# Patient Record
Sex: Female | Born: 1960 | Race: White | Hispanic: No | Marital: Single | Smoking: Never smoker
Health system: Southern US, Community
[De-identification: ages and names within clinical notes are randomized; demographics above are authoritative.]

## PROBLEM LIST (undated history)

## (undated) DIAGNOSIS — M1990 Unspecified osteoarthritis, unspecified site: Secondary | ICD-10-CM

## (undated) DIAGNOSIS — F32A Depression, unspecified: Secondary | ICD-10-CM

## (undated) DIAGNOSIS — M109 Gout, unspecified: Secondary | ICD-10-CM

## (undated) DIAGNOSIS — E039 Hypothyroidism, unspecified: Secondary | ICD-10-CM

## (undated) DIAGNOSIS — I10 Essential (primary) hypertension: Secondary | ICD-10-CM

## (undated) DIAGNOSIS — F419 Anxiety disorder, unspecified: Secondary | ICD-10-CM

## (undated) DIAGNOSIS — G4730 Sleep apnea, unspecified: Secondary | ICD-10-CM

## (undated) DIAGNOSIS — E7800 Pure hypercholesterolemia, unspecified: Secondary | ICD-10-CM

## (undated) DIAGNOSIS — D649 Anemia, unspecified: Secondary | ICD-10-CM

## (undated) DIAGNOSIS — M19049 Primary osteoarthritis, unspecified hand: Secondary | ICD-10-CM

## (undated) HISTORY — DX: Anemia, unspecified: D64.9

## (undated) HISTORY — DX: Sleep apnea, unspecified: G47.30

## (undated) HISTORY — PX: ABDOMINAL HYSTERECTOMY: SHX81

## (undated) HISTORY — DX: Depression, unspecified: F32.A

## (undated) HISTORY — DX: Unspecified osteoarthritis, unspecified site: M19.90

---

## 2001-08-16 ENCOUNTER — Encounter: Payer: Self-pay | Admitting: Emergency Medicine

## 2001-08-16 ENCOUNTER — Emergency Department (HOSPITAL_COMMUNITY): Admission: EM | Admit: 2001-08-16 | Discharge: 2001-08-16 | Payer: Self-pay | Admitting: Emergency Medicine

## 2014-08-16 ENCOUNTER — Emergency Department (HOSPITAL_COMMUNITY): Payer: 59

## 2014-08-16 ENCOUNTER — Emergency Department (HOSPITAL_COMMUNITY)
Admission: EM | Admit: 2014-08-16 | Discharge: 2014-08-16 | Disposition: A | Discharge status: Home / Self Care | Payer: 59 | Attending: Emergency Medicine | Admitting: Emergency Medicine

## 2014-08-16 ENCOUNTER — Encounter (HOSPITAL_COMMUNITY): Payer: Self-pay | Admitting: Emergency Medicine

## 2014-08-16 DIAGNOSIS — Y998 Other external cause status: Secondary | ICD-10-CM | POA: Diagnosis not present

## 2014-08-16 DIAGNOSIS — Y9389 Activity, other specified: Secondary | ICD-10-CM | POA: Insufficient documentation

## 2014-08-16 DIAGNOSIS — W2209XA Striking against other stationary object, initial encounter: Secondary | ICD-10-CM | POA: Diagnosis not present

## 2014-08-16 DIAGNOSIS — E782 Mixed hyperlipidemia: Secondary | ICD-10-CM | POA: Insufficient documentation

## 2014-08-16 DIAGNOSIS — S0990XA Unspecified injury of head, initial encounter: Secondary | ICD-10-CM | POA: Diagnosis not present

## 2014-08-16 DIAGNOSIS — Z79899 Other long term (current) drug therapy: Secondary | ICD-10-CM | POA: Diagnosis not present

## 2014-08-16 DIAGNOSIS — E860 Dehydration: Secondary | ICD-10-CM

## 2014-08-16 DIAGNOSIS — I10 Essential (primary) hypertension: Secondary | ICD-10-CM | POA: Insufficient documentation

## 2014-08-16 DIAGNOSIS — R55 Syncope and collapse: Secondary | ICD-10-CM

## 2014-08-16 DIAGNOSIS — Y9289 Other specified places as the place of occurrence of the external cause: Secondary | ICD-10-CM | POA: Diagnosis not present

## 2014-08-16 DIAGNOSIS — R197 Diarrhea, unspecified: Secondary | ICD-10-CM | POA: Insufficient documentation

## 2014-08-16 DIAGNOSIS — S0121XA Laceration without foreign body of nose, initial encounter: Secondary | ICD-10-CM | POA: Diagnosis not present

## 2014-08-16 DIAGNOSIS — W19XXXA Unspecified fall, initial encounter: Secondary | ICD-10-CM

## 2014-08-16 HISTORY — DX: Pure hypercholesterolemia, unspecified: E78.00

## 2014-08-16 HISTORY — DX: Essential (primary) hypertension: I10

## 2014-08-16 LAB — CBC WITH DIFFERENTIAL/PLATELET
BASOS PCT: 0 % (ref 0–1)
Basophils Absolute: 0 10*3/uL (ref 0.0–0.1)
EOS ABS: 0 10*3/uL (ref 0.0–0.7)
Eosinophils Relative: 0 % (ref 0–5)
HCT: 40.9 % (ref 36.0–46.0)
HEMOGLOBIN: 13.5 g/dL (ref 12.0–15.0)
LYMPHS ABS: 1.4 10*3/uL (ref 0.7–4.0)
Lymphocytes Relative: 14 % (ref 12–46)
MCH: 28.8 pg (ref 26.0–34.0)
MCHC: 33 g/dL (ref 30.0–36.0)
MCV: 87.2 fL (ref 78.0–100.0)
MONO ABS: 0.6 10*3/uL (ref 0.1–1.0)
MONOS PCT: 6 % (ref 3–12)
NEUTROS PCT: 80 % — AB (ref 43–77)
Neutro Abs: 7.6 10*3/uL (ref 1.7–7.7)
Platelets: 203 10*3/uL (ref 150–400)
RBC: 4.69 MIL/uL (ref 3.87–5.11)
RDW: 15.5 % (ref 11.5–15.5)
WBC: 9.6 10*3/uL (ref 4.0–10.5)

## 2014-08-16 LAB — COMPREHENSIVE METABOLIC PANEL
ALBUMIN: 3.8 g/dL (ref 3.5–5.0)
ALK PHOS: 58 U/L (ref 38–126)
ALT: 38 U/L (ref 14–54)
AST: 60 U/L — AB (ref 15–41)
Anion gap: 9 (ref 5–15)
BILIRUBIN TOTAL: 0.7 mg/dL (ref 0.3–1.2)
BUN: 8 mg/dL (ref 6–20)
CHLORIDE: 106 mmol/L (ref 101–111)
CO2: 27 mmol/L (ref 22–32)
Calcium: 9.1 mg/dL (ref 8.9–10.3)
Creatinine, Ser: 1.02 mg/dL — ABNORMAL HIGH (ref 0.44–1.00)
GFR calc Af Amer: >60 mL/min (ref >60)
Glucose, Bld: 110 mg/dL — ABNORMAL HIGH (ref 65–99)
POTASSIUM: 3.4 mmol/L — AB (ref 3.5–5.1)
SODIUM: 142 mmol/L (ref 135–145)
Total Protein: 6.1 g/dL — ABNORMAL LOW (ref 6.5–8.1)

## 2014-08-16 LAB — TROPONIN I: Troponin I: <0.03 ng/mL (ref <0.031)

## 2014-08-16 MED ORDER — SODIUM CHLORIDE 0.9 % IV SOLN: Freq: Once | INTRAVENOUS | Status: DC

## 2014-08-16 MED ORDER — MORPHINE SULFATE 4 MG/ML IJ SOLN
4.0000 mg | Freq: Once | INTRAMUSCULAR | Status: AC
  Administered 2014-08-16: 4 mg via INTRAVENOUS
  Filled 2014-08-16: qty 1

## 2014-08-16 MED ORDER — SODIUM CHLORIDE 0.9 % IV BOLUS (SEPSIS)
1000.0000 mL | Freq: Once | INTRAVENOUS | Status: AC
  Administered 2014-08-16: 1000 mL via INTRAVENOUS

## 2014-08-16 MED ORDER — HYDROCODONE-ACETAMINOPHEN 5-325 MG PO TABS: 1.0000 | ORAL_TABLET | ORAL | Status: DC | PRN

## 2014-08-16 MED ORDER — MORPHINE SULFATE 4 MG/ML IJ SOLN
4.0000 mg | INTRAMUSCULAR | Status: DC | PRN
  Administered 2014-08-16: 4 mg via INTRAVENOUS
  Filled 2014-08-16: qty 1

## 2014-08-16 MED ORDER — LIDOCAINE HCL (PF) 1 % IJ SOLN
30.0000 mL | Freq: Once | INTRAMUSCULAR | Status: AC
  Administered 2014-08-16: 30 mL
  Filled 2014-08-16: qty 30

## 2014-08-16 MED ORDER — ONDANSETRON HCL 4 MG/2ML IJ SOLN
4.0000 mg | Freq: Once | INTRAMUSCULAR | Status: AC
  Administered 2014-08-16: 4 mg via INTRAVENOUS
  Filled 2014-08-16: qty 2

## 2014-08-16 MED ORDER — ONDANSETRON 4 MG PO TBDP: 4.0000 mg | ORAL_TABLET | Freq: Three times a day (TID) | ORAL | Status: DC | PRN

## 2014-08-16 NOTE — ED Notes (Signed)
Received pt from home via ems with c/o been taking fenteramine for several weeks and loss 45 lbs. Pt has history of HTN but while taking this medication she began getting dizzy and was told by her doctor to stop taking her BP meds. Pt stopped taking her BP meds x 1 week. Pt has been experiencing diarrhea, abd cramping, dizziness, N/V. Today after getting up and going to the bathroom for a second time with diarrhea pt had a syncopal episode. Pt landed face down causing lac to her nose. Pt EKG for EMS showed T wave inversion.

## 2014-08-16 NOTE — ED Provider Notes (Signed)
CSN: [id number]     Arrival date & time 08/16/14  0706 History   First MD Initiated Contact with Patient 08/16/14 0719     Chief Complaint  Patient presents with  . Loss of Consciousness  . Emesis  . Head Injury      HPI  Patient presents for evaluation after a syncopal episode. Was getting up to the restroom for the second time this morning. Felt dizzy and lightheaded. Has felt this way intermittently for the last several days. Was on the toilet. Felt hot and flushed. She felt like she was going to pass out. She stood and tried to walk back to her bedroom. She fell in the hallway. Struck her face against a stationary box. Has an extensive nasal laceration. Husband heard the sound of her hitting the floor. He was immediately at her side. He states that it took her a few seconds to "realize what happened". However no frank ongoing loss of consciousness.  She has been on a diet for 12 weeks and on phentermine for 2 courses in the last 12 weeks. When on a diet of small circular protein and sponsoring and vegetables twice a day. The blood leg in the evening, magnesium oxide at night along with a "Colon Flushing". She states she has loose stools and abdominal cramping each morning. Has lost total 45 pounds.  She states that she had previous ability on antihypertensives, amlodipine. Her physician told her to check her blood pressures and that if they became lower she may not need it anymore with consideration for weight loss. Started feeling dizzy and lightheaded week ago. She stopped her amlodipine. However, she has not been checking blood pressures. Does not what her blood pressure has been running.  Past Medical History  Diagnosis Date  . Hypertension   . High cholesterol    Past Surgical History  Procedure Laterality Date  . Abdominal hysterectomy     No family history on file. History  Substance Use Topics  . Smoking status: Never Smoker   . Smokeless tobacco: Not on file  . Alcohol  Use: Yes   OB History    No data available     Review of Systems  Constitutional: Negative for fever, chills, diaphoresis, appetite change and fatigue.  HENT: Negative for mouth sores, sore throat and trouble swallowing.        Nasal laceration  Eyes: Negative for visual disturbance.  Respiratory: Negative for cough, chest tightness, shortness of breath and wheezing.   Cardiovascular: Negative for chest pain.  Gastrointestinal: Positive for nausea and diarrhea. Negative for vomiting, abdominal pain and abdominal distention.  Endocrine: Negative for polydipsia, polyphagia and polyuria.  Genitourinary: Negative for dysuria, frequency and hematuria.  Musculoskeletal: Negative for gait problem.  Skin: Positive for wound. Negative for color change, pallor and rash.  Neurological: Positive for dizziness, syncope and weakness. Negative for light-headedness and headaches.  Hematological: Does not bruise/bleed easily.  Psychiatric/Behavioral: Negative for behavioral problems and confusion.      Allergies  Review of patient's allergies indicates no known allergies.  Home Medications   Prior to Admission medications   Medication Sig Start Date End Date Taking? Authorizing Provider  atorvastatin (LIPITOR) 20 MG tablet Take 20 mg by mouth daily.   Yes Historical Provider, MD  escitalopram (LEXAPRO) 10 MG tablet Take 10 mg by mouth daily.   Yes Historical Provider, MD  magnesium oxide (MAG-OX) 400 MG tablet Take 400 mg by mouth daily.   Yes Historical Provider, MD  phentermine 37.5 MG capsule Take 37.5 mg by mouth every morning.   Yes Historical Provider, MD  HYDROcodone-acetaminophen (NORCO/VICODIN) 5-325 MG per tablet Take 1 tablet by mouth every 4 (four) hours as needed. 08/16/14   Tanna Furry, MD  ondansetron (ZOFRAN ODT) 4 MG disintegrating tablet Take 1 tablet (4 mg total) by mouth every 8 (eight) hours as needed for nausea. 08/16/14   Tanna Furry, MD   BP 121/89 mmHg  Pulse 94   Temp(Src) 97.5 F (36.4 C) (Oral)  Resp 22  Ht 5\' 3"  (1.6 m)  Wt 160 lb (72.576 kg)  BMI 28.35 kg/m2  SpO2 99% Physical Exam  Constitutional: She is oriented to person, place, and time. She appears well-developed and well-nourished. No distress. Cervical collar in place.  HENT:  Head: Normocephalic.  Nose:    No facial deformity. Nasal bones evident in the base of this nasal laceration. No proptosis or enophthalmos. Normal ocular movements. Normal V1 to V3 sensation bilaterally. No malocclusion or dental trauma.  Eyes: Conjunctivae are normal. Pupils are equal, round, and reactive to light. No scleral icterus.  Neck: Normal range of motion. Neck supple. No thyromegaly present.  No midline cervical spine tenderness.  Cardiovascular: Normal rate and regular rhythm.  Exam reveals no gallop and no friction rub.   No murmur heard. Regular rate and rhythm. Sinus rhythm on the monitor.  Pulmonary/Chest: Effort normal and breath sounds normal. No respiratory distress. She has no wheezes. She has no rales.  Abdominal: Soft. Bowel sounds are normal. She exhibits no distension. There is no tenderness. There is no rebound.  Musculoskeletal: Normal range of motion.  Neurological: She is alert and oriented to person, place, and time.  Normal neurological examination.  Skin: Skin is warm and dry. No rash noted.  Psychiatric: She has a normal mood and affect. Her behavior is normal.    ED Course  Procedures (including critical care time) Labs Review Labs Reviewed  CBC WITH DIFFERENTIAL/PLATELET - Abnormal; Notable for the following:    Neutrophils Relative % 80 (*)    All other components within normal limits  COMPREHENSIVE METABOLIC PANEL - Abnormal; Notable for the following:    Potassium 3.4 (*)    Glucose, Bld 110 (*)    Creatinine, Ser 1.02 (*)    Total Protein 6.1 (*)    AST 60 (*)    All other components within normal limits  TROPONIN I    Imaging Review Ct Head Wo  Contrast  08/16/2014   CLINICAL DATA:  Syncope.  Fall and landed on face  EXAM: CT HEAD WITHOUT CONTRAST  CT MAXILLOFACIAL WITHOUT CONTRAST  TECHNIQUE: Multidetector CT imaging of the head and maxillofacial structures were performed using the standard protocol without intravenous contrast. Multiplanar CT image reconstructions of the maxillofacial structures were also generated.  COMPARISON:  None.  FINDINGS: CT HEAD FINDINGS  There is prominence of the sulci. The ventricular volumes are normal. The cerebral and cerebellar hemispheres are normal. No evidence for acute intracranial hemorrhage, cortical infarct or mass. No abnormal extra-axial fluid collections. The paranasal sinuses are clear. The mastoid air cells are clear the calvarium is intact.  CT MAXILLOFACIAL FINDINGS  The paranasal sinuses are clear. The mandible appears intact. No F no evidence for nasal bone fracture. The nasal septum is midline. The orbits are intact. No blow-out fracture.  IMPRESSION: 1. No evidence for acute intracranial abnormality. 2. No facial bone injury identified.   Electronically Signed   By: Queen Slough.D.  On: 08/16/2014 08:53   Ct Cervical Spine Wo Contrast  08/16/2014   CLINICAL DATA:  54 year old female status post syncope and fall  EXAM: CT CERVICAL SPINE WITHOUT CONTRAST  TECHNIQUE: Multidetector CT imaging of the cervical spine was performed without intravenous contrast. Multiplanar CT image reconstructions were also generated.  COMPARISON:  Concurrently obtained radiographs of the head and face  FINDINGS: No acute fracture, malalignment or prevertebral soft tissue swelling. Left C2-C3 facet arthropathy. Mild facet arthropathy also present on the left at T1-T2. Mild cervical spondylosis with posterior disc osteophyte complexes at C5-C6 and C6-C7. Unremarkable CT appearance of the thyroid gland. No acute soft tissue abnormality. The lung apices are unremarkable.  IMPRESSION: 1. No acute fracture or malalignment.  2. Mild multilevel cervical spondylosis and left-sided facet arthropathy as above.   Electronically Signed   By: Jacqulynn Cadet M.D.   On: 08/16/2014 08:56   Ct Maxillofacial Wo Cm  08/16/2014   CLINICAL DATA:  Syncope.  Fall and landed on face  EXAM: CT HEAD WITHOUT CONTRAST  CT MAXILLOFACIAL WITHOUT CONTRAST  TECHNIQUE: Multidetector CT imaging of the head and maxillofacial structures were performed using the standard protocol without intravenous contrast. Multiplanar CT image reconstructions of the maxillofacial structures were also generated.  COMPARISON:  None.  FINDINGS: CT HEAD FINDINGS  There is prominence of the sulci. The ventricular volumes are normal. The cerebral and cerebellar hemispheres are normal. No evidence for acute intracranial hemorrhage, cortical infarct or mass. No abnormal extra-axial fluid collections. The paranasal sinuses are clear. The mastoid air cells are clear the calvarium is intact.  CT MAXILLOFACIAL FINDINGS  The paranasal sinuses are clear. The mandible appears intact. No F no evidence for nasal bone fracture. The nasal septum is midline. The orbits are intact. No blow-out fracture.  IMPRESSION: 1. No evidence for acute intracranial abnormality. 2. No facial bone injury identified.   Electronically Signed   By: Kerby Moors M.D.   On: 08/16/2014 08:53     EKG Interpretation   Date/Time:  Sunday August 16 2014 07:12:30 EDT Ventricular Rate:  57 PR Interval:  157 QRS Duration: 89 QT Interval:  505 QTC Calculation: 492 R Axis:   36 Text Interpretation:  Sinus rhythm Baseline wander in lead(s) V2 T  inversion V1-V3 Confirmed by Jeneen Rinks  MD, [place] ([id number]) on 08/16/2014 8:07:31  AM      MDM   Final diagnoses:  Fall  Nasal laceration, initial encounter   Her syncope is likely multifactorial. With the diarrhea daily over expect some degree of volume depletion. With the symptoms coming on with diarrhea and on the toilet this is likely micturition/vagal syncope.  Plan is hydration. Metabolic evaluation. Imaging. Will need nasal laceration repair.   LACERATION REPAIR Performed by: Lolita Patella Authorized by: Lolita Patella Consent: Verbal consent obtained. Risks and benefits: risks, benefits and alternatives were discussed Consent given by: patient Patient identity confirmed: provided demographic data Prepped and Draped in normal sterile fashion Wound explored  Laceration Location: nose  Laceration Length: 4cm  No Foreign Bodies seen or palpated  Anesthesia: local infiltration  Local anesthetic: lidocaine 1% c epinephrine  Anesthetic total: 4 ml  Irrigation method: syringe Amount of cleaning: standard  Skin closure: 5-0 Prolene  Number of sutures: 10  Technique: simple  Patient tolerance: Patient tolerated the procedure well with no immediate complications.  11:40:  After fluids patient feeling better. Ambulatory into the restroom a few times. X-ray showed no fractures or acute findings. Laceration repaired.  After recheck nasal tip and cartilage appear pink with excellent refill. I discussed gas the case with Dr. Buelah Manis of oral surgery. Ranges made for follow-up tomorrow as needed, or in 5-7 days for suture removal.     Tanna Furry, MD 08/20/14 [phone]

## 2014-08-16 NOTE — Discharge Instructions (Signed)
Call Dr. Buelah Manis for an outpatient appointment.  If your nose still appears pink and normal, call tomorrow to make an appointment on Friday or next Monday for suture removal.  If tomorrow, your nose appears white, pale, or dusky blue call Dr. Buelah Manis for an appointment tomorrow.  Do not use your supplements or bowel cleanse tonight.  Rest and stay hydrated.

## 2014-08-16 NOTE — ED Notes (Signed)
Family at bedside. 

## 2014-08-16 NOTE — ED Notes (Signed)
Suture cart at bedside 

## 2014-08-16 NOTE — ED Notes (Signed)
Patient transported to CT 

## 2015-08-29 IMAGING — CT CT MAXILLOFACIAL W/O CM
3 series · 16 of 47 positions shown, 19 images · non-contrast
Comparison: None.

CLINICAL DATA: Syncope.  Fall and landed on face

EXAM:
CT HEAD WITHOUT CONTRAST
CT MAXILLOFACIAL WITHOUT CONTRAST
TECHNIQUE: Multidetector CT imaging of the head and maxillofacial structures
were performed using the standard protocol without intravenous
contrast. Multiplanar CT image reconstructions of the maxillofacial
structures were also generated.

[Series 2: facial/ orbits 2.0 h30s · axial · 0.32mm/px · z∈[+193,+331]mm · 10 of 81 slices shown, 13 images]
[im 6/81  brain]
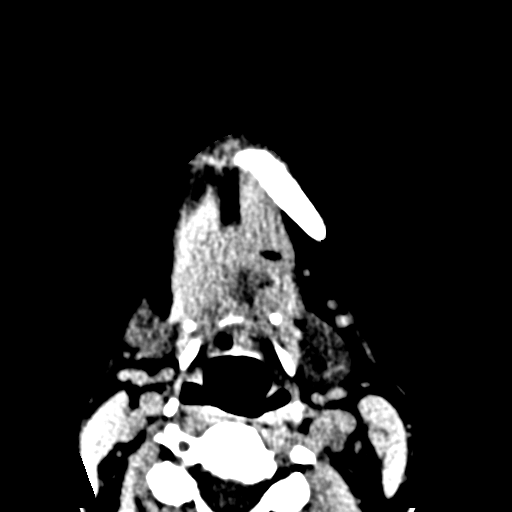
[im 6/81  bone]
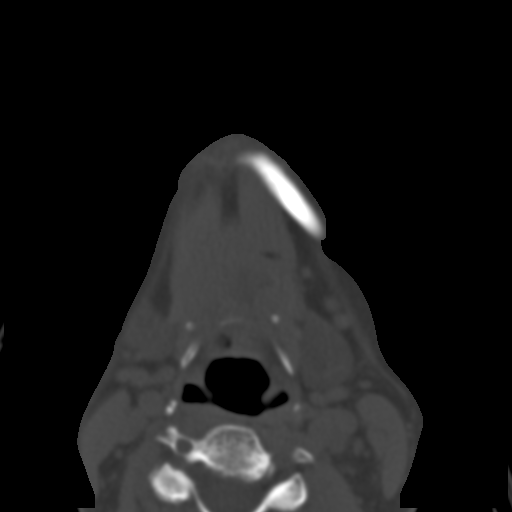
[im 14/81  bone]
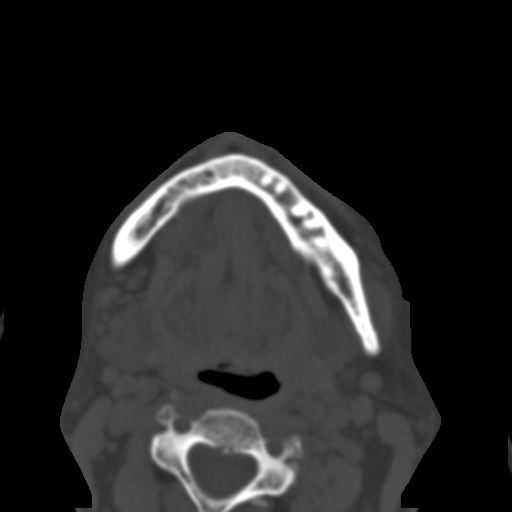
[im 23/81  bone]
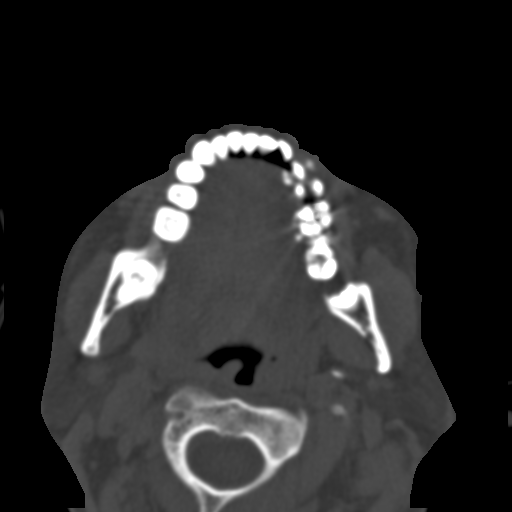
[im 28/81  bone]
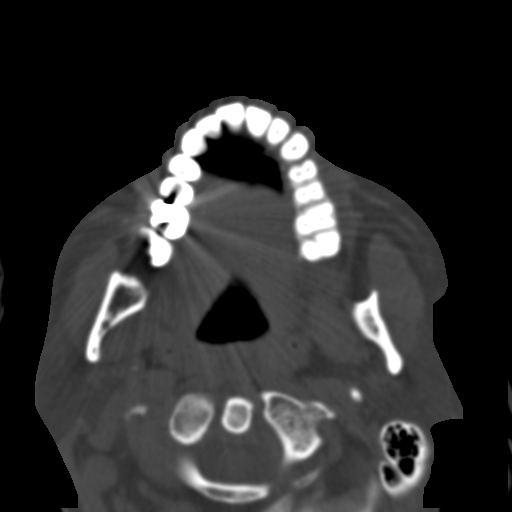
[im 36/81  brain]
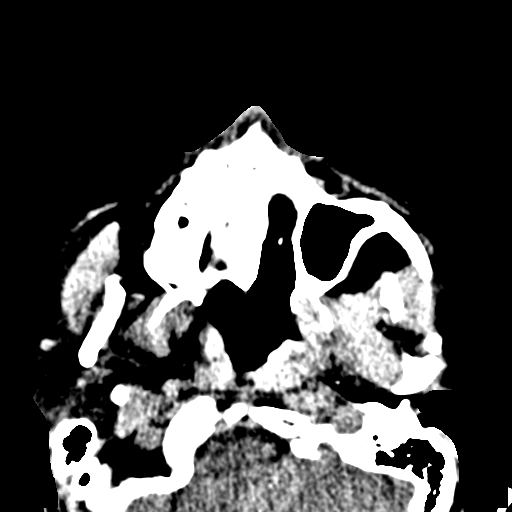
[im 36/81  bone]
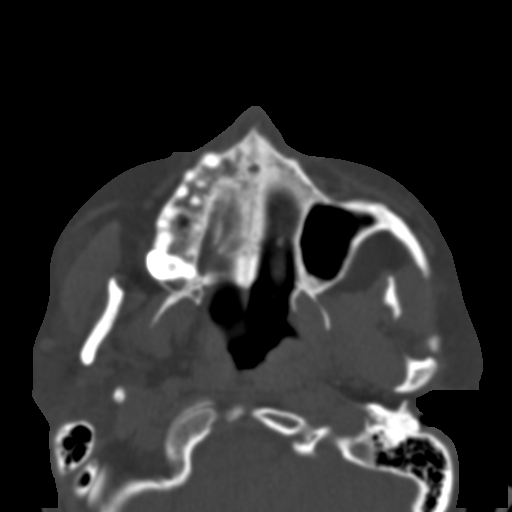
[im 45/81  bone]
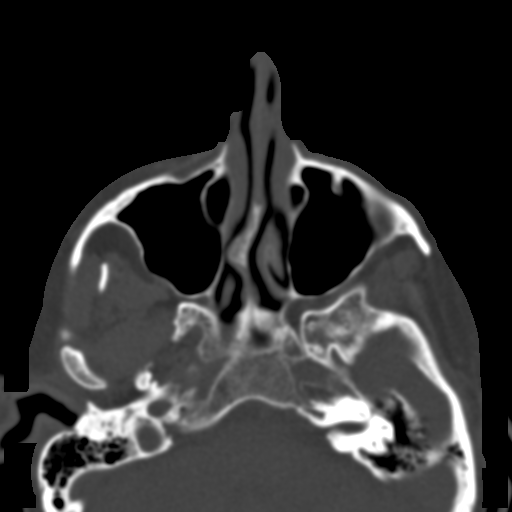
[im 53/81  bone]
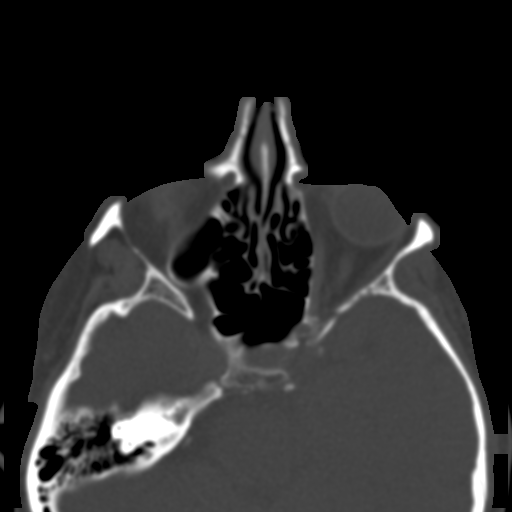
[im 61/81  bone]
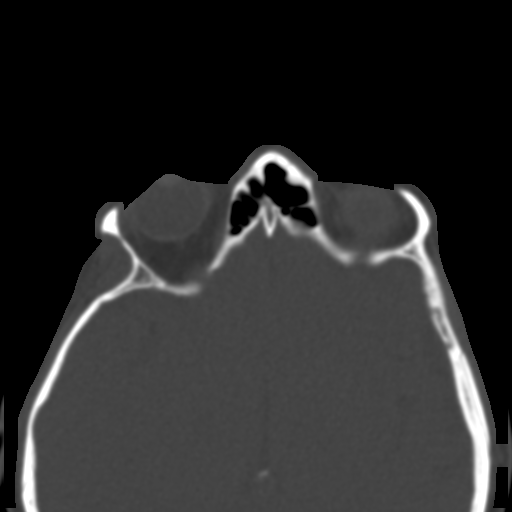
[im 67/81  brain]
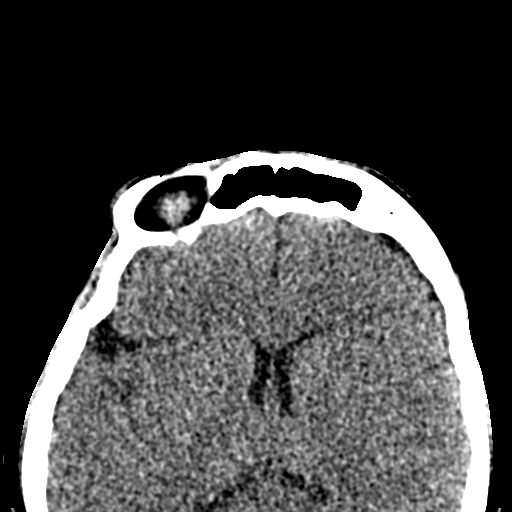
[im 67/81  bone]
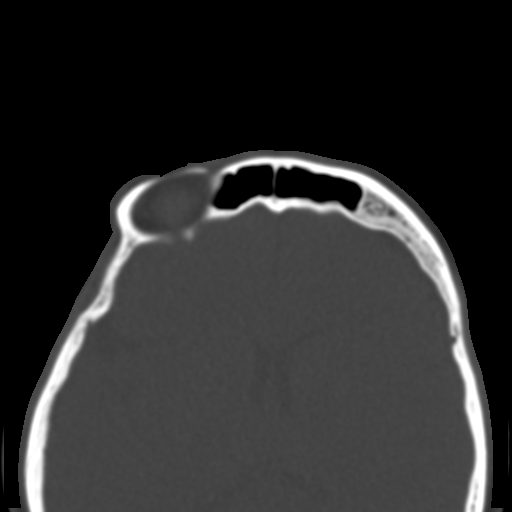
[im 75/81  bone]
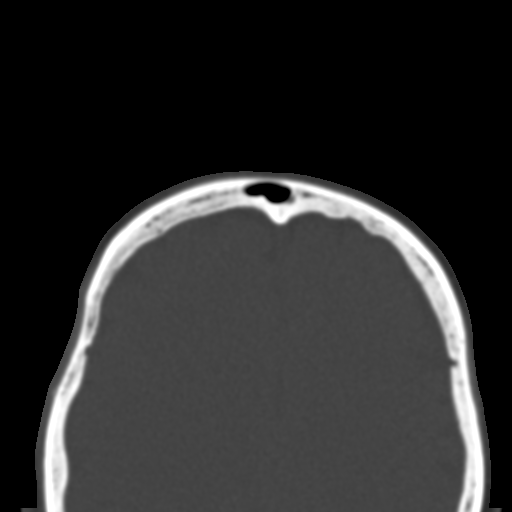

[Series 6: coronal soft tissue · coronal · 0.36mm/px · 3 of 74 slices shown]
[im 25/74  bone]
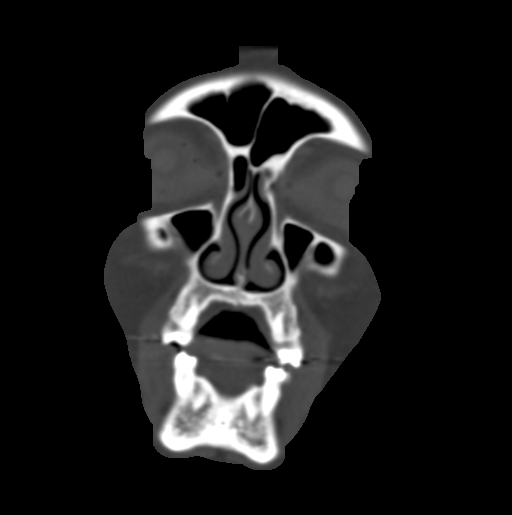
[im 33/74  bone]
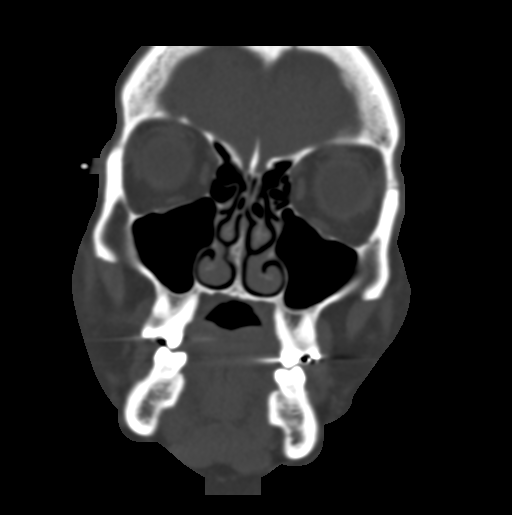
[im 41/74  bone]
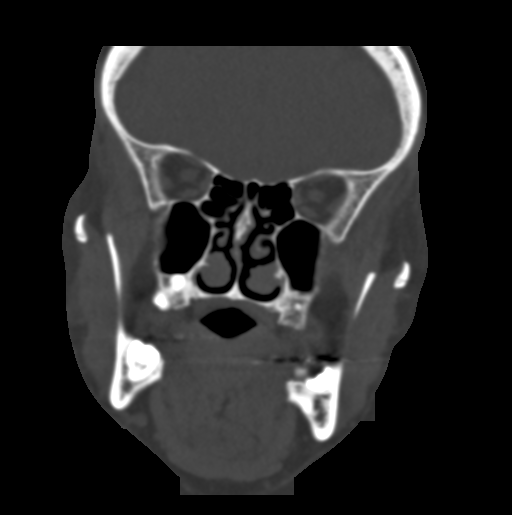

[Series 7: sagittal soft tissue · sagittal · 0.35mm/px · 3 of 76 slices shown]
[im 26/76  bone]
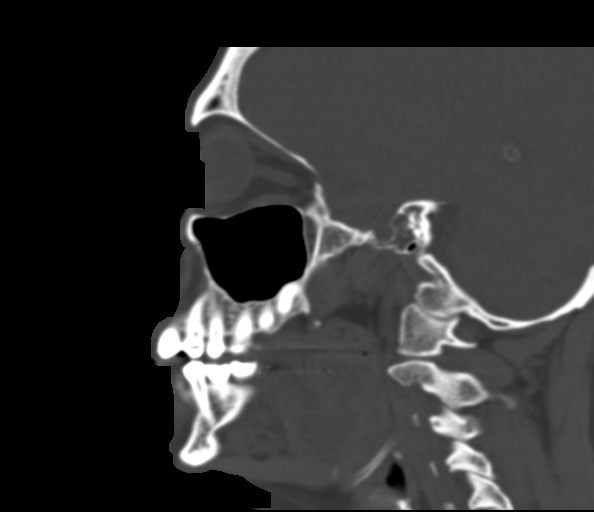
[im 38/76  bone]
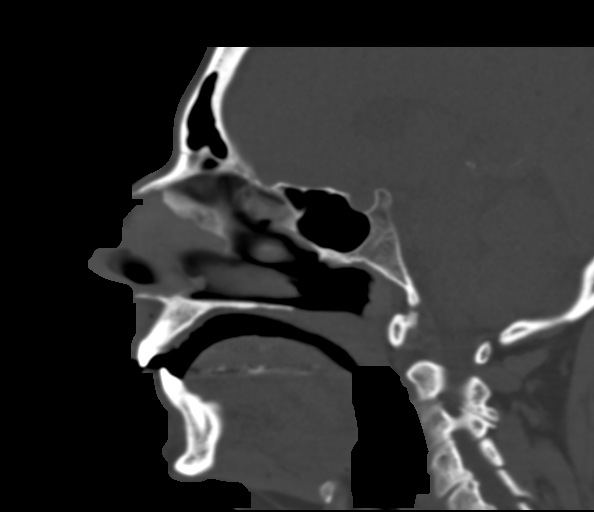
[im 51/76  bone]
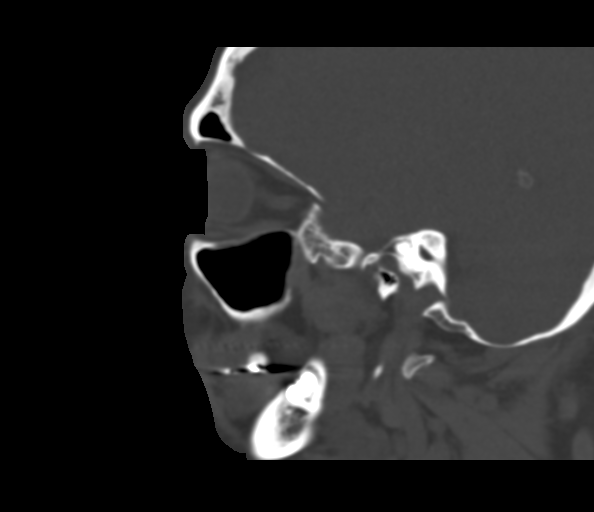

[16 of 47 positions shown; findings below may reference images not displayed]

FINDINGS: CT HEAD FINDINGS

There is prominence of the sulci. The ventricular volumes are
normal. The cerebral and cerebellar hemispheres are normal. No
evidence for acute intracranial hemorrhage, cortical infarct or
mass. No abnormal extra-axial fluid collections. The paranasal
sinuses are clear. The mastoid air cells are clear the calvarium is
intact.

CT MAXILLOFACIAL FINDINGS

The paranasal sinuses are clear. The mandible appears intact. No F
no evidence for nasal bone fracture. The nasal septum is midline.
The orbits are intact. No blow-out fracture.
IMPRESSION: 1. No evidence for acute intracranial abnormality.
2. No facial bone injury identified.

## 2015-08-29 IMAGING — CT CT CERVICAL SPINE W/O CM
3 of 4 series · 13 of 33 positions shown, 16 images · non-contrast
Comparison: Concurrently obtained radiographs of the head and face

CLINICAL DATA: 54-year-old female status post syncope and fall

EXAM:
CT CERVICAL SPINE WITHOUT CONTRAST
TECHNIQUE: Multidetector CT imaging of the cervical spine was performed without
intravenous contrast. Multiplanar CT image reconstructions were also
generated.

[Series 3: c_spine 2.0 i40s 3 · axial · 0.27mm/px · z∈[+147,+249]mm · 5 of 77 slices shown, 7 images]
[im 13/77  soft-tissue]
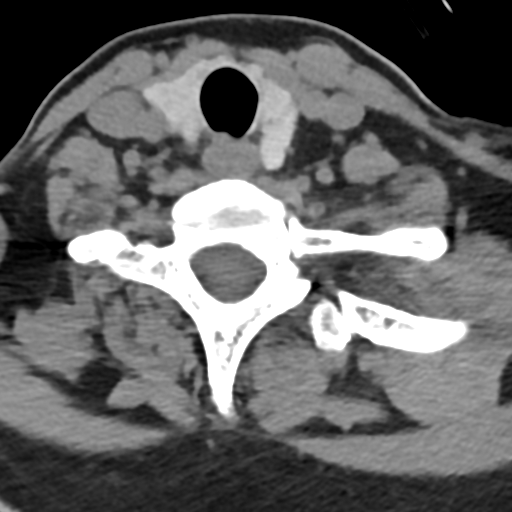
[im 13/77  bone]
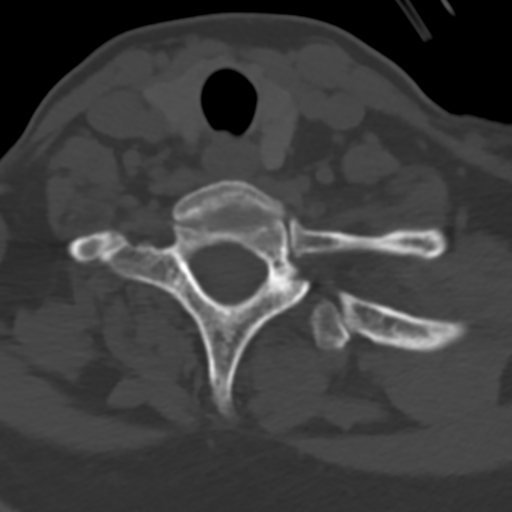
[im 26/77  bone]
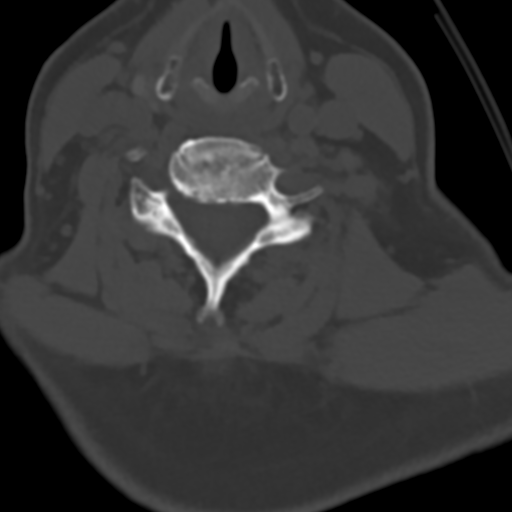
[im 39/77  bone]
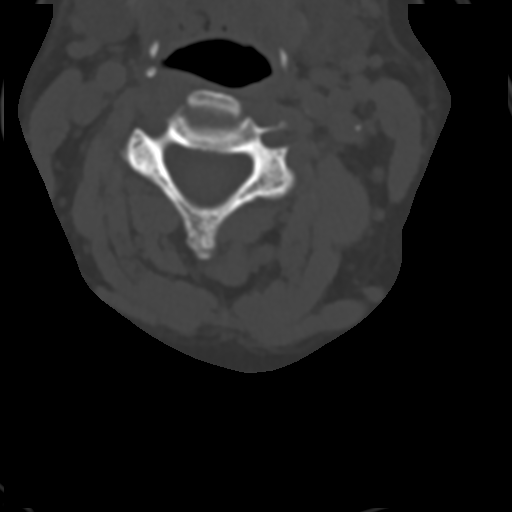
[im 51/77  bone]
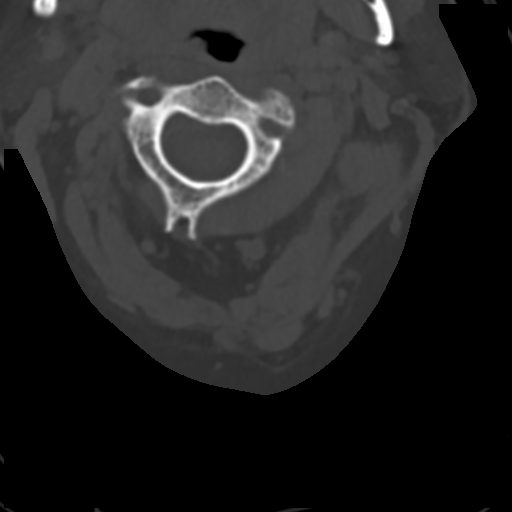
[im 64/77  soft-tissue]
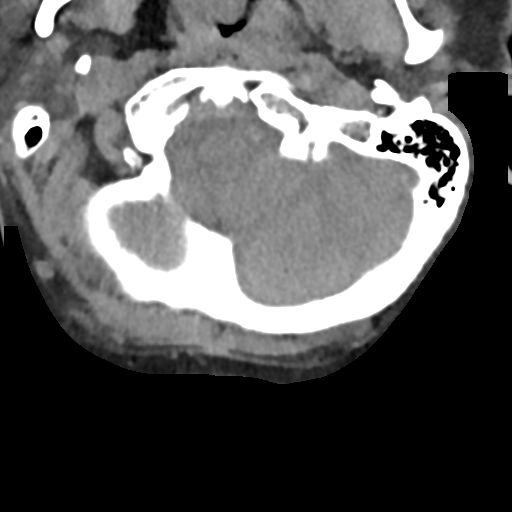
[im 64/77  bone]
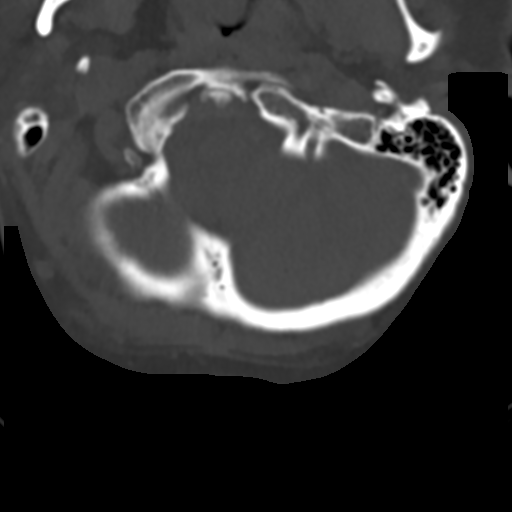

[Series 5: coronals · coronal · 0.28mm/px · 3 of 37 slices shown]
[im 8/37  bone]
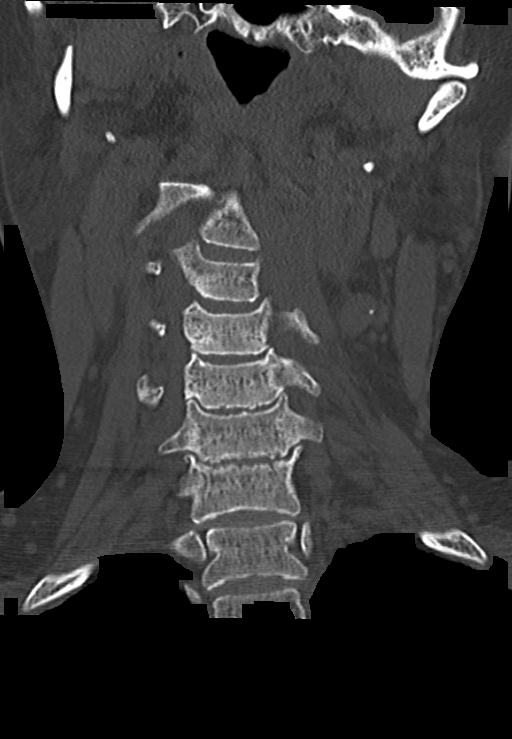
[im 15/37  bone]
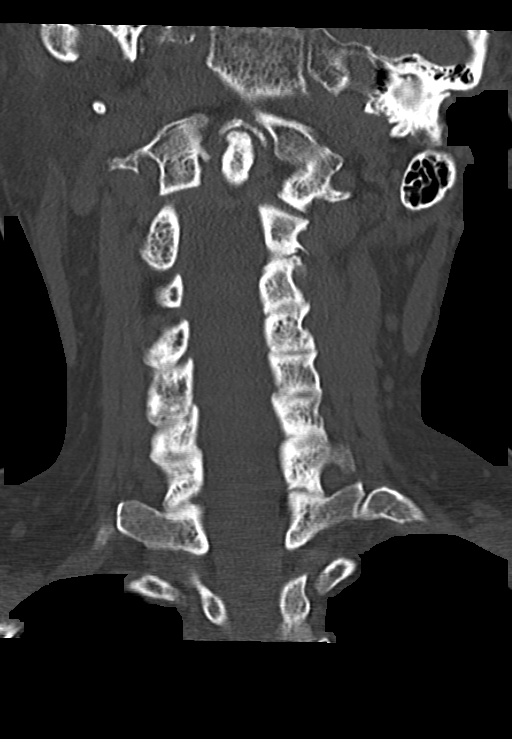
[im 22/37  bone]
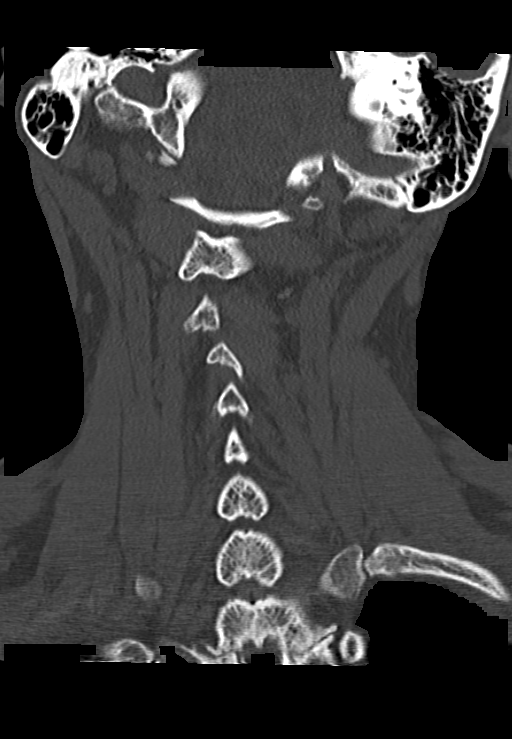

[Series 6: sagittals · sagittal · 0.30mm/px · 5 of 44 slices shown, 6 images]
[im 15/44  bone]
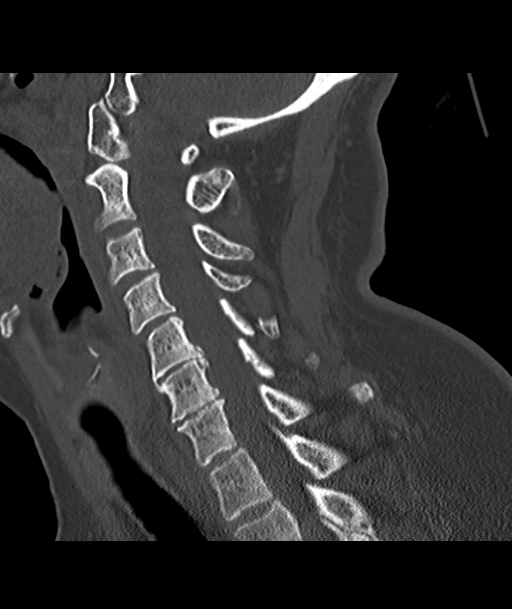
[im 18/44  bone]
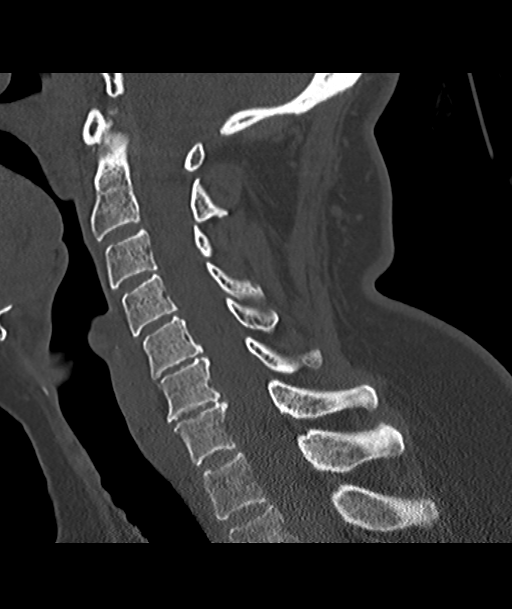
[im 22/44  soft-tissue]
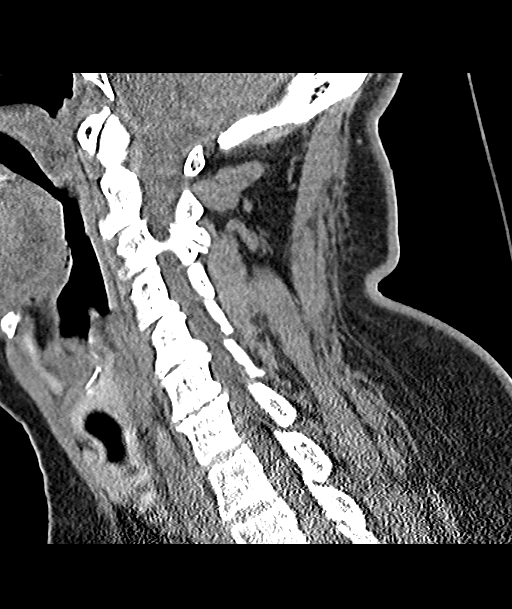
[im 22/44  bone]
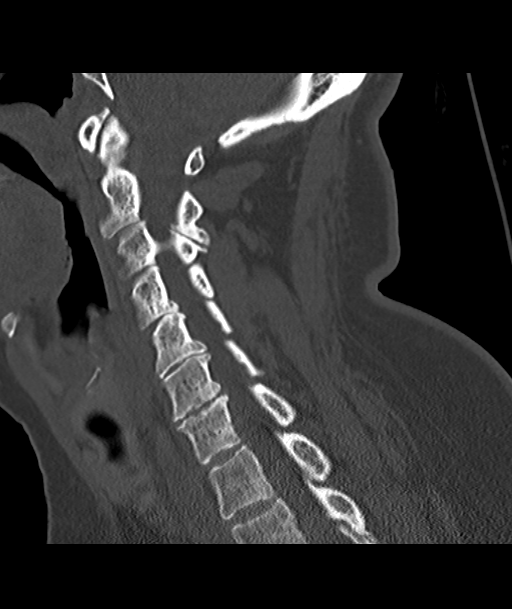
[im 26/44  bone]
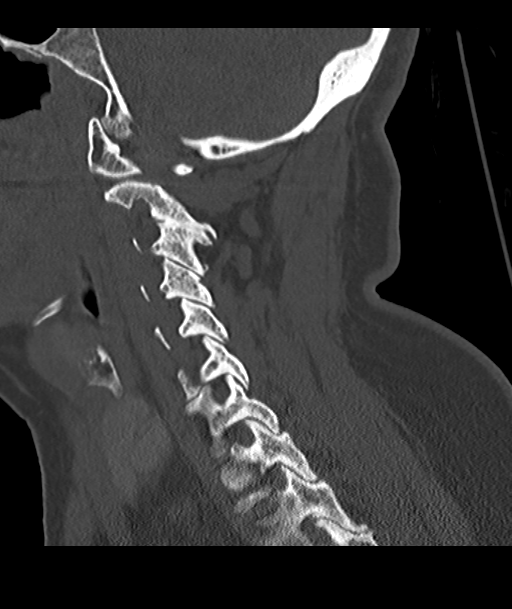
[im 29/44  bone]
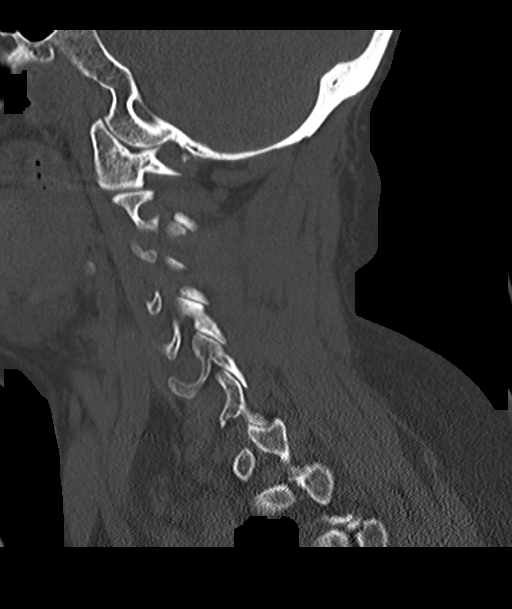

[13 of 33 positions shown; findings below may reference images not displayed]

FINDINGS: No acute fracture, malalignment or prevertebral soft tissue
swelling. Left C2-C3 facet arthropathy. Mild facet arthropathy also
present on the left at T1-T2. Mild cervical spondylosis with
posterior disc osteophyte complexes at C5-C6 and C6-C7. Unremarkable
CT appearance of the thyroid gland. No acute soft tissue
abnormality. The lung apices are unremarkable.
IMPRESSION: 1. No acute fracture or malalignment.
2. Mild multilevel cervical spondylosis and left-sided facet
arthropathy as above.

## 2017-01-30 HISTORY — PX: OTHER SURGICAL HISTORY: SHX169

## 2017-08-17 DIAGNOSIS — G5602 Carpal tunnel syndrome, left upper limb: Secondary | ICD-10-CM | POA: Insufficient documentation

## 2018-04-10 DIAGNOSIS — E785 Hyperlipidemia, unspecified: Secondary | ICD-10-CM | POA: Insufficient documentation

## 2018-04-10 DIAGNOSIS — F411 Generalized anxiety disorder: Secondary | ICD-10-CM | POA: Insufficient documentation

## 2018-09-25 DIAGNOSIS — M1812 Unilateral primary osteoarthritis of first carpometacarpal joint, left hand: Secondary | ICD-10-CM | POA: Insufficient documentation

## 2019-01-02 DIAGNOSIS — Z1321 Encounter for screening for nutritional disorder: Secondary | ICD-10-CM | POA: Insufficient documentation

## 2019-01-02 DIAGNOSIS — E538 Deficiency of other specified B group vitamins: Secondary | ICD-10-CM | POA: Insufficient documentation

## 2019-01-02 DIAGNOSIS — R7309 Other abnormal glucose: Secondary | ICD-10-CM | POA: Insufficient documentation

## 2020-02-05 ENCOUNTER — Other Ambulatory Visit: Payer: Self-pay

## 2020-02-05 ENCOUNTER — Ambulatory Visit (INDEPENDENT_AMBULATORY_CARE_PROVIDER_SITE_OTHER): Payer: 59 | Admitting: Family Medicine

## 2020-02-05 ENCOUNTER — Encounter: Payer: Self-pay | Admitting: Family Medicine

## 2020-02-05 VITALS — BP 162/82 | HR 72 | Ht 63.0 in | Wt 233.0 lb

## 2020-02-05 DIAGNOSIS — M79642 Pain in left hand: Secondary | ICD-10-CM

## 2020-02-05 DIAGNOSIS — F419 Anxiety disorder, unspecified: Secondary | ICD-10-CM

## 2020-02-05 DIAGNOSIS — E039 Hypothyroidism, unspecified: Secondary | ICD-10-CM

## 2020-02-05 DIAGNOSIS — I159 Secondary hypertension, unspecified: Secondary | ICD-10-CM

## 2020-02-05 DIAGNOSIS — E539 Vitamin B deficiency, unspecified: Secondary | ICD-10-CM

## 2020-02-05 DIAGNOSIS — F32A Depression, unspecified: Secondary | ICD-10-CM

## 2020-02-05 DIAGNOSIS — M109 Gout, unspecified: Secondary | ICD-10-CM

## 2020-02-05 DIAGNOSIS — Z808 Family history of malignant neoplasm of other organs or systems: Secondary | ICD-10-CM

## 2020-02-05 HISTORY — DX: Secondary hypertension, unspecified: I15.9

## 2020-02-05 MED ORDER — VENLAFAXINE HCL ER 150 MG PO CP24: 150.0000 mg | ORAL_CAPSULE | Freq: Every day | ORAL | 1 refills | Status: DC

## 2020-02-05 MED ORDER — CYANOCOBALAMIN 1000 MCG/ML IJ SOLN: 1000.0000 ug | Freq: Once | INTRAMUSCULAR | 0 refills | Status: AC

## 2020-02-05 NOTE — Progress Notes (Addendum)
New Patient Office Visit  Subjective:  Patient ID: Danielle Jones, female    DOB: August 06, 1960  Age: 60 y.o. MRN: [id number]  CC:  Chief Complaint  Patient presents with  . Establish Care    HPI Danielle Jones presents for chronic medical  Problems: HTN -not checking  on a regular basis-takesOlmesartan/Amlodipine/HCTZ-no recent blood work  Hypothyroid-taking levothyroxine-at current dose 79mg-no recent level  Gout-recent diagnosis-no recent flare-started taking allopurinol 1 weeks ago  Vit B deficiency-pt has taken 2 injections since diagnosis in December  Anxiety-long term use of Effexor ER 785m-no recent dosage change  Hand pain-gabapentin 3001mID, meloxicam 67m13mD-followed by ortho   Mammogram -scheduled for 02/06/20, no bone density in the past Past Medical History:  Diagnosis Date  . Depression   . High cholesterol   . Hypertension     Past Surgical History:  Procedure Laterality Date  . ABDOMINAL HYSTERECTOMY    . Carpal tunnel  2019  . thumb release  2019   Disabled child-acholic , [id number] family, [id number] -not in home, 15yo-grandson-legally adopted by family-stayed with family since 8yo,47yo grandchilden-11, 12yo49yo-grand-daughters-liveh other grandmother). pts mother in [id number] with husband-second husband-disability-stents placed in heart, pt with hand operation-ortho-sewer Family History  Problem Relation Age of Onset  . Arthritis Mother   . Alzheimer's disease Mother   . Kidney failure Mother   . Congenital heart disease Father   . Melanoma Father     Social History   Socioeconomic History  . Marital status: Single    Spouse name: Not on file  . Number of children: Not on file  . Years of education: Not on file  . Highest education level: Not on file  Occupational History  . Not on file  Tobacco Use  . Smoking status: Never Smoker  . Smokeless tobacco: Never Used  Substance and  Sexual Activity  . Alcohol use: Yes  . Drug use: No  . Sexual activity: Not on file  Other Topics Concern  . Not on file  Social History Narrative  . Not on file   Social Determinants of Health   Financial Resource Strain: Not on file  Food Insecurity: Not on file  Transportation Needs: Not on file  Physical Activity: Not on file  Stress: Not on file  Social Connections: Not on file  Intimate Partner Violence: Not on file    ROS Review of Systems  Constitutional: Negative.   HENT: Negative.        No glasses  Eyes: Negative.   Respiratory: Negative.   Cardiovascular: Negative.   Gastrointestinal: Negative.   Endocrine:       Levothyroxine  Genitourinary:       Hysterectomy-total-  Musculoskeletal:       Left hand/wrist-2019 repair/arthritis-unable to have FROM Pt right handed. Arthritis, early stages of carpal tunnel  Skin:       Right shoulder -lesion removal-benign  Allergic/Immunologic: Negative.   Neurological: Negative.   Hematological: Negative.   Psychiatric/Behavioral:       Depression and anxiety    Objective:   Today's Vitals: BP (!) 162/82   Pulse 72   Ht _0  (1.6 m)   Wt 233 lb (105.7 kg)   SpO2 98%   BMI 41.27 kg/m   Physical Exam Constitutional:      Appearance: Normal appearance.  HENT:     Head: Normocephalic and atraumatic.  Cardiovascular:     Rate and Rhythm: Normal  rate and regular rhythm.     Pulses: Normal pulses.     Heart sounds: Normal heart sounds.  Pulmonary:     Effort: Pulmonary effort is normal.     Breath sounds: Normal breath sounds.  Musculoskeletal:     Cervical back: Normal range of motion and neck supple.  Neurological:     Mental Status: She is alert and oriented to person, place, and time.  Psychiatric:        Mood and Affect: Mood normal.        Behavior: Behavior normal.     Assessment & Plan:  1. Gout, unspecified cause, unspecified chronicity, unspecified site Pt recently started on allopurinol  130m 1 month ago-mother with severe gout.  - Uric Acid - CBC with Differential  2. Secondary hypertension Pt has not been taking blood pressure readings at home-pt has been taking medications daily but has been under a large amount of stress - CMP14+EGFR  3. Vitamin B deficiency Pt has been given 2 -IM injections of B12 and is due for her third injection-she understands she needs to pick this up at the pharmacy and return. She would need 1 additional weekly injection then monthly injections or transfer to oral B12  4. Hypothyroidism, unspecified type Pt has been taking Levothyroid  for one year-she has not had her thyroid levels checked recently - TSH  5. Anxiety Pt has taken Lexapro in the past-pt states she has been on Effexor 739mfor at least one year. Pt has not had counseling as part of treatment-multiple stressors-child, grandchild, husband, loss of work due to injury-carpal tunnel. Little outside interest-stays at home. Little exercise or contact with others. GAD-18-increase dose to 15081mounseling referral - Ambulatory referral to Psychology  6. Depression, unspecified depression type PHQ9-20-concern for severe depression with multiple stressors. Increase Effexor to 150m49mounseling recommended. Contract for safety  7. Family history of malignant melanoma Father with melanoma-pt had a lesion removed-benign -no recent follow up - Ambulatory referral to Dermatology  8. Hand pain, left Carpal tunnel surgery was not successful-pt has less pain since she no longer works. Pt takes gabapentin BID and mobic BID-understands risk-CBC -bleed potential.   Outpatient Encounter Medications as of 02/05/2020  Medication Sig  . allopurinol (ZYLOPRIM) 100 MG tablet Take by mouth.  . atMarland Kitchenrvastatin (LIPITOR) 20 MG tablet Take 20 mg by mouth daily.  . ergocalciferol (VITAMIN D2) 1.25 MG (50000 UT) capsule TAKE 1 CAPSULE BY MOUTH 1 TIME A WEEK FOR 12 DOSES  . escitalopram (LEXAPRO) 10 MG tablet  Take 10 mg by mouth daily.  . gaMarland Kitchenapentin (NEURONTIN) 300 MG capsule TAKE 1 CAPSULE(300 MG) BY MOUTH TWICE DAILY AS NEEDED  . HYDROcodone-acetaminophen (NORCO/VICODIN) 5-325 MG per tablet Take 1 tablet by mouth every 4 (four) hours as needed.  . leMarland Kitchenothyroxine (SYNTHROID) 50 MCG tablet TAKE 1 TABLET(50 MCG) BY MOUTH DAILY  . meloxicam (MOBIC) 15 MG tablet TAKE 1/2 TABLET(7.5 MG) BY MOUTH TWICE DAILY AS NEEDED FOR PAIN  . venlafaxine XR (EFFEXOR-XR) 75 MG 24 hr capsule TAKE 1 CAPSULE(75 MG) BY MOUTH DAILY  . Olmesartan-amLODIPine-HCTZ 40-5-25 MG TABS Take 1 tablet by mouth daily.  . [DISCONTINUED] magnesium oxide (MAG-OX) 400 MG tablet Take 400 mg by mouth daily.  . [DISCONTINUED] ondansetron (ZOFRAN ODT) 4 MG disintegrating tablet Take 1 tablet (4 mg total) by mouth every 8 (eight) hours as needed for nausea.  . [DISCONTINUED] phentermine 37.5 MG capsule Take 37.5 mg by mouth every morning.   No facility-administered encounter  medications on file as of 02/05/2020.   75 minutes spent with patient-history, medication review , physical exam, discussion about severe depression and anxiety, concern for FH melanoma and need for derm full body survey, need for B12 injections and oral replacement, encouragement for exercise to improvemood Follow-up: keep mammogram appt Derm referral -+FH melanoma , counseling referral-concern for severe depression and anxiety, keep mammogram appointment tomorrow. F/u in 6 weeks-increase in effexor, recheck blood pressure 02/06/19-uric acid 7.0-goal 6.0-increase allopurinol to 276m Danielle Jones LHannah Beat MD

## 2020-02-05 NOTE — Patient Instructions (Addendum)
Melanoma Melanoma is a form of skin cancer that begins in the skin cells that produce pigment (melanocytes). Melanoma usually starts as a mole or growth (lesion) on the skin and can spread to other areas of the body. If found early and treated, many cases of melanoma are curable. What are the causes? The exact cause of this condition is unknown. What increases the risk? The following factors may make you more likely to develop this condition:  Spending a lot of time in the sun, under a sunlamp, or in a tanning booth.  Having sunburn that blisters. The more blistering sunburns you have had, the higher your risk for melanoma becomes.  Spending time in parts of the world where sunlight is intense.  Living in a hot, sunny climate.  Having fair skin that does not tan easily.  Having had melanoma before.  Having a family history of melanoma.  Having many skin moles or unusual moles (dysplastic nevi).  Having a weakened immune system. What are the signs or symptoms? Symptoms of this condition include:  ABCDE changes in a mole. ABCDE stands for: ? Asymmetry. This means the mole has an irregular shape. It is not round or oval. ? Border. This means the mole has an irregular or bumpy border. ? Color. This means the mole has multiple colors in it, including brown, black, blue, red, or tan. ? Diameter. This means the mole is more than 0.2 in (6 mm) across. ? Evolving. This refers to any unusual changes or symptoms in the mole, such as pain, itching, stinging, sensitivity, or bleeding.  A new mole or skin lesion. Symptoms of melanoma that may have spread to other areas of the body include:  Swollen lymph nodes.  Shortness of breath.  Bone pain.  Headache.  Seizures.  Visual problems. How is this diagnosed? This condition is diagnosed by examining a tissue sample from the mole (biopsy). The biopsy will reveal whether melanoma has spread to deeper layers of the skin. You may also  have other tests, including:  Blood tests.  Chest X-rays.  Sentinel node biopsy.  A CT scan.  A bone scan. How is this treated? This condition is treated with surgery to remove the cancer as well as some of the tissue around where the cancer was found. Your lymph nodes may also be removed during the surgery. If melanoma has spread to other areas, such as the lymph nodes, liver, lungs, bone, or brain, you will need additional treatment, which may include:  Chemotherapy. This treatment uses medicine to kill cancer cells.  Radiation therapy. This treatment uses high-energy rays to kill cancer cells.  Targeted therapy. This treatment uses medicines that target specific genetic mutations linked to melanoma.  Biological therapy (immunotherapy). This treatment acts on the immune system to help kill cancer cells. A combination of surgery and other treatments may be recommended. Melanoma can come back after treatment (recur). Follow these instructions at home: Medicines  Take over-the-counter and prescription medicines only as told by your health care provider.  Talk with your health care provider before taking vitamins, supplements, and over-the-counter medicines. Some of these can interfere with chemotherapy. Eating and drinking  Drink enough fluid to keep your urine pale yellow.  Talk to a dietitian about what you should eat and drink during cancer treatment.  If you have side effects that affect eating, it may help to: ? Eat smaller meals and snacks often. ? Drink high-nutrition and high-calorie shakes or supplements. ? Eat bland and  soft foods that are easy to eat. ? Do not eat foods that are hot, spicy, or difficult to swallow. Lifestyle   Stay out of the sun, especially during peak mid-afternoon hours. Take the following precautions every day, even on cloudy days and in the winter, and even if you will be outdoors for only a short period of time. ? Wear long sleeves, a hat,  and sunglasses that block UV light when possible. ? Regularly apply a sunscreen with an SPF of 30 or higher. ? Consider cosmetics that contain an SPF. ? Limit sun exposure in the middle part of the day.  Get regular exercise, such as walking, gentle yoga, or tai chi.  Do not use any products that contain nicotine or tobacco, such as cigarettes and e-cigarettes. If you need help quitting, ask your health care provider.  Consider joining a support group. Ask your health care provider to recommend support groups online or in your community. General instructions  Learn as much as you can about your condition.  If you lose your hair, wear a wig, hat, or scarf to cover your head.  Meet with a hair and skin care specialist for makeup and skin care tips.  Work with your health care provider to manage side effects of treatment.  Keep all follow-up visits as told by your health care provider. This is important. You will need to have regular visits during and after treatment as a part of your care. Where to find more information  [place]: https://www.cancer.gov  American Cancer Society: http://www.cancer.org Contact a health care provider if:  You notice any new growths or changes in your skin.  You have had melanoma removed and you notice a new growth in the same location.  You have any new pain or new symptoms. Get help right away if:  You have a seizure.  You have chest pain.  You have trouble breathing.  You have a fever.  You have bleeding that does not stop. Summary  Melanoma is a form of skin cancer that begins in the skin cells that produce pigment (melanocytes). Melanoma usually starts as a mole or growth (lesion) on the skin and can spread to other areas of the body.  This condition is diagnosed by examining a tissue sample from a mole (biopsy).  If found early and treated, many cases of melanoma are curable.  A combination of surgery and other  treatments may be recommended.  Keep all follow-up visits as told by your health care provider. This is important. You will need to have regular visits during and after treatment as a part of your care. This information is not intended to replace advice given to you by your health care provider. Make sure you discuss any questions you have with your health care provider. Document Revised: 01/29/2017 Document Reviewed: 01/29/2017 Elsevier Patient Education  2020 [employer]. Depression Screening Depression screening is a tool that your health care provider can use to learn if you have symptoms of depression. Depression is a common condition with many symptoms that are also often found in other conditions. Depression is treatable, but it must first be diagnosed. You may not know that certain feelings, thoughts, and behaviors that you are having can be symptoms of depression. Taking a depression screening test can help you and your health care provider decide if you need more assessment, or if you should be referred to a mental health care provider. What are the screening tests?  You may have  a physical exam to see if another condition is affecting your mental health. You may have a blood or urine sample taken during the physical exam.  You may be interviewed using a screening tool that was developed from research, such as one of these: ? Patient Health Questionnaire (PHQ). This is a set of either 2 or 9 questions. A health care provider who has been trained to score this screening test uses a guide to assess if your symptoms suggest that you may have depression. ? Hamilton Depression Rating Scale (HAM-D). This is a set of either 17 or 24 questions. You may be asked to take it again during or after your treatment, to see if your depression has gotten better. ? Beck Depression Inventory (BDI). This is a set of 21 multiple choice questions. Your health care provider scores your answers to assess:  Your  level of depression, ranging from mild to severe.  Your response to treatment.  Your health care provider may talk with you about your daily activities, such as eating, sleeping, work, and recreation, and ask if you have had any changes in activity.  Your health care provider may ask you to see a mental health specialist, such as a psychiatrist or psychologist, for more evaluation. Who should be screened for depression?   All adults, including adults with a family history of a mental health disorder.  Adolescents who are 55-92 years old.  People who are recovering from a myocardial infarction (MI).  Pregnant women, or women who have given birth.  People who have a long-term (chronic) illness.  Anyone who has been diagnosed with another type of a mental health disorder.  Anyone who has symptoms that could show depression. What do my results mean? Your health care provider will review the results of your depression screening, physical exam, and lab tests. Positive screens suggest that you may have depression. Screening is the first step in getting the care that you may need. It is up to you to get your screening results. Ask your health care provider, or the department that is doing your screening tests, when your results will be ready. Talk with your health care provider about your results and diagnosis. A diagnosis of depression is made using the Diagnostic and Statistical Manual of Mental Disorders (DSM-V). This is a book that lists the number and type of symptoms that must be present for a health care provider to give a specific diagnosis.  Your health care provider may work with you to treat your symptoms of depression, or your health care provider may help you find a mental health provider who can assess, diagnose, and treat your depression. Get help right away if:  You have thoughts about hurting yourself or others. If you ever feel like you may hurt yourself or others, or have  thoughts about taking your own life, get help right away. You can go to your nearest emergency department or call:  Your local emergency services (911 in the [place]  A suicide crisis helpline, such as the [facility] at (867)144-1585. This is open 24 hours a day. Summary  Depression screening is the first step in getting the help that you may need.  If your screening test shows symptoms of depression (is positive), your health care provider may ask you to see a mental health provider.  Anyone who is age 7 or older should be screened for depression. This information is not intended to replace advice given to you by your  health care provider. Make sure you discuss any questions you have with your health care provider. Document Revised: 12/29/2016 Document Reviewed: 06/02/2016 Elsevier Patient Education  [place], Adult After being diagnosed with an anxiety disorder, you may be relieved to know why you have felt or behaved a certain way. You may also feel overwhelmed about the treatment ahead and what it will mean for your life. With care and support, you can manage this condition and recover from it. How to manage lifestyle changes Managing stress and anxiety  Stress is your body's reaction to life changes and events, both good and bad. Most stress will last just a few hours, but stress can be ongoing and can lead to more than just stress. Although stress can play a major role in anxiety, it is not the same as anxiety. Stress is usually caused by something external, such as a deadline, test, or competition. Stress normally passes after the triggering event has ended.  Anxiety is caused by something internal, such as imagining a terrible outcome or worrying that something will go wrong that will devastate you. Anxiety often does not go away even after the triggering event is over, and it can become long-term (chronic) worry. It is important to  understand the differences between stress and anxiety and to manage your stress effectively so that it does not lead to an anxious response. Talk with your health care provider or a counselor to learn more about reducing anxiety and stress. He or she may suggest tension reduction techniques, such as:  Music therapy. This can include creating or listening to music that you enjoy and that inspires you.  Mindfulness-based meditation. This involves being aware of your normal breaths while not trying to control your breathing. It can be done while sitting or walking.  Centering prayer. This involves focusing on a word, phrase, or sacred image that means something to you and brings you peace.  Deep breathing. To do this, expand your stomach and inhale slowly through your nose. Hold your breath for 3-5 seconds. Then exhale slowly, letting your stomach muscles relax.  Self-talk. This involves identifying thought patterns that lead to anxiety reactions and changing those patterns.  Muscle relaxation. This involves tensing muscles and then relaxing them. Choose a tension reduction technique that suits your lifestyle and personality. These techniques take time and practice. Set aside 5-15 minutes a day to do them. Therapists can offer counseling and training in these techniques. The training to help with anxiety may be covered by some insurance plans. Other things you can do to manage stress and anxiety include:  Keeping a stress/anxiety diary. This can help you learn what triggers your reaction and then learn ways to manage your response.  Thinking about how you react to certain situations. You may not be able to control everything, but you can control your response.  Making time for activities that help you relax and not feeling guilty about spending your time in this way.  Visual imagery and yoga can help you stay calm and relax.  Medicines Medicines can help ease symptoms. Medicines for anxiety  include:  Anti-anxiety drugs.  Antidepressants. Medicines are often used as a primary treatment for anxiety disorder. Medicines will be prescribed by a health care provider. When used together, medicines, psychotherapy, and tension reduction techniques may be the most effective treatment. Relationships Relationships can play a big part in helping you recover. Try to spend more time connecting with trusted friends and family members.  Consider going to couples counseling, taking family education classes, or going to family therapy. Therapy can help you and others better understand your condition. How to recognize changes in your anxiety Everyone responds differently to treatment for anxiety. Recovery from anxiety happens when symptoms decrease and stop interfering with your daily activities at home or work. This may mean that you will start to:  Have better concentration and focus. Worry will interfere less in your daily thinking.  Sleep better.  Be less irritable.  Have more energy.  Have improved memory. It is important to recognize when your condition is getting worse. Contact your health care provider if your symptoms interfere with home or work and you feel like your condition is not improving. Follow these instructions at home: Activity  Exercise. Most adults should do the following: ? Exercise for at least 150 minutes each week. The exercise should increase your heart rate and make you sweat (moderate-intensity exercise). ? Strengthening exercises at least twice a week.  Get the right amount and quality of sleep. Most adults need 7-9 hours of sleep each night. Lifestyle   Eat a healthy diet that includes plenty of vegetables, fruits, whole grains, low-fat dairy products, and lean protein. Do not eat a lot of foods that are high in solid fats, added sugars, or salt.  Make choices that simplify your life.  Do not use any products that contain nicotine or tobacco, such as  cigarettes, e-cigarettes, and chewing tobacco. If you need help quitting, ask your health care provider.  Avoid caffeine, alcohol, and certain over-the-counter cold medicines. These may make you feel worse. Ask your pharmacist which medicines to avoid. General instructions  Take over-the-counter and prescription medicines only as told by your health care provider.  Keep all follow-up visits as told by your health care provider. This is important. Where to find support You can get help and support from these sources:  Self-help groups.  Online and [employer].  A trusted spiritual leader.  Couples counseling.  Family education classes.  Family therapy. Where to find more information You may find that joining a support group helps you deal with your anxiety. The following sources can help you locate counselors or support groups near you:  Mental Health America: www.mentalhealthamerica.net  Anxiety and Depression Association of [place] (ADAA): ProgramCam.de  [employer] on Mental Illness (NAMI): www.nami.org Contact a health care provider if you:  Have a hard time staying focused or finishing daily tasks.  Spend many hours a day feeling worried about everyday life.  Become exhausted by worry.  Start to have headaches, feel tense, or have nausea.  Urinate more than normal.  Have diarrhea. Get help right away if you have:  A racing heart and shortness of breath.  Thoughts of hurting yourself or others. If you ever feel like you may hurt yourself or others, or have thoughts about taking your own life, get help right away. You can go to your nearest emergency department or call:  Your local emergency services (911 in the [place]  A suicide crisis helpline, such as the National Suicide Prevention Lifeline at 580 467 8843. This is open 24 hours a day. Summary  Taking steps to learn and use tension reduction techniques can help calm you and help prevent  triggering an anxiety reaction.  When used together, medicines, psychotherapy, and tension reduction techniques may be the most effective treatment.  Family, friends, and partners can play a big part in helping you recover from an anxiety disorder. This  information is not intended to replace advice given to you by your health care provider. Make sure you discuss any questions you have with your health care provider. Document Revised: 06/18/2018 Document Reviewed: 06/18/2018 Elsevier Patient Education  [place].  Effexor increase dose-75mg -increase dose to 150mg   Continue taking Vit B-go to pharmacy for prescription and bring back for injection  Blood work today-will call on results  Check blood pressure at home

## 2020-02-06 ENCOUNTER — Ambulatory Visit (INDEPENDENT_AMBULATORY_CARE_PROVIDER_SITE_OTHER): Payer: 59

## 2020-02-06 DIAGNOSIS — E539 Vitamin B deficiency, unspecified: Secondary | ICD-10-CM | POA: Diagnosis not present

## 2020-02-06 LAB — CBC WITH DIFFERENTIAL/PLATELET
Basophils Absolute: 0.1 10*3/uL (ref 0.0–0.2)
Basos: 2 %
EOS (ABSOLUTE): 0.3 10*3/uL (ref 0.0–0.4)
Eos: 4 %
Hematocrit: 37.9 % (ref 34.0–46.6)
Hemoglobin: 12.4 g/dL (ref 11.1–15.9)
Immature Grans (Abs): 0 10*3/uL (ref 0.0–0.1)
Immature Granulocytes: 0 %
Lymphocytes Absolute: 2.2 10*3/uL (ref 0.7–3.1)
Lymphs: 33 %
MCH: 29.1 pg (ref 26.6–33.0)
MCHC: 32.7 g/dL (ref 31.5–35.7)
MCV: 89 fL (ref 79–97)
Monocytes Absolute: 0.7 10*3/uL (ref 0.1–0.9)
Monocytes: 10 %
Neutrophils Absolute: 3.5 10*3/uL (ref 1.4–7.0)
Neutrophils: 51 %
Platelets: 298 10*3/uL (ref 150–450)
RBC: 4.26 x10E6/uL (ref 3.77–5.28)
RDW: 13.1 % (ref 11.7–15.4)
WBC: 6.7 10*3/uL (ref 3.4–10.8)

## 2020-02-06 LAB — CMP14+EGFR
ALT: 19 IU/L (ref 0–32)
AST: 16 IU/L (ref 0–40)
Albumin/Globulin Ratio: 2 (ref 1.2–2.2)
Albumin: 4.8 g/dL (ref 3.8–4.9)
Alkaline Phosphatase: 108 IU/L (ref 44–121)
BUN/Creatinine Ratio: 18 (ref 12–28)
BUN: 19 mg/dL (ref 8–27)
Bilirubin Total: 0.4 mg/dL (ref 0.0–1.2)
CO2: 27 mmol/L (ref 20–29)
Calcium: 9.5 mg/dL (ref 8.7–10.3)
Chloride: 100 mmol/L (ref 96–106)
Creatinine, Ser: 1.03 mg/dL — ABNORMAL HIGH (ref 0.57–1.00)
GFR calc Af Amer: 68 mL/min/{1.73_m2} (ref >59)
GFR calc non Af Amer: 59 mL/min/{1.73_m2} — ABNORMAL LOW (ref >59)
Globulin, Total: 2.4 g/dL (ref 1.5–4.5)
Glucose: 96 mg/dL (ref 65–99)
Potassium: 4.1 mmol/L (ref 3.5–5.2)
Sodium: 141 mmol/L (ref 134–144)
Total Protein: 7.2 g/dL (ref 6.0–8.5)

## 2020-02-06 LAB — TSH: TSH: 1.27 u[IU]/mL (ref 0.450–4.500)

## 2020-02-06 LAB — URIC ACID: Uric Acid: 7 mg/dL (ref 3.0–7.2)

## 2020-02-06 MED ORDER — CYANOCOBALAMIN 1000 MCG/ML IJ SOLN
1000.0000 ug | Freq: Once | INTRAMUSCULAR | Status: AC
  Administered 2020-02-06: 1000 ug via INTRAMUSCULAR

## 2020-02-06 MED ORDER — ALLOPURINOL 100 MG PO TABS: ORAL_TABLET | ORAL | 0 refills | Status: DC

## 2020-02-06 NOTE — Addendum Note (Signed)
Addended by: Maryruth Hancock on: 02/06/2020 11:06 AM   Modules accepted: Orders

## 2020-02-06 NOTE — Progress Notes (Signed)
   B12 injection given per order, patient tolerated well.   Erie Noe, LPN 48:27 AM

## 2020-02-08 ENCOUNTER — Other Ambulatory Visit: Payer: Self-pay | Admitting: Family Medicine

## 2020-02-08 MED ORDER — CYANOCOBALAMIN 1000 MCG/ML IJ SOLN: 1000.0000 ug | Freq: Once | INTRAMUSCULAR | 0 refills | Status: AC

## 2020-02-12 ENCOUNTER — Telehealth: Payer: Self-pay

## 2020-02-12 MED ORDER — CYANOCOBALAMIN 1000 MCG/ML IJ SOLN: 1000.0000 ug | Freq: Once | INTRAMUSCULAR | 3 refills | Status: AC

## 2020-02-12 NOTE — Telephone Encounter (Signed)
Patient is requesting a refill on her B12 shot so she can schedule her next shot.

## 2020-02-12 NOTE — Addendum Note (Signed)
Addended by: Maryruth Hancock on: 02/12/2020 03:22 PM   Modules accepted: Orders

## 2020-03-12 ENCOUNTER — Other Ambulatory Visit: Payer: Self-pay | Admitting: Family Medicine

## 2020-03-15 ENCOUNTER — Other Ambulatory Visit: Payer: Self-pay | Admitting: Family Medicine

## 2020-03-17 ENCOUNTER — Other Ambulatory Visit: Payer: Self-pay

## 2020-03-17 NOTE — Telephone Encounter (Signed)
Pt requesting refill of medication. Pt has only seen Dr. Holly Bodily. Called pt, Pt states she would like Shannon. Pt scheduled appointment for in the morning so she can see Larene Beach and also receive B12 shot. She will bring vial.    Royce Macadamia, [place] 03/17/20 1:59 PM

## 2020-03-18 ENCOUNTER — Ambulatory Visit (INDEPENDENT_AMBULATORY_CARE_PROVIDER_SITE_OTHER): Payer: 59 | Admitting: Nurse Practitioner

## 2020-03-18 ENCOUNTER — Other Ambulatory Visit: Payer: Self-pay

## 2020-03-18 ENCOUNTER — Encounter: Payer: Self-pay | Admitting: Nurse Practitioner

## 2020-03-18 ENCOUNTER — Other Ambulatory Visit: Payer: Self-pay | Admitting: Nurse Practitioner

## 2020-03-18 VITALS — BP 138/68 | HR 78 | Temp 97.6°F | Ht 63.0 in | Wt 224.0 lb

## 2020-03-18 DIAGNOSIS — E782 Mixed hyperlipidemia: Secondary | ICD-10-CM | POA: Diagnosis not present

## 2020-03-18 DIAGNOSIS — M109 Gout, unspecified: Secondary | ICD-10-CM

## 2020-03-18 DIAGNOSIS — E538 Deficiency of other specified B group vitamins: Secondary | ICD-10-CM | POA: Diagnosis not present

## 2020-03-18 DIAGNOSIS — I10 Essential (primary) hypertension: Secondary | ICD-10-CM | POA: Diagnosis not present

## 2020-03-18 DIAGNOSIS — I159 Secondary hypertension, unspecified: Secondary | ICD-10-CM | POA: Diagnosis not present

## 2020-03-18 DIAGNOSIS — E539 Vitamin B deficiency, unspecified: Secondary | ICD-10-CM

## 2020-03-18 DIAGNOSIS — L989 Disorder of the skin and subcutaneous tissue, unspecified: Secondary | ICD-10-CM

## 2020-03-18 DIAGNOSIS — F32A Depression, unspecified: Secondary | ICD-10-CM

## 2020-03-18 DIAGNOSIS — Z808 Family history of malignant neoplasm of other organs or systems: Secondary | ICD-10-CM

## 2020-03-18 MED ORDER — KETOROLAC TROMETHAMINE 60 MG/2ML IM SOLN
60.0000 mg | Freq: Once | INTRAMUSCULAR | Status: AC
  Administered 2020-03-18: 60 mg via INTRAMUSCULAR

## 2020-03-18 MED ORDER — OLMESARTAN-AMLODIPINE-HCTZ 40-10-25 MG PO TABS: 1.0000 | ORAL_TABLET | Freq: Every day | ORAL | 1 refills | Status: DC

## 2020-03-18 MED ORDER — ALLOPURINOL 100 MG PO TABS: ORAL_TABLET | ORAL | 1 refills | Status: DC

## 2020-03-18 MED ORDER — MELOXICAM 15 MG PO TABS: ORAL_TABLET | ORAL | 1 refills | Status: DC

## 2020-03-18 MED ORDER — CYANOCOBALAMIN 1000 MCG/ML IJ SOLN
1000.0000 ug | Freq: Once | INTRAMUSCULAR | Status: AC
  Administered 2020-03-18: 1000 ug via INTRAMUSCULAR

## 2020-03-18 MED ORDER — VENLAFAXINE HCL ER 150 MG PO CP24: 150.0000 mg | ORAL_CAPSULE | Freq: Every day | ORAL | 1 refills | Status: DC

## 2020-03-18 NOTE — Addendum Note (Signed)
Addended by: Rip Harbour on: 03/18/2020 07:21 PM   Modules accepted: Orders

## 2020-03-18 NOTE — Progress Notes (Addendum)
Subjective:  Patient ID: JERLYN PAIN, female    DOB: Feb 10, 1960  Age: 60 y.o. MRN: [id number]  Chief Complaint  Patient presents with  . Hypertension    1 month follow up fasting    HPI  Danielle Jones is a 60 year old Caucasian female that presents for follow-up on hypertension, and fasting labs for hyperlipidemia. She was seen 4-weeks ago in the office to re-establish primary care provider after insurance change.She states her last mammogram normal in 2021; last colonoscopy 09/27/2011, normal.     Hypertension Danielle Jones has a history of hypertension for several years. Current treatment includes Tribenzor 40/5/25 daily, heart healthy diet, and exercise. BP 138/68 today in clinic. Home BP measures have been elevated 140s/80s. She denies any chest pain, dyspnea, headaches, or dizziness. She is trying to lose weight. She has lost 11 pounds since last visit on 02/05/20.  Hyperlipidemia Danielle Jones has a history of hyperlipidemia. Current treatment includes Lipitor 40 mg daily. She denies myalgias or arthralgias side effects of statin therapy. She is eating a heart healthy diet and exercising regularly.  Gout Danielle Jones has a history of gout. Current treatment includes Allopurinol 200 mg QD and Mobic 7.5 mg BID. She states she is experiencing a current gout flare to left great toe.   B-12 Deficiency Danielle Jones was diagnosed with B12 deficiency in 2021. She is prescribed Cyanocobalamin 1,000 mcg IM monthly. She has brought dose today for injection.  Skin Lesion Danielle Jones has a raised skin lesion to her right lower back. Onset is unknown, estimates 23-month. Family history of melanoma. She has agreed to dermatology referral for further evaluation.   Current Outpatient Medications on File Prior to Visit  Medication Sig Dispense Refill  . allopurinol (ZYLOPRIM) 100 MG tablet 2 po qd 60 tablet 0  . atorvastatin (LIPITOR) 20 MG tablet Take 20 mg by mouth daily.    . ergocalciferol (VITAMIN D2) 1.25 MG (50000 UT)  capsule TAKE 1 CAPSULE BY MOUTH 1 TIME A WEEK FOR 12 DOSES    . gabapentin (NEURONTIN) 300 MG capsule TAKE 1 CAPSULE(300 MG) BY MOUTH TWICE DAILY AS NEEDED    . levothyroxine (SYNTHROID) 50 MCG tablet TAKE 1 TABLET(50 MCG) BY MOUTH DAILY    . meloxicam (MOBIC) 15 MG tablet TAKE 1/2 TABLET(7.5 MG) BY MOUTH TWICE DAILY AS NEEDED FOR PAIN    . Olmesartan-amLODIPine-HCTZ 40-5-25 MG TABS Take 1 tablet by mouth daily.    Marland Kitchen venlafaxine XR (EFFEXOR-XR) 150 MG 24 hr capsule Take 1 capsule (150 mg total) by mouth daily with breakfast. 30 capsule 1   No current facility-administered medications on file prior to visit.   Past Medical History:  Diagnosis Date  . Depression   . High cholesterol   . Hypertension    Past Surgical History:  Procedure Laterality Date  . ABDOMINAL HYSTERECTOMY    . Carpal tunnel  2019  . thumb release  2019    Family History  Problem Relation Age of Onset  . Arthritis Mother   . Alzheimer's disease Mother   . Kidney failure Mother   . Congenital heart disease Father   . Melanoma Father    Social History   Socioeconomic History  . Marital status: Single    Spouse name: Not on file  . Number of children: Not on file  . Years of education: Not on file  . Highest education level: Not on file  Occupational History  . Not on file  Tobacco Use  . Smoking status: Never Smoker  .  Smokeless tobacco: Never Used  Substance and Sexual Activity  . Alcohol use: Yes  . Drug use: No  . Sexual activity: Not on file  Other Topics Concern  . Not on file  Social History Narrative  . Not on file   Social Determinants of Health   Financial Resource Strain: Not on file  Food Insecurity: Not on file  Transportation Needs: Not on file  Physical Activity: Not on file  Stress: Not on file  Social Connections: Not on file    Review of Systems  Constitutional: Negative for fatigue and fever.  HENT: Negative for congestion, ear pain, sinus pressure and sore throat.    Eyes: Negative for pain.  Respiratory: Negative for cough, chest tightness, shortness of breath and wheezing.   Cardiovascular: Negative for chest pain and palpitations.  Gastrointestinal: Negative for abdominal pain, constipation, diarrhea, nausea and vomiting.  Genitourinary: Negative for dysuria and hematuria.  Musculoskeletal: Positive for arthralgias (left great toe) and joint swelling (left great toe). Negative for back pain and myalgias.  Skin: Negative for rash.  Neurological: Negative for dizziness, weakness and headaches.  Psychiatric/Behavioral: Negative for dysphoric mood. The patient is not nervous/anxious.      Objective:  BP 138/68 (BP Location: Left Arm, Patient Position: Sitting)   Pulse 78   Temp 97.6 F (36.4 C) (Temporal)   Ht 5\' 3"  (1.6 m)   Wt 224 lb (101.6 kg)   SpO2 98%   BMI 39.68 kg/m   BP/Weight 03/18/2020 02/05/2020 2/77/8242  Systolic BP 353 614 431  Diastolic BP 68 82 89  Wt. (Lbs) 224 233 160  BMI 39.68 41.27 28.35    Physical Exam Vitals reviewed.  Constitutional:      Appearance: She is obese.  HENT:     Head: Normocephalic.     Right Ear: Tympanic membrane normal.     Left Ear: Tympanic membrane normal.     Nose: Nose normal.     Mouth/Throat:     Mouth: Mucous membranes are moist.  Eyes:     Pupils: Pupils are equal, round, and reactive to light.  Cardiovascular:     Rate and Rhythm: Normal rate and regular rhythm.     Pulses: Normal pulses.          Dorsalis pedis pulses are 2+ on the left side.       Posterior tibial pulses are 2+ on the left side.     Heart sounds: Normal heart sounds.  Pulmonary:     Effort: Pulmonary effort is normal.     Breath sounds: Normal breath sounds.  Abdominal:     General: Bowel sounds are normal.     Palpations: Abdomen is soft.  Musculoskeletal:        General: Swelling and tenderness present.     Cervical back: Normal range of motion.       Feet:     Comments: Medial aspect of left great  toe erythematous, tender, with slight warmth noted   Feet:     Left foot:     Skin integrity: Skin integrity normal.     Comments: Medial aspect of left great toe with erythema, tenderness, and slight warmth Skin:    General: Skin is warm and dry.     Capillary Refill: Capillary refill takes less than 2 seconds.     Findings: Lesion present.          Comments: Right lower back erythematous raised skin lesion, approximately 1cm in diameter without  drainage or tenderness  Neurological:     General: No focal deficit present.     Mental Status: She is alert and oriented to person, place, and time.  Psychiatric:        Mood and Affect: Mood normal.        Behavior: Behavior normal.        Thought Content: Thought content normal.        Judgment: Judgment normal.          Lab Results  Component Value Date   WBC 6.7 02/05/2020   HGB 12.4 02/05/2020   HCT 37.9 02/05/2020   PLT 298 02/05/2020   GLUCOSE 96 02/05/2020   ALT 19 02/05/2020   AST 16 02/05/2020   NA 141 02/05/2020   K 4.1 02/05/2020   CL 100 02/05/2020   CREATININE 1.03 (H) 02/05/2020   BUN 19 02/05/2020   CO2 27 02/05/2020   TSH 1.270 02/05/2020      Assessment & Plan:   1. Primary hypertension - Olmesartan-amLODIPine-HCTZ 40-10-25 MG TABS; Take 1 tablet by mouth daily.  Dispense: 90 tablet; Refill: 1  2. Mixed hyperlipidemia - Lipid panel  3. Vitamin B deficiency - cyanocobalamin ((VITAMIN B-12)) injection 1,000 mcg - VITAMIN D 25 Hydroxy (Vit-D Deficiency, Fractures)  4. Acute gout involving toe of left foot, unspecified cause - ketorolac (TORADOL) injection 60 mg  5. Back skin lesion - Ambulatory referral to Dermatology  6. Family history of malignant melanoma - Ambulatory referral to Dermatology   We will call you labs results Continue medications, pending labs Return for follow-up in 55-months, fasting Call for any problems    Follow-up: 1-year, or sooner as needed  An After Visit  Summary was printed and given to the patient.  Rip Harbour, NP [facility] [phone]

## 2020-03-18 NOTE — Patient Instructions (Signed)
We will call you labs results We will check on last colonoscopy and schedule if due Continue medications, pending labs Return for follow-up in 33-months, fasting Call for any problems  Cholesterol Content in Foods Cholesterol is a waxy, fat-like substance that helps to carry fat in the blood. The body needs cholesterol in small amounts, but too much cholesterol can cause damage to the arteries and heart. Most people should eat less than 200 milligrams (mg) of cholesterol a day. Foods with cholesterol Cholesterol is found in animal-based foods, such as meat, seafood, and dairy. Generally, low-fat dairy and lean meats have less cholesterol than full-fat dairy and fatty meats. The milligrams of cholesterol per serving (mg per serving) of common cholesterol-containing foods are listed below. Meat and other proteins  Egg -- one large whole egg has 186 mg.  Veal shank -- 4 oz has 141 mg.  Lean ground [place] (93% lean) -- 4 oz has 118 mg.  Fat-trimmed lamb loin -- 4 oz has 106 mg.  Lean ground beef (90% lean) -- 4 oz has 100 mg.  Lobster -- 3.5 oz has 90 mg.  Pork loin chops -- 4 oz has 86 mg.  Canned salmon -- 3.5 oz has 83 mg.  Fat-trimmed beef top loin -- 4 oz has 78 mg.  Frankfurter -- 1 frank (3.5 oz) has 77 mg.  Crab -- 3.5 oz has 71 mg.  Roasted chicken without skin, white meat -- 4 oz has 66 mg.  Light bologna -- 2 oz has 45 mg.  Deli-cut [place] -- 2 oz has 31 mg.  Canned tuna -- 3.5 oz has 31 mg.  Berniece Salines -- 1 oz has 29 mg.  Oysters and mussels (raw) -- 3.5 oz has 25 mg.  Mackerel -- 1 oz has 22 mg.  Trout -- 1 oz has 20 mg.  Pork sausage -- 1 link (1 oz) has 17 mg.  Salmon -- 1 oz has 16 mg.  Tilapia -- 1 oz has 14 mg. Dairy  Soft-serve ice cream --  cup (4 oz) has 103 mg.  Whole-milk yogurt -- 1 cup (8 oz) has 29 mg.  Cheddar cheese -- 1 oz has 28 mg.  American cheese -- 1 oz has 28 mg.  Whole milk -- 1 cup (8 oz) has 23 mg.  2% milk -- 1 cup (8  oz) has 18 mg.  Cream cheese -- 1 tablespoon (Tbsp) has 15 mg.  Cottage cheese --  cup (4 oz) has 14 mg.  Low-fat (1%) milk -- 1 cup (8 oz) has 10 mg.  Sour cream -- 1 Tbsp has 8.5 mg.  Low-fat yogurt -- 1 cup (8 oz) has 8 mg.  Nonfat Greek yogurt -- 1 cup (8 oz) has 7 mg.  Half-and-half cream -- 1 Tbsp has 5 mg. Fats and oils  Cod liver oil -- 1 tablespoon (Tbsp) has 82 mg.  Butter -- 1 Tbsp has 15 mg.  Lard -- 1 Tbsp has 14 mg.  Bacon grease -- 1 Tbsp has 14 mg.  Mayonnaise -- 1 Tbsp has 5-10 mg.  Margarine -- 1 Tbsp has 3-10 mg. Exact amounts of cholesterol in these foods may vary depending on specific ingredients and brands.   Foods without cholesterol Most plant-based foods do not have cholesterol unless you combine them with a food that has cholesterol. Foods without cholesterol include:  Grains and cereals.  Vegetables.  Fruits.  Vegetable oils, such as olive, canola, and sunflower oil.  Legumes, such as peas,  beans, and lentils.  Nuts and seeds.  Egg whites.   Summary  The body needs cholesterol in small amounts, but too much cholesterol can cause damage to the arteries and heart.  Most people should eat less than 200 milligrams (mg) of cholesterol a day. This information is not intended to replace advice given to you by your health care provider. Make sure you discuss any questions you have with your health care provider. Document Revised: 06/09/2019 Document Reviewed: 06/09/2019 Elsevier Patient Education  2021 [place]. Ketorolac Injection What is this medicine? KETOROLAC (kee [place]) is a non-steroidal anti-inflammatory drug, also known as an NSAID. It treats pain, inflammation, and swelling. This medicine may be used for other purposes; ask your health care provider or pharmacist if you have questions. COMMON BRAND NAME(S): Toradol What should I tell my health care provider before I take this medicine? They need to know if you have  any of these conditions:  bleeding disorder  coronary artery bypass graft (CABG) within the past 2 weeks  heart attack  heart disease  heart failure  high blood pressure  if you often drink alcohol  kidney disease  liver disease  lung or breathing disease (asthma)  receiving steroids like dexamethasone or prednisone  smoke tobacco cigarettes  stomach bleeding  stomach ulcers, other stomach or intestine problems  take medicine to treat or prevent blood clots  an unusual or allergic reaction to ketorolac, other medicines, foods, dyes, or preservatives  pregnant or trying to get pregnant  breast-feeding How should I use this medicine? This medicine is injected into a vein or muscle. It is given by a health care provider in a hospital or clinic setting. A special MedGuide will be given to you before each treatment. Be sure to read this information carefully each time. Talk to your health care provider about the use of this medicine in children. Special care may be needed. Patients over 31 years of age may have a stronger reaction and need a smaller dose. Overdosage: If you think you have taken too much of this medicine contact a poison control center or emergency room at once. NOTE: This medicine is only for you. Do not share this medicine with others. What if I miss a dose? This does not apply. This medicine is not for regular use. What may interact with this medicine? Do not take this medicine with any of the following medications:  aspirin and aspirin-like medicines  cidofovir  methotrexate  NSAIDs, medicines for pain and inflammation, like ibuprofen or naproxen  pentoxifylline  probenecid This medicine may also interact with the following medications:  alcohol  alendronate  alprazolam  carbamazepine  diuretics  flavocoxid  fluoxetine  ginkgo  lithium  medicines for blood pressure like enalapril  medicines that affect platelets like  pentoxifylline  medicines that treat or prevent blood clots like heparin, warfarin  muscle relaxants  pemetrexed  phenytoin  thiothixene This list may not describe all possible interactions. Give your health care provider a list of all the medicines, herbs, non-prescription drugs, or dietary supplements you use. Also tell them if you smoke, drink alcohol, or use illegal drugs. Some items may interact with your medicine. What should I watch for while using this medicine? Your condition will be monitored carefully while you are receiving this medicine. Do not use this medicine for more than 5 days. It is only used for short-term treatment of moderate to severe pain. The risk of side effects such as kidney  damage and stomach bleeding are higher if used for more than 5 days. Do not take other medicines that contain aspirin, ibuprofen, or naproxen with this medicine. Side effects such as stomach upset, nausea, or ulcers may be more likely to occur. Many non-prescription medicines contain aspirin, ibuprofen, or naproxen. Always read labels carefully. This medicine can cause serious ulcers and bleeding in the stomach. It can happen with no warning. Smoking, drinking alcohol, older age, and poor health can also increase risks. Call your health care provider right away if you have stomach pain or blood in your vomit or stool. Alcohol may interfere with the effect of this medicine. Avoid alcoholic drinks. This medicine may cause serious skin reactions. They can happen weeks to months after starting the medicine. Contact your health care provider right away if you notice fevers or flu-like symptoms with a rash. The rash may be red or purple and then turn into blisters or peeling of the skin. Or, you might notice a red rash with swelling of the face, lips or lymph nodes in your neck or under your arms. Talk to your health care provider if you are pregnant before taking this medicine. Taking this medicine  between weeks 20 and 30 of pregnancy may harm your unborn baby. Your health care provider will monitor you closely if you need to take it. After 30 weeks of pregnancy, do not take this medicine. This medicine does not prevent a heart attack or stroke. This medicine may increase the chance of a heart attack or stroke. The chance may increase the longer you use this medicine or if you have heart disease. If you take aspirin to prevent a heart attack or stroke, talk to your health care provider about using this medicine. You may get drowsy or dizzy. Do not drive, use machinery, or do anything that needs mental alertness until you know how this medicine affects you. Do not stand up or sit up quickly, especially if you are an older patient. This reduces the risk of dizzy or fainting spells. What side effects may I notice from receiving this medicine? Side effects that you should report to your doctor or health care provider as soon as possible:  allergic reactions (skin rash, itching or hives; swelling of the face, lips, or tongue)  bleeding (bloody or black, tarry stools; red or dark brown urine; spitting up blood or brown material that looks like coffee grounds; red spots on the skin; unusual bruising or bleeding from the eyes, gums, or nose)  heart attack (trouble breathing; pain or tightness in the chest, neck, back or arms; unusually weak or tired)  heart failure (trouble breathing; fast, irregular heartbeat; sudden weight gain; swelling of the ankles, feet, hands; unusually weak or tired)  kidney injury (trouble passing urine or change in the amount of urine)  liver injury (dark yellow or brown urine; general ill feeling or flu-like symptoms; loss of appetite, right upper belly pain; unusually weak or tired, yellowing of the eyes or skin)  low red blood cell counts (trouble breathing; feeling faint; lightheaded, falls; unusually weak or tired)  rash, fever, and swollen lymph nodes  redness,  blistering, peeling, or loosening of the skin, including inside the mouth  stroke (changes in vision; confusion; trouble speaking or understanding; severe headaches; sudden numbness or weakness of the face, arm or leg; trouble walking; dizziness; loss of balance or coordination) Side effects that usually do not require medical attention (report to your doctor or health care professional if  they continue or are bothersome):  constipation  decreased hearing, ringing in the ears  diarrhea  headache  nausea  pain, redness, or irritation at site where injected  passing gas  stomach pain  upset stomach This list may not describe all possible side effects. Call your doctor for medical advice about side effects. You may report side effects to FDA at 1-800-FDA-1088. Where should I keep my medicine? This medicine is given in a hospital or clinic. It will not be stored at home. NOTE: This sheet is a summary. It may not cover all possible information. If you have questions about this medicine, talk to your doctor, pharmacist, or health care provider.  2021 Elsevier/Gold Standard (2019-06-12 15:02:26) Gout  Gout is painful swelling of your joints. Gout is a type of arthritis. It is caused by having too much uric acid in your body. Uric acid is a chemical that is made when your body breaks down substances called purines. If your body has too much uric acid, sharp crystals can form and build up in your joints. This causes pain and swelling. Gout attacks can happen quickly and be very painful (acute gout). Over time, the attacks can affect more joints and happen more often (chronic gout). What are the causes?  Too much uric acid in your blood. This can happen because: ? Your kidneys do not remove enough uric acid from your blood. ? Your body makes too much uric acid. ? You eat too many foods that are high in purines. These foods include organ meats, some seafood, and beer.  Trauma or  stress. What increases the risk?  Having a family history of gout.  Being female and middle-aged.  Being female and having gone through menopause.  Being very overweight (obese).  Drinking alcohol, especially beer.  Not having enough water in the body (being dehydrated).  Losing weight too quickly.  Having an organ transplant.  Having lead poisoning.  Taking certain medicines.  Having kidney disease.  Having a skin condition called psoriasis. What are the signs or symptoms? An attack of acute gout usually happens in just one joint. The most common place is the big toe. Attacks often start at night. Other joints that may be affected include joints of the feet, ankle, knee, fingers, wrist, or elbow. Symptoms of an attack may include:  Very bad pain.  Warmth.  Swelling.  Stiffness.  Shiny, red, or purple skin.  Tenderness. The affected joint may be very painful to touch.  Chills and fever. Chronic gout may cause symptoms more often. More joints may be involved. You may also have white or yellow lumps (tophi) on your hands or feet or in other areas near your joints.   How is this treated?  Treatment for this condition has two phases: treating an acute attack and preventing future attacks.  Acute gout treatment may include: ? NSAIDs. ? Steroids. These are taken by mouth or injected into a joint. ? Colchicine. This medicine relieves pain and swelling. It can be given by mouth or through an IV tube.  Preventive treatment may include: ? Taking small doses of NSAIDs or colchicine daily. ? Using a medicine that reduces uric acid levels in your blood. ? Making changes to your diet. You may need to see a food expert (dietitian) about what to eat and drink to prevent gout. Follow these instructions at home: During a gout attack  If told, put ice on the painful area: ? Put ice in a plastic bag. ?  Place a towel between your skin and the bag. ? Leave the ice on for 20  minutes, 2-3 times a day.  Raise (elevate) the painful joint above the level of your heart as often as you can.  Rest the joint as much as possible. If the joint is in your leg, you may be given crutches.  Follow instructions from your doctor about what you cannot eat or drink.   Avoiding future gout attacks  Eat a low-purine diet. Avoid foods and drinks such as: ? Liver. ? Kidney. ? Anchovies. ? Asparagus. ? Herring. ? Mushrooms. ? Mussels. ? Beer.  Stay at a healthy weight. If you want to lose weight, talk with your doctor. Do not lose weight too fast.  Start or continue an exercise plan as told by your doctor. Eating and drinking  Drink enough fluids to keep your pee (urine) pale yellow.  If you drink alcohol: ? Limit how much you use to:  0-1 drink a day for women.  0-2 drinks a day for men. ? Be aware of how much alcohol is in your drink. In the [place] one drink equals one 12 oz bottle of beer (355 mL), one 5 oz glass of wine (148 mL), or one 1 oz glass of hard liquor (44 mL). General instructions  Take over-the-counter and prescription medicines only as told by your doctor.  Do not drive or use heavy machinery while taking prescription pain medicine.  Return to your normal activities as told by your doctor. Ask your doctor what activities are safe for you.  Keep all follow-up visits as told by your doctor. This is important. Contact a doctor if:  You have another gout attack.  You still have symptoms of a gout attack after 10 days of treatment.  You have problems (side effects) because of your medicines.  You have chills or a fever.  You have burning pain when you pee (urinate).  You have pain in your lower back or belly. Get help right away if:  You have very bad pain.  Your pain cannot be controlled.  You cannot pee. Summary  Gout is painful swelling of the joints.  The most common site of pain is the big toe, but it can affect other  joints.  Medicines and avoiding some foods can help to prevent and treat gout attacks. This information is not intended to replace advice given to you by your health care provider. Make sure you discuss any questions you have with your health care provider. Document Revised: 08/08/2017 Document Reviewed: 08/08/2017 Elsevier Patient Education  2021 [place] Your Hypertension Hypertension, also called high blood pressure, is when the force of the blood pressing against the walls of the arteries is too strong. Arteries are blood vessels that carry blood from your heart throughout your body. Hypertension forces the heart to work harder to pump blood and may cause the arteries to become narrow or stiff. Understanding blood pressure readings Your personal target blood pressure may vary depending on your medical conditions, your age, and other factors. A blood pressure reading includes a higher number over a lower number. Ideally, your blood pressure should be below 120/80. You should know that:  The first, or top, number is called the systolic pressure. It is a measure of the pressure in your arteries as your heart beats.  The second, or bottom number, is called the diastolic pressure. It is a measure of the pressure in your arteries as the heart relaxes.  Blood pressure is classified into four stages. Based on your blood pressure reading, your health care provider may use the following stages to determine what type of treatment you need, if any. Systolic pressure and diastolic pressure are measured in a unit called mmHg. Normal  Systolic pressure: below 025.  Diastolic pressure: below 80. Elevated  Systolic pressure: 427-062.  Diastolic pressure: below 80. Hypertension stage 1  Systolic pressure: 376-283.  Diastolic pressure: 15-17. Hypertension stage 2  Systolic pressure: 616 or above.  Diastolic pressure: 90 or above. How can this condition affect me? Managing your  hypertension is an important responsibility. Over time, hypertension can damage the arteries and decrease blood flow to important parts of the body, including the brain, heart, and kidneys. Having untreated or uncontrolled hypertension can lead to:  A heart attack.  A stroke.  A weakened blood vessel (aneurysm).  Heart failure.  Kidney damage.  Eye damage.  Metabolic syndrome.  Memory and concentration problems.  Vascular dementia. What actions can I take to manage this condition? Hypertension can be managed by making lifestyle changes and possibly by taking medicines. Your health care provider will help you make a plan to bring your blood pressure within a normal range. Nutrition  Eat a diet that is high in fiber and potassium, and low in salt (sodium), added sugar, and fat. An example eating plan is called the Dietary Approaches to Stop Hypertension (DASH) diet. To eat this way: ? Eat plenty of fresh fruits and vegetables. Try to fill one-half of your plate at each meal with fruits and vegetables. ? Eat whole grains, such as whole-wheat pasta, brown rice, or whole-grain bread. Fill about one-fourth of your plate with whole grains. ? Eat low-fat dairy products. ? Avoid fatty cuts of meat, processed or cured meats, and poultry with skin. Fill about one-fourth of your plate with lean proteins such as fish, chicken without skin, beans, eggs, and tofu. ? Avoid pre-made and processed foods. These tend to be higher in sodium, added sugar, and fat.  Reduce your daily sodium intake. Most people with hypertension should eat less than 1,500 mg of sodium a day.   Lifestyle  Work with your health care provider to maintain a healthy body weight or to lose weight. Ask what an ideal weight is for you.  Get at least 30 minutes of exercise that causes your heart to beat faster (aerobic exercise) most days of the week. Activities may include walking, swimming, or biking.  Include exercise to  strengthen your muscles (resistance exercise), such as weight lifting, as part of your weekly exercise routine. Try to do these types of exercises for 30 minutes at least 3 days a week.  Do not use any products that contain nicotine or tobacco, such as cigarettes, e-cigarettes, and chewing tobacco. If you need help quitting, ask your health care provider.  Control any long-term (chronic) conditions you have, such as high cholesterol or diabetes.  Identify your sources of stress and find ways to manage stress. This may include meditation, deep breathing, or making time for fun activities.   Alcohol use  Do not drink alcohol if: ? Your health care provider tells you not to drink. ? You are pregnant, may be pregnant, or are planning to become pregnant.  If you drink alcohol: ? Limit how much you use to:  0-1 drink a day for women.  0-2 drinks a day for men. ? Be aware of how much alcohol is in your drink. In the  [place] one drink equals one 12 oz bottle of beer (355 mL), one 5 oz glass of wine (148 mL), or one 1 oz glass of hard liquor (44 mL). Medicines Your health care provider may prescribe medicine if lifestyle changes are not enough to get your blood pressure under control and if:  Your systolic blood pressure is 130 or higher.  Your diastolic blood pressure is 80 or higher. Take medicines only as told by your health care provider. Follow the directions carefully. Blood pressure medicines must be taken as told by your health care provider. The medicine does not work as well when you skip doses. Skipping doses also puts you at risk for problems. Monitoring Before you monitor your blood pressure:  Do not smoke, drink caffeinated beverages, or exercise within 30 minutes before taking a measurement.  Use the bathroom and empty your bladder (urinate).  Sit quietly for at least 5 minutes before taking measurements. Monitor your blood pressure at home as told by your health care  provider. To do this:  Sit with your back straight and supported.  Place your feet flat on the floor. Do not cross your legs.  Support your arm on a flat surface, such as a table. Make sure your upper arm is at heart level.  Each time you measure, take two or three readings one minute apart and record the results. You may also need to have your blood pressure checked regularly by your health care provider.   General information  Talk with your health care provider about your diet, exercise habits, and other lifestyle factors that may be contributing to hypertension.  Review all the medicines you take with your health care provider because there may be side effects or interactions.  Keep all visits as told by your health care provider. Your health care provider can help you create and adjust your plan for managing your high blood pressure. Where to find more information  National Heart, Lung, and Blood Institute: https://wilson-eaton.com/  American Heart Association: www.heart.org Contact a health care provider if:  You think you are having a reaction to medicines you have taken.  You have repeated (recurrent) headaches.  You feel dizzy.  You have swelling in your ankles.  You have trouble with your vision. Get help right away if:  You develop a severe headache or confusion.  You have unusual weakness or numbness, or you feel faint.  You have severe pain in your chest or abdomen.  You vomit repeatedly.  You have trouble breathing. These symptoms may represent a serious problem that is an emergency. Do not wait to see if the symptoms will go away. Get medical help right away. Call your local emergency services (911 in the [place] Do not drive yourself to the hospital. Summary  Hypertension is when the force of blood pumping through your arteries is too strong. If this condition is not controlled, it may put you at risk for serious complications.  Your personal target blood  pressure may vary depending on your medical conditions, your age, and other factors. For most people, a normal blood pressure is less than 120/80.  Hypertension is managed by lifestyle changes, medicines, or both.  Lifestyle changes to help manage hypertension include losing weight, eating a healthy, low-sodium diet, exercising more, stopping smoking, and limiting alcohol. This information is not intended to replace advice given to you by your health care provider. Make sure you discuss any questions you have with your health care provider. Document Revised: 02/21/2019  Document Reviewed: 12/17/2018 Elsevier Patient Education  [place]. [email or web address]">  DASH Eating Plan DASH stands for Dietary Approaches to Stop Hypertension. The DASH eating plan is a healthy eating plan that has been shown to:  Reduce high blood pressure (hypertension).  Reduce your risk for type 2 diabetes, heart disease, and stroke.  Help with weight loss. What are tips for following this plan? Reading food labels  Check food labels for the amount of salt (sodium) per serving. Choose foods with less than 5 percent of the Daily Value of sodium. Generally, foods with less than 300 milligrams (mg) of sodium per serving fit into this eating plan.  To find whole grains, look for the word "whole" as the first word in the ingredient list. Shopping  Buy products labeled as "low-sodium" or "no salt added."  Buy fresh foods. Avoid canned foods and pre-made or frozen meals. Cooking  Avoid adding salt when cooking. Use salt-free seasonings or herbs instead of table salt or sea salt. Check with your health care provider or pharmacist before using salt substitutes.  Do not fry foods. Cook foods using healthy methods such as baking, boiling, grilling, roasting, and broiling instead.  Cook with heart-healthy oils, such as olive, canola, avocado, soybean, or sunflower  oil. Meal planning  Eat a balanced diet that includes: ? 4 or more servings of fruits and 4 or more servings of vegetables each day. Try to fill one-half of your plate with fruits and vegetables. ? 6-8 servings of whole grains each day. ? Less than 6 oz (170 g) of lean meat, poultry, or fish each day. A 3-oz (85-g) serving of meat is about the same size as a deck of cards. One egg equals 1 oz (28 g). ? 2-3 servings of low-fat dairy each day. One serving is 1 cup (237 mL). ? 1 serving of nuts, seeds, or beans 5 times each week. ? 2-3 servings of heart-healthy fats. Healthy fats called omega-3 fatty acids are found in foods such as walnuts, flaxseeds, fortified milks, and eggs. These fats are also found in cold-water fish, such as sardines, salmon, and mackerel.  Limit how much you eat of: ? Canned or prepackaged foods. ? Food that is high in trans fat, such as some fried foods. ? Food that is high in saturated fat, such as fatty meat. ? Desserts and other sweets, sugary drinks, and other foods with added sugar. ? Full-fat dairy products.  Do not salt foods before eating.  Do not eat more than 4 egg yolks a week.  Try to eat at least 2 vegetarian meals a week.  Eat more home-cooked food and less restaurant, buffet, and fast food.   Lifestyle  When eating at a restaurant, ask that your food be prepared with less salt or no salt, if possible.  If you drink alcohol: ? Limit how much you use to:  0-1 drink a day for women who are not pregnant.  0-2 drinks a day for men. ? Be aware of how much alcohol is in your drink. In the [place] one drink equals one 12 oz bottle of beer (355 mL), one 5 oz glass of wine (148 mL), or one 1 oz glass of hard liquor (44 mL). General information  Avoid eating more than 2,300 mg of salt a day. If you have hypertension, you may need to reduce your sodium intake to 1,500 mg a day.  Work with your health care provider to maintain a healthy body weight or  to lose weight. Ask what an ideal weight is for you.  Get at least 30 minutes of exercise that causes your heart to beat faster (aerobic exercise) most days of the week. Activities may include walking, swimming, or biking.  Work with your health care provider or dietitian to adjust your eating plan to your individual calorie needs. What foods should I eat? Fruits All fresh, dried, or frozen fruit. Canned fruit in natural juice (without added sugar). Vegetables Fresh or frozen vegetables (raw, steamed, roasted, or grilled). Low-sodium or reduced-sodium tomato and vegetable juice. Low-sodium or reduced-sodium tomato sauce and tomato paste. Low-sodium or reduced-sodium canned vegetables. Grains Whole-grain or whole-wheat bread. Whole-grain or whole-wheat pasta. Brown rice. [place]. Bulgur. Whole-grain and low-sodium cereals. Pita bread. Low-fat, low-sodium crackers. Whole-wheat flour tortillas. Meats and other proteins Skinless chicken or [place]. Ground chicken or [place]. Pork with fat trimmed off. Fish and seafood. Egg whites. Dried beans, peas, or lentils. Unsalted nuts, nut butters, and seeds. Unsalted canned beans. Lean cuts of beef with fat trimmed off. Low-sodium, lean precooked or cured meat, such as sausages or meat loaves. Dairy Low-fat (1%) or fat-free (skim) milk. Reduced-fat, low-fat, or fat-free cheeses. Nonfat, low-sodium ricotta or cottage cheese. Low-fat or nonfat yogurt. Low-fat, low-sodium cheese. Fats and oils Soft margarine without trans fats. Vegetable oil. Reduced-fat, low-fat, or light mayonnaise and salad dressings (reduced-sodium). Canola, safflower, olive, avocado, soybean, and sunflower oils. Avocado. Seasonings and condiments Herbs. Spices. Seasoning mixes without salt. Other foods Unsalted popcorn and pretzels. Fat-free sweets. The items listed above may not be a complete list of foods and beverages you can eat. Contact a dietitian for more information. What  foods should I avoid? Fruits Canned fruit in a light or heavy syrup. Fried fruit. Fruit in cream or butter sauce. Vegetables Creamed or fried vegetables. Vegetables in a cheese sauce. Regular canned vegetables (not low-sodium or reduced-sodium). Regular canned tomato sauce and paste (not low-sodium or reduced-sodium). Regular tomato and vegetable juice (not low-sodium or reduced-sodium). Angie Fava. Olives. Grains Baked goods made with fat, such as croissants, muffins, or some breads. Dry pasta or rice meal packs. Meats and other proteins Fatty cuts of meat. Ribs. Fried meat. Berniece Salines. Bologna, salami, and other precooked or cured meats, such as sausages or meat loaves. Fat from the back of a pig (fatback). Bratwurst. Salted nuts and seeds. Canned beans with added salt. Canned or smoked fish. Whole eggs or egg yolks. Chicken or [place] with skin. Dairy Whole or 2% milk, cream, and half-and-half. Whole or full-fat cream cheese. Whole-fat or sweetened yogurt. Full-fat cheese. Nondairy creamers. Whipped toppings. Processed cheese and cheese spreads. Fats and oils Butter. Stick margarine. Lard. Shortening. Ghee. Bacon fat. Tropical oils, such as coconut, palm kernel, or palm oil. Seasonings and condiments Onion salt, garlic salt, seasoned salt, table salt, and sea salt. Worcestershire sauce. Tartar sauce. Barbecue sauce. Teriyaki sauce. Soy sauce, including reduced-sodium. Steak sauce. Canned and packaged gravies. Fish sauce. Oyster sauce. Cocktail sauce. Store-bought horseradish. Ketchup. Mustard. Meat flavorings and tenderizers. Bouillon cubes. Hot sauces. Pre-made or packaged marinades. Pre-made or packaged taco seasonings. Relishes. Regular salad dressings. Other foods Salted popcorn and pretzels. The items listed above may not be a complete list of foods and beverages you should avoid. Contact a dietitian for more information. Where to find more information  National Heart, Lung, and Blood Institute:  https://wilson-eaton.com/  American Heart Association: www.heart.org  Academy of Nutrition and Dietetics: www.eatright.[place]: www.kidney.org Summary  The DASH eating plan  is a healthy eating plan that has been shown to reduce high blood pressure (hypertension). It may also reduce your risk for type 2 diabetes, heart disease, and stroke.  When on the DASH eating plan, aim to eat more fresh fruits and vegetables, whole grains, lean proteins, low-fat dairy, and heart-healthy fats.  With the DASH eating plan, you should limit salt (sodium) intake to 2,300 mg a day. If you have hypertension, you may need to reduce your sodium intake to 1,500 mg a day.  Work with your health care provider or dietitian to adjust your eating plan to your individual calorie needs. This information is not intended to replace advice given to you by your health care provider. Make sure you discuss any questions you have with your health care provider. Document Revised: 12/20/2018 Document Reviewed: 12/20/2018 Elsevier Patient Education  2021 [employer].

## 2020-03-19 LAB — LIPID PANEL
Chol/HDL Ratio: 3.7 ratio (ref 0.0–4.4)
Cholesterol, Total: 155 mg/dL (ref 100–199)
HDL: 42 mg/dL (ref >39)
LDL Chol Calc (NIH): 87 mg/dL (ref 0–99)
Triglycerides: 146 mg/dL (ref 0–149)
VLDL Cholesterol Cal: 26 mg/dL (ref 5–40)

## 2020-03-19 LAB — VITAMIN D 25 HYDROXY (VIT D DEFICIENCY, FRACTURES): Vit D, 25-Hydroxy: 55.8 ng/mL (ref 30.0–100.0)

## 2020-03-19 LAB — CARDIOVASCULAR RISK ASSESSMENT

## 2020-04-01 ENCOUNTER — Encounter: Payer: Self-pay | Admitting: Orthopaedic Surgery

## 2020-04-01 ENCOUNTER — Ambulatory Visit (INDEPENDENT_AMBULATORY_CARE_PROVIDER_SITE_OTHER): Payer: 59

## 2020-04-01 ENCOUNTER — Other Ambulatory Visit: Payer: Self-pay

## 2020-04-01 ENCOUNTER — Ambulatory Visit (INDEPENDENT_AMBULATORY_CARE_PROVIDER_SITE_OTHER): Payer: 59 | Admitting: Orthopaedic Surgery

## 2020-04-01 DIAGNOSIS — M1812 Unilateral primary osteoarthritis of first carpometacarpal joint, left hand: Secondary | ICD-10-CM

## 2020-04-01 NOTE — Progress Notes (Signed)
Office Visit Note   Patient: Danielle Jones           Date of Birth: [birth year]           MRN: [id number] Visit Date: 04/01/2020              Requested by: Rip Harbour, NP [address] [place],  [place] [address] PCP: Rip Harbour, NP   Assessment & Plan: Visit Diagnoses:  1. Primary osteoarthritis of first carpometacarpal joint of left hand     Plan: Impression is end-stage left thumb basal joint arthritis.  She underwent CMC joint capsulorrhaphy with palmaris longus graft by Dr. Jodelle Gross back in 2019 and unfortunately the degenerative disease has progressed.  Based on discussion of treatment options to include continued conservative treatments versus CMC arthroplasty, she would like to try conservative treatments a little bit longer.  She will follow up with [place] when she is ready for surgery.  Follow-Up Instructions: Return if symptoms worsen or fail to improve.   Orders:  Orders Placed This Encounter  Procedures  . XR Hand Complete Left   No orders of the defined types were placed in this encounter.     Procedures: No procedures performed   Clinical Data: No additional findings.   Subjective: Chief Complaint  Patient presents with  . Left Hand - Pain    Danielle Jones is a 60 year old female here for evaluation of chronic left thumb pain.  She underwent left carpal tunnel release in 2019 as well as thumb CMC capsulorrhaphy by Dr. Jodelle Gross.  She has progressively gotten worse with use of the hand.  She has constant pain with stiffness.  She feels like she needs to soak her hand in warm water.  She has 1 many different types of supportive braces with minimal relief.  She takes meloxicam on a daily basis with temporary relief.  She has trouble with daily activities due to the base of the thumb.   Review of Systems  Constitutional: Negative.   HENT: Negative.   Eyes: Negative.   Respiratory: Negative.   Cardiovascular: Negative.   Endocrine: Negative.    Musculoskeletal: Negative.   Neurological: Negative.   Hematological: Negative.   Psychiatric/Behavioral: Negative.   All other systems reviewed and are negative.    Objective: Vital Signs: There were no vitals taken for this visit.  Physical Exam Vitals and nursing note reviewed.  Constitutional:      Appearance: She is well-developed and well-nourished.  HENT:     Head: Normocephalic and atraumatic.  Eyes:     Extraocular Movements: EOM normal.  Pulmonary:     Effort: Pulmonary effort is normal.  Abdominal:     Palpations: Abdomen is soft.  Musculoskeletal:     Cervical back: Neck supple.  Skin:    General: Skin is warm.     Capillary Refill: Capillary refill takes less than 2 seconds.  Neurological:     Mental Status: She is alert and oriented to person, place, and time.  Psychiatric:        Mood and Affect: Mood and affect normal.        Behavior: Behavior normal.        Thought Content: Thought content normal.        Judgment: Judgment normal.     Ortho Exam Left hand shows multiple surgical scars that are fully healed.  Grind test elicits pain and crepitus.  She lacks about a centimeter of the thumb opposition  to the fifth metacarpal head.  Negative Finkelstein's.  There is no triggering.  She can make a full composite fist with the remaining digits.  Specialty Comments:  No specialty comments available.  Imaging: XR Hand Complete Left  Result Date: 04/01/2020 Advanced degenerative changes of the basal joint.  Lucency of the trapezium and base of the thumb metacarpal from prior capsulorrhaphy surgery.    PMFS History: Patient Active Problem List   Diagnosis Date Noted  . Gout 02/05/2020  . Secondary hypertension 02/05/2020  . Vitamin B deficiency 02/05/2020  . Hypothyroidism 02/05/2020  . Anxiety 02/05/2020  . Depression 02/05/2020  . Hand pain, left 02/05/2020  . Family history of malignant melanoma 02/05/2020  . Elevated hemoglobin A1c  01/02/2019  . Encounter for vitamin deficiency screening 01/02/2019  . Low vitamin B12 level 01/02/2019  . Primary osteoarthritis of first carpometacarpal joint of left hand 09/25/2018  . GAD (generalized anxiety disorder) 04/10/2018  . Hyperlipidemia 04/10/2018  . Carpal tunnel syndrome of left wrist 08/17/2017   Past Medical History:  Diagnosis Date  . Depression   . High cholesterol   . Hypertension     Family History  Problem Relation Age of Onset  . Arthritis Mother   . Alzheimer's disease Mother   . Kidney failure Mother   . Congenital heart disease Father   . Melanoma Father     Past Surgical History:  Procedure Laterality Date  . ABDOMINAL HYSTERECTOMY    . Carpal tunnel  2019  . thumb release  2019   Social History   Occupational History  . Not on file  Tobacco Use  . Smoking status: Never Smoker  . Smokeless tobacco: Never Used  Substance and Sexual Activity  . Alcohol use: Yes  . Drug use: No  . Sexual activity: Not on file

## 2020-04-30 ENCOUNTER — Ambulatory Visit (INDEPENDENT_AMBULATORY_CARE_PROVIDER_SITE_OTHER): Payer: 59 | Admitting: Orthopaedic Surgery

## 2020-04-30 ENCOUNTER — Encounter: Payer: Self-pay | Admitting: Orthopaedic Surgery

## 2020-04-30 ENCOUNTER — Other Ambulatory Visit: Payer: Self-pay

## 2020-04-30 DIAGNOSIS — M1812 Unilateral primary osteoarthritis of first carpometacarpal joint, left hand: Secondary | ICD-10-CM | POA: Diagnosis not present

## 2020-04-30 NOTE — Progress Notes (Signed)
Office Visit Note   Patient: Danielle Jones           Date of Birth: [birth year]           MRN: [id number] Visit Date: 04/30/2020              Requested by: Rip Harbour, NP [address] [place],  [place] [address] PCP: Rip Harbour, NP   Assessment & Plan: Visit Diagnoses:  1. Primary osteoarthritis of first carpometacarpal joint of left hand     Plan: At this point Danielle Jones has exhausted all conservative treatments as she continues to have chronic severe pain that is limiting her ADLs.  Based on her options she has elected to proceed with a left thumb CMC arthroplasty.  Again discussed the risk benefits rehab recovery and realistic outcomes.  All questions answered.  We look forward to treating her in the operating room.  Follow-Up Instructions: Return if symptoms worsen or fail to improve.   Orders:  No orders of the defined types were placed in this encounter.  No orders of the defined types were placed in this encounter.     Procedures: No procedures performed   Clinical Data: No additional findings.   Subjective: Chief Complaint  Patient presents with  . Left Hand - Pain    Danielle Jones is a very pleasant 60 year old female who comes back today to follow-up for chronic left thumb basal joint arthritis and pain.  She has had increasing difficulty with hand use and she now has constant and severe pain with ADLs.  She cannot find any medications that will help with the pain.  She underwent thumb CMC capsulorrhaphy by Dr. Jodelle Gross several years ago.   Review of Systems  Constitutional: Negative.   HENT: Negative.   Eyes: Negative.   Respiratory: Negative.   Cardiovascular: Negative.   Endocrine: Negative.   Musculoskeletal: Negative.   Neurological: Negative.   Hematological: Negative.   Psychiatric/Behavioral: Negative.   All other systems reviewed and are negative.    Objective: Vital Signs: There were no vitals taken for this  visit.  Physical Exam Vitals and nursing note reviewed.  Constitutional:      Appearance: She is well-developed.  Pulmonary:     Effort: Pulmonary effort is normal.  Skin:    General: Skin is warm.     Capillary Refill: Capillary refill takes less than 2 seconds.  Neurological:     Mental Status: She is alert and oriented to person, place, and time.  Psychiatric:        Behavior: Behavior normal.        Thought Content: Thought content normal.        Judgment: Judgment normal.     Ortho Exam Left thumb shows pain and crepitus with grind test.  She lacks about half a centimeter of opposition of the tip of the thumb to the fifth metacarpal head. Specialty Comments:  No specialty comments available.  Imaging: No results found.   PMFS History: Patient Active Problem List   Diagnosis Date Noted  . Gout 02/05/2020  . Secondary hypertension 02/05/2020  . Vitamin B deficiency 02/05/2020  . Hypothyroidism 02/05/2020  . Anxiety 02/05/2020  . Depression 02/05/2020  . Hand pain, left 02/05/2020  . Family history of malignant melanoma 02/05/2020  . Elevated hemoglobin A1c 01/02/2019  . Encounter for vitamin deficiency screening 01/02/2019  . Low vitamin B12 level 01/02/2019  . Primary osteoarthritis of first carpometacarpal joint of left  hand 09/25/2018  . GAD (generalized anxiety disorder) 04/10/2018  . Hyperlipidemia 04/10/2018  . Carpal tunnel syndrome of left wrist 08/17/2017   Past Medical History:  Diagnosis Date  . Depression   . High cholesterol   . Hypertension     Family History  Problem Relation Age of Onset  . Arthritis Mother   . Alzheimer's disease Mother   . Kidney failure Mother   . Congenital heart disease Father   . Melanoma Father     Past Surgical History:  Procedure Laterality Date  . ABDOMINAL HYSTERECTOMY    . Carpal tunnel  2019  . thumb release  2019   Social History   Occupational History  . Not on file  Tobacco Use  . Smoking  status: Never Smoker  . Smokeless tobacco: Never Used  Substance and Sexual Activity  . Alcohol use: Yes  . Drug use: No  . Sexual activity: Not on file

## 2020-05-04 ENCOUNTER — Encounter (HOSPITAL_BASED_OUTPATIENT_CLINIC_OR_DEPARTMENT_OTHER): Payer: Self-pay | Admitting: Orthopaedic Surgery

## 2020-05-04 ENCOUNTER — Other Ambulatory Visit: Payer: Self-pay

## 2020-05-06 ENCOUNTER — Other Ambulatory Visit: Payer: Self-pay

## 2020-05-10 ENCOUNTER — Encounter (HOSPITAL_BASED_OUTPATIENT_CLINIC_OR_DEPARTMENT_OTHER)
Admission: RE | Admit: 2020-05-10 | Discharge: 2020-05-10 | Disposition: A | Discharge status: Home / Self Care | Payer: 59 | Source: Ambulatory Visit | Attending: Orthopaedic Surgery | Admitting: Orthopaedic Surgery

## 2020-05-10 ENCOUNTER — Other Ambulatory Visit (HOSPITAL_COMMUNITY)
Admission: RE | Admit: 2020-05-10 | Discharge: 2020-05-10 | Disposition: A | Discharge status: Home / Self Care | Payer: 59 | Source: Ambulatory Visit | Attending: Orthopaedic Surgery | Admitting: Orthopaedic Surgery

## 2020-05-10 DIAGNOSIS — Z01812 Encounter for preprocedural laboratory examination: Secondary | ICD-10-CM | POA: Insufficient documentation

## 2020-05-10 DIAGNOSIS — Z20822 Contact with and (suspected) exposure to covid-19: Secondary | ICD-10-CM | POA: Insufficient documentation

## 2020-05-10 DIAGNOSIS — I10 Essential (primary) hypertension: Secondary | ICD-10-CM | POA: Insufficient documentation

## 2020-05-10 DIAGNOSIS — Z01818 Encounter for other preprocedural examination: Secondary | ICD-10-CM | POA: Insufficient documentation

## 2020-05-10 LAB — BASIC METABOLIC PANEL
Anion gap: 8 (ref 5–15)
BUN: 11 mg/dL (ref 8–23)
CO2: 31 mmol/L (ref 22–32)
Calcium: 9.5 mg/dL (ref 8.9–10.3)
Chloride: 99 mmol/L (ref 98–111)
Creatinine, Ser: 0.94 mg/dL (ref 0.44–1.00)
GFR, Estimated: >60 mL/min (ref >60)
Glucose, Bld: 98 mg/dL (ref 70–99)
Potassium: 3.8 mmol/L (ref 3.5–5.1)
Sodium: 138 mmol/L (ref 135–145)

## 2020-05-10 LAB — SARS CORONAVIRUS 2 (TAT 6-24 HRS): SARS Coronavirus 2: NEGATIVE

## 2020-05-10 NOTE — Progress Notes (Signed)

## 2020-05-12 ENCOUNTER — Other Ambulatory Visit: Payer: Self-pay

## 2020-05-12 ENCOUNTER — Encounter (HOSPITAL_BASED_OUTPATIENT_CLINIC_OR_DEPARTMENT_OTHER): Payer: Self-pay | Admitting: Orthopaedic Surgery

## 2020-05-12 ENCOUNTER — Ambulatory Visit (HOSPITAL_BASED_OUTPATIENT_CLINIC_OR_DEPARTMENT_OTHER): Payer: 59 | Admitting: Certified Registered"

## 2020-05-12 ENCOUNTER — Encounter (HOSPITAL_BASED_OUTPATIENT_CLINIC_OR_DEPARTMENT_OTHER)
Admission: RE | Disposition: A | Discharge status: Home / Self Care | Payer: Self-pay | Source: Home / Self Care | Attending: Orthopaedic Surgery

## 2020-05-12 ENCOUNTER — Ambulatory Visit (HOSPITAL_BASED_OUTPATIENT_CLINIC_OR_DEPARTMENT_OTHER)
Admission: RE | Admit: 2020-05-12 | Discharge: 2020-05-12 | Disposition: A | Discharge status: Home / Self Care | Payer: 59 | Attending: Orthopaedic Surgery | Admitting: Orthopaedic Surgery

## 2020-05-12 DIAGNOSIS — M1812 Unilateral primary osteoarthritis of first carpometacarpal joint, left hand: Secondary | ICD-10-CM

## 2020-05-12 DIAGNOSIS — Z79899 Other long term (current) drug therapy: Secondary | ICD-10-CM | POA: Insufficient documentation

## 2020-05-12 DIAGNOSIS — Z79890 Hormone replacement therapy (postmenopausal): Secondary | ICD-10-CM | POA: Diagnosis not present

## 2020-05-12 HISTORY — DX: Primary osteoarthritis, unspecified hand: M19.049

## 2020-05-12 HISTORY — DX: Gout, unspecified: M10.9

## 2020-05-12 HISTORY — DX: Hypothyroidism, unspecified: E03.9

## 2020-05-12 HISTORY — DX: Anxiety disorder, unspecified: F41.9

## 2020-05-12 HISTORY — PX: CARPOMETACARPEL SUSPENSION PLASTY: SHX5005

## 2020-05-12 SURGERY — CARPOMETACARPEL (CMC) SUSPENSION PLASTY
Anesthesia: Monitor Anesthesia Care | Site: Thumb | Laterality: Left

## 2020-05-12 MED ORDER — CEFAZOLIN SODIUM-DEXTROSE 2-4 GM/100ML-% IV SOLN
INTRAVENOUS | Status: AC
  Filled 2020-05-12: qty 100

## 2020-05-12 MED ORDER — LACTATED RINGERS IV SOLN: INTRAVENOUS | Status: DC

## 2020-05-12 MED ORDER — FENTANYL CITRATE (PF) 100 MCG/2ML IJ SOLN
100.0000 ug | Freq: Once | INTRAMUSCULAR | Status: AC
Start: 2020-05-12 — End: 2020-05-12
  Administered 2020-05-12: 50 ug via INTRAVENOUS

## 2020-05-12 MED ORDER — PROPOFOL 500 MG/50ML IV EMUL
INTRAVENOUS | Status: AC
  Filled 2020-05-12: qty 50

## 2020-05-12 MED ORDER — CEFAZOLIN SODIUM-DEXTROSE 2-4 GM/100ML-% IV SOLN
2.0000 g | INTRAVENOUS | Status: AC
  Administered 2020-05-12: 2 g via INTRAVENOUS

## 2020-05-12 MED ORDER — PROPOFOL 10 MG/ML IV BOLUS
INTRAVENOUS | Status: AC
  Filled 2020-05-12: qty 20

## 2020-05-12 MED ORDER — ONDANSETRON HCL 4 MG/2ML IJ SOLN
INTRAMUSCULAR | Status: DC | PRN
  Administered 2020-05-12: 4 mg via INTRAVENOUS

## 2020-05-12 MED ORDER — DEXAMETHASONE SODIUM PHOSPHATE 10 MG/ML IJ SOLN
INTRAMUSCULAR | Status: DC | PRN
  Administered 2020-05-12: 5 mg

## 2020-05-12 MED ORDER — ACETAMINOPHEN 500 MG PO TABS
ORAL_TABLET | ORAL | Status: AC
  Filled 2020-05-12: qty 2

## 2020-05-12 MED ORDER — MIDAZOLAM HCL 2 MG/2ML IJ SOLN
2.0000 mg | Freq: Once | INTRAMUSCULAR | Status: AC
  Administered 2020-05-12: 2 mg via INTRAVENOUS

## 2020-05-12 MED ORDER — ROPIVACAINE HCL 7.5 MG/ML IJ SOLN
INTRAMUSCULAR | Status: DC | PRN
  Administered 2020-05-12: 20 mL via PERINEURAL

## 2020-05-12 MED ORDER — FENTANYL CITRATE (PF) 100 MCG/2ML IJ SOLN
INTRAMUSCULAR | Status: AC
  Filled 2020-05-12: qty 2

## 2020-05-12 MED ORDER — PROPOFOL 10 MG/ML IV BOLUS
INTRAVENOUS | Status: DC | PRN
  Administered 2020-05-12: 10 mg via INTRAVENOUS
  Administered 2020-05-12: 40 mg via INTRAVENOUS

## 2020-05-12 MED ORDER — ACETAMINOPHEN 500 MG PO TABS
1000.0000 mg | ORAL_TABLET | Freq: Once | ORAL | Status: AC
  Administered 2020-05-12: 1000 mg via ORAL

## 2020-05-12 MED ORDER — PROPOFOL 500 MG/50ML IV EMUL
INTRAVENOUS | Status: DC | PRN
  Administered 2020-05-12: 75 ug/kg/min via INTRAVENOUS

## 2020-05-12 MED ORDER — FENTANYL CITRATE (PF) 100 MCG/2ML IJ SOLN: 25.0000 ug | INTRAMUSCULAR | Status: DC | PRN

## 2020-05-12 MED ORDER — OXYCODONE-ACETAMINOPHEN 5-325 MG PO TABS: 1.0000 | ORAL_TABLET | Freq: Three times a day (TID) | ORAL | 0 refills | Status: DC | PRN

## 2020-05-12 MED ORDER — FENTANYL CITRATE (PF) 100 MCG/2ML IJ SOLN
INTRAMUSCULAR | Status: DC | PRN
  Administered 2020-05-12: 25 ug via INTRAVENOUS

## 2020-05-12 MED ORDER — MIDAZOLAM HCL 2 MG/2ML IJ SOLN
INTRAMUSCULAR | Status: AC
  Filled 2020-05-12: qty 2

## 2020-05-12 SURGICAL SUPPLY — 76 items
BAND INSRT 18 STRL LF DISP RB (MISCELLANEOUS) ×3
BAND RUBBER #18 3X1/16 STRL (MISCELLANEOUS) ×6 IMPLANT
BIT DRILL PASSING CMC 1/4 FLEX (BIT) ×1 IMPLANT
BLADE MINI RND TIP GREEN BEAV (BLADE) ×4 IMPLANT
BLADE SURG 15 STRL LF DISP TIS (BLADE) ×2 IMPLANT
BLADE SURG 15 STRL SS (BLADE) ×4
BNDG CMPR 9X4 STRL LF SNTH (GAUZE/BANDAGES/DRESSINGS) ×1
BNDG ELASTIC 3X5.8 VLCR STR LF (GAUZE/BANDAGES/DRESSINGS) ×2 IMPLANT
BNDG ESMARK 4X9 LF (GAUZE/BANDAGES/DRESSINGS) ×2 IMPLANT
BRUSH SCRUB EZ PLAIN DRY (MISCELLANEOUS) ×2 IMPLANT
BUTTON ALL-SUT W/BACKSTOP (Orthopedic Implant) ×2 IMPLANT
CORD BIPOLAR FORCEPS 12FT (ELECTRODE) ×2 IMPLANT
COVER BACK TABLE 60X90IN (DRAPES) ×2 IMPLANT
COVER WAND RF STERILE (DRAPES) IMPLANT
CUFF TOURN SGL QUICK 18X4 (TOURNIQUET CUFF) ×2 IMPLANT
DECANTER SPIKE VIAL GLASS SM (MISCELLANEOUS) IMPLANT
DRAPE EXTREMITY T 121X128X90 (DISPOSABLE) ×2 IMPLANT
DRAPE IMP U-DRAPE 54X76 (DRAPES) ×2 IMPLANT
DRAPE OEC MINIVIEW 54X84 (DRAPES) ×2 IMPLANT
DRAPE SURG 17X23 STRL (DRAPES) ×2 IMPLANT
DRILL PASSING CMC 1/4 FLEX (BIT) ×2
GAUZE 4X4 16PLY RFD (DISPOSABLE) IMPLANT
GAUZE SPONGE 4X4 12PLY STRL (GAUZE/BANDAGES/DRESSINGS) ×2 IMPLANT
GAUZE XEROFORM 1X8 LF (GAUZE/BANDAGES/DRESSINGS) ×2 IMPLANT
GLOVE SURG LTX SZ7 (GLOVE) ×2 IMPLANT
GLOVE SURG NEOP MICRO LF SZ7.5 (GLOVE) ×2 IMPLANT
GLOVE SURG SYN 7.5  E (GLOVE) ×4
GLOVE SURG SYN 7.5 E (GLOVE) ×2 IMPLANT
GLOVE SURG UNDER POLY LF SZ7 (GLOVE) ×2 IMPLANT
GOWN STRL REIN XL XLG (GOWN DISPOSABLE) ×4 IMPLANT
GOWN STRL REUS W/ TWL LRG LVL3 (GOWN DISPOSABLE) ×1 IMPLANT
GOWN STRL REUS W/ TWL XL LVL3 (GOWN DISPOSABLE) IMPLANT
GOWN STRL REUS W/TWL LRG LVL3 (GOWN DISPOSABLE) ×2
GOWN STRL REUS W/TWL XL LVL3 (GOWN DISPOSABLE)
NEEDLE HYPO 25X1 1.5 SAFETY (NEEDLE) IMPLANT
NS IRRIG 1000ML POUR BTL (IV SOLUTION) ×2 IMPLANT
PACK BASIN DAY SURGERY FS (CUSTOM PROCEDURE TRAY) ×2 IMPLANT
PAD CAST 3X4 CTTN HI CHSV (CAST SUPPLIES) ×2 IMPLANT
PADDING CAST ABS 3INX4YD NS (CAST SUPPLIES)
PADDING CAST ABS 4INX4YD NS (CAST SUPPLIES)
PADDING CAST ABS COTTON 3X4 (CAST SUPPLIES) IMPLANT
PADDING CAST ABS COTTON 4X4 ST (CAST SUPPLIES) IMPLANT
PADDING CAST COTTON 3X4 STRL (CAST SUPPLIES) ×4
PIN TRAPEZIUM CMC MICROLINK (BIT) ×2 IMPLANT
SHEET MEDIUM DRAPE 40X70 STRL (DRAPES) ×2 IMPLANT
SLEEVE SCD COMPRESS KNEE MED (STOCKING) ×2 IMPLANT
SLING ARM FOAM STRAP XLG (SOFTGOODS) ×2 IMPLANT
SPLINT FIBERGLASS 3X35 (CAST SUPPLIES) ×2 IMPLANT
STOCKINETTE 4X48 STRL (DRAPES) ×2 IMPLANT
SUCTION FRAZIER HANDLE 10FR (MISCELLANEOUS) ×2
SUCTION TUBE FRAZIER 10FR DISP (MISCELLANEOUS) ×1 IMPLANT
SUT ETHIBOND 0 MO6 C/R (SUTURE) IMPLANT
SUT ETHILON 3 0 PS 1 (SUTURE) ×2 IMPLANT
SUT ETHILON 4 0 PS 2 18 (SUTURE) ×2 IMPLANT
SUT FIBERWIRE #2 38 T-5 BLUE (SUTURE)
SUT FIBERWIRE 2-0 18 17.9 3/8 (SUTURE)
SUT MNCRL AB 4-0 PS2 18 (SUTURE) IMPLANT
SUT VIC AB 0 SH 27 (SUTURE) IMPLANT
SUT VIC AB 2-0 CT1 27 (SUTURE) ×2
SUT VIC AB 2-0 CT1 TAPERPNT 27 (SUTURE) ×1 IMPLANT
SUT VIC AB 2-0 SH 27 (SUTURE) ×2
SUT VIC AB 2-0 SH 27XBRD (SUTURE) ×1 IMPLANT
SUT VIC AB 3-0 CT1 27 (SUTURE)
SUT VIC AB 3-0 CT1 27XBRD (SUTURE) IMPLANT
SUT VIC AB 3-0 PS1 18 (SUTURE) ×6
SUT VIC AB 3-0 PS1 18XBRD (SUTURE) ×3 IMPLANT
SUTURE FIBERWR #2 38 T-5 BLUE (SUTURE) IMPLANT
SUTURE FIBERWR 2-0 18 17.9 3/8 (SUTURE) IMPLANT
SUTURE TAPE 1.3 FIBERLOP 20 ST (SUTURE) IMPLANT
SUTURETAPE 1.3 FIBERLOOP 20 ST (SUTURE)
SYR BULB EAR ULCER 3OZ GRN STR (SYRINGE) ×2 IMPLANT
SYR CONTROL 10ML LL (SYRINGE) IMPLANT
TOWEL GREEN STERILE FF (TOWEL DISPOSABLE) ×4 IMPLANT
TRAY DSU PREP LF (CUSTOM PROCEDURE TRAY) ×2 IMPLANT
TUBE CONNECTING 20X1/4 (TUBING) ×2 IMPLANT
UNDERPAD 30X36 HEAVY ABSORB (UNDERPADS AND DIAPERS) ×2 IMPLANT

## 2020-05-12 NOTE — Op Note (Signed)
   DATE OF SURGERY:05/12/2020  PREOPERATIVE DIAGNOSIS:  1. Left thumb basal joint arthritis 2. Prior left thumb CMC joint capsulorraphy   POSTOPERATIVE DIAGNOSIS: same.  PROCEDURE:  1. Left thumb carpometacarpal interposition arthroplasty ([id number]); thumb  2. Left thumb carpometacarpal reconstruction with tendon graft ([id number]).    IMPLANTS: Conmed microlink  SURGEON: N. Eduard Roux, MD  ASSIST: Madalyn Rob, PA-C; necessary for the timely completion of procedure and due to complexity of procedure.  ANESTHESIA: supraclavicular, MAC  TOURNIQUET TIME: see anesthesia record.  BLOOD LOSS: Minimal.  COMPLICATIONS: None.  PATHOLOGY: None.  TIME OUT: A time out was performed before the procedure started.  INDICATIONS FOR PROCEDURE: The patient presents today for the above mentioned procedure after failing extensive conservative measures.  The risks, benefits, and alternatives to surgery were discussed and the patient elected to proceed with surgery.  DESCRIPTION OF PROCEDURE: The previous surgical scar was incised and extended proximally. The superficial radial nerve branches were identified and protected. Radial artery was exposed and protected. We dissected sharply in between the extensor pollicis brevis and abductor pollicis longus tendon interval. Capsulotomy was performed. Capsular flaps were raised. Fluoroscopy was used to identify the borders of the trapezium.  Trapezium was exposed and removed. A second incision was made over the ulnar base of the second metacarpal.  Dissection carried down to the cortex and freer elevator was used to raise the muscle off of the bone.  Using fluoroscopic guidance, a pin was drilled through the drill guide from the thumb metacarpal base into the second metacarpal base.  The microlink device was delivered and the appropriate tension was determined.  Radiographic landmarks were used to assess proper placement of the microlink device.  Thumb  range of motion was assessed through abduction, palmar abduction and opposition to make sure the device was not overtensioned.  I then made a third incision on the volar forearm approximately 10 cm proximal to the flexor wrist crease. Flexor carpi radialis tendon was exposed and transected. The FCR tendon then was delivered out through the Wagner's incision.  The FCR tendon was rolled into an anchovy and sutured with 0 vicryl and placed into the space created by the trapeziectomy.  The wounds were irrigated. The capsular tissue was repaired using 2-0 Vicryl sutures. Then tourniquet was deflated. Hemostasis achieved. Wounds were irrigated and closed using 3-0 Vicryl and 4-0 nylon sutures. Sterile dressing applied, hand immobilized in a thumb spica splint. The patient then was transferred to the recovery room in stable condition after all counts were correct.  POSTOPERATIVE PLAN: Return in two weeks for suture removal and application of a thumb spica cast. Four weeks after surgery cast will be removed and switch to a thumb spica brace and start hand therapy.

## 2020-05-12 NOTE — Anesthesia Procedure Notes (Signed)
Anesthesia Regional Block: Supraclavicular block   Pre-Anesthetic Checklist: ,, timeout performed, Correct Patient, Correct Site, Correct Laterality, Correct Procedure, Correct Position, site marked, Risks and benefits discussed,  Surgical consent,  Pre-op evaluation,  At surgeon's request and post-op pain management  Laterality: Left  Prep: Maximum Sterile Barrier Precautions used, chloraprep       Needles:  Injection technique: Single-shot  Needle Type: Echogenic Stimulator Needle     Needle Length: 4cm  Needle Gauge: 22     Additional Needles:   Procedures:,,,, ultrasound used (permanent image in chart),,,,  Narrative:  Start time: 05/12/2020 11:21 AM End time: 05/12/2020 11:31 AM Injection made incrementally with aspirations every 5 mL.  Performed by: Personally  Anesthesiologist: Freddrick March, MD  Additional Notes: Monitors applied. No increased pain on injection. No increased resistance to injection. Injection made in 5cc increments. Good needle visualization. Patient tolerated procedure well.

## 2020-05-12 NOTE — Discharge Instructions (Signed)
Postoperative instructions:  Dressing instructions: Keep your dressing and/or splint clean and dry at all times.  It will be removed at your first post-operative appointment.  Your stitches and/or staples will be removed at this visit.  Incision instructions:  Do not soak your incision for 3 weeks after surgery.  If the incision gets wet, pat dry and do not scrub the incision.  Pain control:  You have been given a prescription to be taken as directed for post-operative pain control.  In addition, elevate the operative extremity above the heart at all times to prevent swelling and throbbing pain.  Take over-the-counter Colace, 100mg  by mouth twice a day while taking narcotic pain medications to help prevent constipation.  Follow up appointments: 1) 14 days for suture removal and wound check. 2) Dr. Erlinda Hong as scheduled.   -------------------------------------------------------------------------------------------------------------  After Surgery Pain Control:  After your surgery, post-surgical discomfort or pain is likely. This discomfort can last several days to a few weeks. At certain times of the day your discomfort may be more intense.  Did you receive a nerve block?  A nerve block can provide pain relief for one hour to two days after your surgery. As long as the nerve block is working, you will experience little or no sensation in the area the surgeon operated on.  As the nerve block wears off, you will begin to experience pain or discomfort. It is very important that you begin taking your prescribed pain medication before the nerve block fully wears off. Treating your pain at the first sign of the block wearing off will ensure your pain is better controlled and more tolerable when full-sensation returns. Do not wait until the pain is intolerable, as the medicine will be less effective. It is better to treat pain in advance than to try and catch up.  General Anesthesia:  If you did not  receive a nerve block during your surgery, you will need to start taking your pain medication shortly after your surgery and should continue to do so as prescribed by your surgeon.  Pain Medication:  Most commonly we prescribe Vicodin and Percocet for post-operative pain. Both of these medications contain a combination of acetaminophen (Tylenol) and a narcotic to help control pain.   It takes between 30 and 45 minutes before pain medication starts to work. It is important to take your medication before your pain level gets too intense.   Nausea is a common side effect of many pain medications. You will want to eat something before taking your pain medicine to help prevent nausea.   If you are taking a prescription pain medication that contains acetaminophen, we recommend that you do not take additional over the counter acetaminophen (Tylenol).  Other pain relieving options:   Using a cold pack to ice the affected area a few times a day (15 to 20 minutes at a time) can help to relieve pain, reduce swelling and bruising.   Elevation of the affected area can also help to reduce pain and swelling.  No Tylenol until 4:21 pm   Post Anesthesia Home Care Instructions  Activity: Get plenty of rest for the remainder of the day. A responsible individual must stay with you for 24 hours following the procedure.  For the next 24 hours, DO NOT: -Drive a car -Paediatric nurse -Drink alcoholic beverages -Take any medication unless instructed by your physician -Make any legal decisions or sign important papers.  Meals: Start with liquid foods such as gelatin or  soup. Progress to regular foods as tolerated. Avoid greasy, spicy, heavy foods. If nausea and/or vomiting occur, drink only clear liquids until the nausea and/or vomiting subsides. Call your physician if vomiting continues.  Special Instructions/Symptoms: Your throat may feel dry or sore from the anesthesia or the breathing tube placed in  your throat during surgery. If this causes discomfort, gargle with warm salt water. The discomfort should disappear within 24 hours.  If you had a scopolamine patch placed behind your ear for the management of post- operative nausea and/or vomiting:  1. The medication in the patch is effective for 72 hours, after which it should be removed.  Wrap patch in a tissue and discard in the trash. Wash hands thoroughly with soap and water. 2. You may remove the patch earlier than 72 hours if you experience unpleasant side effects which may include dry mouth, dizziness or visual disturbances. 3. Avoid touching the patch. Wash your hands with soap and water after contact with the patch.    Regional Anesthesia Blocks  1. Numbness or the inability to move the "blocked" extremity may last from 3-48 hours after placement. The length of time depends on the medication injected and your individual response to the medication. If the numbness is not going away after 48 hours, call your surgeon.  2. The extremity that is blocked will need to be protected until the numbness is gone and the  Strength has returned. Because you cannot feel it, you will need to take extra care to avoid injury. Because it may be weak, you may have difficulty moving it or using it. You may not know what position it is in without looking at it while the block is in effect.  3. For blocks in the legs and feet, returning to weight bearing and walking needs to be done carefully. You will need to wait until the numbness is entirely gone and the strength has returned. You should be able to move your leg and foot normally before you try and bear weight or walk. You will need someone to be with you when you first try to ensure you do not fall and possibly risk injury.  4. Bruising and tenderness at the needle site are common side effects and will resolve in a few days.  5. Persistent numbness or new problems with movement should be communicated to  the surgeon or the [facility] [phone]).

## 2020-05-12 NOTE — Progress Notes (Signed)
Assisted Dr. Lanetta Inch with left, ultrasound guided, supraclavicular block. Side rails up, monitors on throughout procedure. See vital signs in flow sheet. Tolerated Procedure well.

## 2020-05-12 NOTE — Transfer of Care (Signed)
Immediate Anesthesia Transfer of Care Note  Patient: Danielle Jones  Procedure(s) Performed: LEFT THUMB CARPOMETACARPAL ARTHROPLASTY (Left Thumb)  Patient Location: PACU  Anesthesia Type:MAC combined with regional for post-op pain  Level of Consciousness: drowsy  Airway & Oxygen Therapy: Patient Spontanous Breathing and Patient connected to face mask oxygen  Post-op Assessment: Report given to RN and Post -op Vital signs reviewed and stable  Post vital signs: Reviewed and stable  Last Vitals:  Vitals Value Taken Time  BP 117/54 05/12/20 1404  Temp 36.8 C 05/12/20 1404  Pulse 91 05/12/20 1405  Resp 13 05/12/20 1405  SpO2 96 % 05/12/20 1405  Vitals shown include unvalidated device data.  Last Pain:  Vitals:   05/12/20 1018  TempSrc: Oral  PainSc: 4       Patients Stated Pain Goal: 5 (52/84/13 2440)  Complications: No complications documented.

## 2020-05-12 NOTE — H&P (Signed)
PREOPERATIVE H&P  Chief Complaint: left thumb carpometacarpal osteoarthritis  HPI: Danielle Jones is a 60 y.o. female who presents for surgical treatment of left thumb carpometacarpal osteoarthritis.  She denies any changes in medical history.  Past Medical History:  Diagnosis Date  . Anxiety   . CMC arthritis    left thumb  . Depression   . Gout   . High cholesterol   . Hypertension   . Hypothyroidism    Past Surgical History:  Procedure Laterality Date  . ABDOMINAL HYSTERECTOMY    . Carpal tunnel  2019  . thumb release  2019   Social History   Socioeconomic History  . Marital status: Single    Spouse name: Not on file  . Number of children: Not on file  . Years of education: Not on file  . Highest education level: Not on file  Occupational History  . Not on file  Tobacco Use  . Smoking status: Never Smoker  . Smokeless tobacco: Never Used  Substance and Sexual Activity  . Alcohol use: Yes    Comment: social  . Drug use: No  . Sexual activity: Not on file  Other Topics Concern  . Not on file  Social History Narrative  . Not on file   Social Determinants of Health   Financial Resource Strain: Not on file  Food Insecurity: Not on file  Transportation Needs: Not on file  Physical Activity: Not on file  Stress: Not on file  Social Connections: Not on file   Family History  Problem Relation Age of Onset  . Arthritis Mother   . Alzheimer's disease Mother   . Kidney failure Mother   . Congenital heart disease Father   . Melanoma Father    No Known Allergies Prior to Admission medications   Medication Sig Start Date End Date Taking? Authorizing Provider  allopurinol (ZYLOPRIM) 100 MG tablet 2 po qd 03/18/20  Yes Rip Harbour, NP  atorvastatin (LIPITOR) 20 MG tablet Take 20 mg by mouth daily.   Yes [provider]  ergocalciferol (VITAMIN D2) 1.25 MG (50000 UT) capsule TAKE 1 CAPSULE BY MOUTH 1 TIME A WEEK FOR 12 DOSES 01/07/20  Yes  [provider]  levothyroxine (SYNTHROID) 50 MCG tablet TAKE 1 TABLET(50 MCG) BY MOUTH DAILY 08/20/19  Yes [provider]  meloxicam (MOBIC) 15 MG tablet TAKE 1/2 TABLET(7.5 MG) BY MOUTH TWICE DAILY AS NEEDED FOR PAIN 03/18/20  Yes Rip Harbour, NP  Olmesartan-amLODIPine-HCTZ 40-10-25 MG TABS Take 1 tablet by mouth daily. 03/18/20  Yes Rip Harbour, NP  venlafaxine XR (EFFEXOR-XR) 150 MG 24 hr capsule Take 1 capsule (150 mg total) by mouth daily with breakfast. 03/18/20  Yes Rip Harbour, NP     Positive ROS: All other systems have been reviewed and were otherwise negative with the exception of those mentioned in the HPI and as above.  Physical Exam: General: Alert, no acute distress Cardiovascular: No pedal edema Respiratory: No cyanosis, no use of accessory musculature GI: abdomen soft Skin: No lesions in the area of chief complaint Neurologic: Sensation intact distally Psychiatric: Patient is competent for consent with normal mood and affect Lymphatic: no lymphedema  MUSCULOSKELETAL: exam stable  Assessment: left thumb carpometacarpal osteoarthritis  Plan: Plan for Procedure(s): LEFT THUMB CARPOMETACARPAL ARTHROPLASTY  The risks benefits and alternatives were discussed with the patient including but not limited to the risks of nonoperative treatment, versus surgical intervention including infection, bleeding, nerve injury,  blood clots, cardiopulmonary complications, morbidity, mortality, among others, and they were willing to proceed.   Preoperative templating of the joint replacement has been completed, documented, and submitted to the Operating Room personnel in order to optimize intra-operative equipment management.   Eduard Roux, MD 05/12/2020 11:45 AM

## 2020-05-12 NOTE — Anesthesia Procedure Notes (Signed)
Date/Time: 05/12/2020 12:09 PM Performed by: Glory Buff, CRNA Oxygen Delivery Method: Simple face mask

## 2020-05-12 NOTE — Anesthesia Preprocedure Evaluation (Addendum)
Anesthesia Evaluation  Patient identified by MRN, date of birth, ID band Patient awake    Reviewed: Allergy & Precautions, NPO status , Patient's Chart, lab work & pertinent test results  Airway Mallampati: III  TM Distance: >3 FB Neck ROM: Full  Mouth opening: Limited Mouth Opening  Dental no notable dental hx. (+) Teeth Intact, Dental Advisory Given   Pulmonary neg pulmonary ROS,    Pulmonary exam normal breath sounds clear to auscultation       Cardiovascular hypertension, Pt. on medications negative cardio ROS Normal cardiovascular exam Rhythm:Regular Rate:Normal     Neuro/Psych PSYCHIATRIC DISORDERS Anxiety Depression negative neurological ROS     GI/Hepatic negative GI ROS, Neg liver ROS,   Endo/Other  Hypothyroidism Morbid obesity (BMI 40)  Renal/GU negative Renal ROS  negative genitourinary   Musculoskeletal  (+) Arthritis ,   Abdominal   Peds  Hematology negative hematology ROS (+)   Anesthesia Other Findings   Reproductive/Obstetrics                            Anesthesia Physical Anesthesia Plan  ASA: III  Anesthesia Plan: MAC and Regional   Post-op Pain Management:  Regional for Post-op pain   Induction: Intravenous  PONV Risk Score and Plan: 2 and Propofol infusion, Treatment may vary due to age or medical condition, Midazolam, Ondansetron and Dexamethasone  Airway Management Planned: Natural Airway  Additional Equipment:   Intra-op Plan:   Post-operative Plan:   Informed Consent: I have reviewed the patients History and Physical, chart, labs and discussed the procedure including the risks, benefits and alternatives for the proposed anesthesia with the patient or authorized representative who has indicated his/her understanding and acceptance.     Dental advisory given  Plan Discussed with: CRNA  Anesthesia Plan Comments:        Anesthesia Quick  Evaluation

## 2020-05-12 NOTE — Anesthesia Postprocedure Evaluation (Signed)
Anesthesia Post Note  Patient: Danielle Jones  Procedure(s) Performed: LEFT THUMB CARPOMETACARPAL ARTHROPLASTY (Left Thumb)     Patient location during evaluation: PACU Anesthesia Type: Regional and MAC Level of consciousness: awake and alert Pain management: pain level controlled Vital Signs Assessment: post-procedure vital signs reviewed and stable Respiratory status: spontaneous breathing, nonlabored ventilation, respiratory function stable and patient connected to nasal cannula oxygen Cardiovascular status: stable and blood pressure returned to baseline Postop Assessment: no apparent nausea or vomiting Anesthetic complications: no   No complications documented.  Last Vitals:  Vitals:   05/12/20 1415 05/12/20 1447  BP: 117/62 (!) 130/59  Pulse: 87 81  Resp: 18 16  Temp:  36.6 C  SpO2: 95% 94%    Last Pain:  Vitals:   05/12/20 1447  TempSrc:   PainSc: 0-No pain                 [name] Record

## 2020-05-19 ENCOUNTER — Ambulatory Visit (INDEPENDENT_AMBULATORY_CARE_PROVIDER_SITE_OTHER): Payer: 59

## 2020-05-19 ENCOUNTER — Other Ambulatory Visit: Payer: Self-pay

## 2020-05-19 ENCOUNTER — Ambulatory Visit (INDEPENDENT_AMBULATORY_CARE_PROVIDER_SITE_OTHER): Payer: 59 | Admitting: Orthopaedic Surgery

## 2020-05-19 DIAGNOSIS — M79645 Pain in left finger(s): Secondary | ICD-10-CM

## 2020-05-19 MED ORDER — HYDROCODONE-ACETAMINOPHEN 5-325 MG PO TABS: 1.0000 | ORAL_TABLET | Freq: Every day | ORAL | 0 refills | Status: DC | PRN

## 2020-05-19 NOTE — Progress Notes (Signed)
   Post-Op Visit Note   Patient: Danielle Jones           Date of Birth: [birth year]           MRN: [id number] Visit Date: 05/19/2020 PCP: Rip Harbour, NP   Assessment & Plan:  Chief Complaint:  Chief Complaint  Patient presents with  . Left Hand - Routine Post Op    05/12/20 left thumb carpometacarpal arthroplasty    Visit Diagnoses:  1. Pain of left thumb     Plan:   [name] is 1 week status post left LR TI.  Overall doing well.  She has been taking the prescription pain medications twice daily.  No complaints otherwise.  Hand shows mild swelling.  Slight decrease sensation around the surgical sites.  No signs of infection.  No neurovascular compromise.  X-rays are unremarkable.  We placed in a thumb spica brace to wear at all times except during hygiene.  Incisions were covered with a dry dressing.  We will see her back in a week for suture removal.  Norco refilled today to use sparingly.   Follow-Up Instructions: Return in about 1 week (around 05/26/2020).   Orders:  Orders Placed This Encounter  Procedures  . XR Hand Complete Left   No orders of the defined types were placed in this encounter.   Imaging: XR Hand Complete Left  Result Date: 05/19/2020 Status post trapeziectomy and LR TI   PMFS History: Patient Active Problem List   Diagnosis Date Noted  . Gout 02/05/2020  . Secondary hypertension 02/05/2020  . Vitamin B deficiency 02/05/2020  . Hypothyroidism 02/05/2020  . Anxiety 02/05/2020  . Depression 02/05/2020  . Hand pain, left 02/05/2020  . Family history of malignant melanoma 02/05/2020  . Elevated hemoglobin A1c 01/02/2019  . Encounter for vitamin deficiency screening 01/02/2019  . Low vitamin B12 level 01/02/2019  . Primary osteoarthritis of first carpometacarpal joint of left hand 09/25/2018  . GAD (generalized anxiety disorder) 04/10/2018  . Hyperlipidemia 04/10/2018  . Carpal tunnel syndrome of left wrist 08/17/2017   Past Medical  History:  Diagnosis Date  . Anxiety   . CMC arthritis    left thumb  . Depression   . Gout   . High cholesterol   . Hypertension   . Hypothyroidism     Family History  Problem Relation Age of Onset  . Arthritis Mother   . Alzheimer's disease Mother   . Kidney failure Mother   . Congenital heart disease Father   . Melanoma Father     Past Surgical History:  Procedure Laterality Date  . ABDOMINAL HYSTERECTOMY    . Carpal tunnel  2019  . CARPOMETACARPEL SUSPENSION PLASTY Left 05/12/2020   Procedure: LEFT THUMB CARPOMETACARPAL ARTHROPLASTY;  Surgeon: Leandrew Koyanagi, MD;  Location: [facility];  Service: Orthopedics;  Laterality: Left;  . thumb release  2019   Social History   Occupational History  . Not on file  Tobacco Use  . Smoking status: Never Smoker  . Smokeless tobacco: Never Used  Substance and Sexual Activity  . Alcohol use: Yes    Comment: social  . Drug use: No  . Sexual activity: Not on file

## 2020-05-28 ENCOUNTER — Encounter: Payer: Self-pay | Admitting: Orthopaedic Surgery

## 2020-05-28 ENCOUNTER — Ambulatory Visit (INDEPENDENT_AMBULATORY_CARE_PROVIDER_SITE_OTHER): Payer: 59

## 2020-05-28 ENCOUNTER — Ambulatory Visit (INDEPENDENT_AMBULATORY_CARE_PROVIDER_SITE_OTHER): Payer: 59 | Admitting: Orthopaedic Surgery

## 2020-05-28 VITALS — Ht 63.0 in | Wt 223.0 lb

## 2020-05-28 DIAGNOSIS — M79642 Pain in left hand: Secondary | ICD-10-CM

## 2020-05-28 NOTE — Progress Notes (Signed)
   Post-Op Visit Note   Patient: Danielle Jones           Date of Birth: Jan 31, 1961           MRN: [id number] Visit Date: 05/28/2020 PCP: Rip Harbour, NP   Assessment & Plan:  Chief Complaint:  Chief Complaint  Patient presents with  . Left Hand - Follow-up    Left thumb CMC arthroplasty 05/12/2020   Visit Diagnoses:  1. Pain in left hand     Plan: Patient is a pleasant 60 year old female who comes in today 2 weeks out left thumb CMC arthroplasty 05/12/2020.  She has been doing well.  She has been compliant wearing her removable thumb spica splint.  She is taking narcotic pain medication as needed, primarily at night.  Examination of her left hand reveals well-healing surgical incisions with nylon sutures in place.  No evidence of infection or cellulitis.  She does have decreased sensation to the dorsum of the hand.  She is able to wiggle all 5 fingers.  Fingers are also warm and well-perfused.  Today, sutures were removed and Steri-Strips applied.  Thumb spica cast applied.  Follow-up with [place] in 2 weeks time for recheck and initiation of hand therapy.  Call with concerns or questions in the meantime.  Follow-Up Instructions: Return in about 2 weeks (around 06/11/2020).   Orders:  Orders Placed This Encounter  Procedures  . XR Hand Complete Left   No orders of the defined types were placed in this encounter.   Imaging: XR Hand Complete Left  Result Date: 05/28/2020 Stable fixation of the implant without complication   PMFS History: Patient Active Problem List   Diagnosis Date Noted  . Gout 02/05/2020  . Secondary hypertension 02/05/2020  . Vitamin B deficiency 02/05/2020  . Hypothyroidism 02/05/2020  . Anxiety 02/05/2020  . Depression 02/05/2020  . Hand pain, left 02/05/2020  . Family history of malignant melanoma 02/05/2020  . Elevated hemoglobin A1c 01/02/2019  . Encounter for vitamin deficiency screening 01/02/2019  . Low vitamin B12 level 01/02/2019  .  Primary osteoarthritis of first carpometacarpal joint of left hand 09/25/2018  . GAD (generalized anxiety disorder) 04/10/2018  . Hyperlipidemia 04/10/2018  . Carpal tunnel syndrome of left wrist 08/17/2017   Past Medical History:  Diagnosis Date  . Anxiety   . CMC arthritis    left thumb  . Depression   . Gout   . High cholesterol   . Hypertension   . Hypothyroidism     Family History  Problem Relation Age of Onset  . Arthritis Mother   . Alzheimer's disease Mother   . Kidney failure Mother   . Congenital heart disease Father   . Melanoma Father     Past Surgical History:  Procedure Laterality Date  . ABDOMINAL HYSTERECTOMY    . Carpal tunnel  2019  . CARPOMETACARPEL SUSPENSION PLASTY Left 05/12/2020   Procedure: LEFT THUMB CARPOMETACARPAL ARTHROPLASTY;  Surgeon: Leandrew Koyanagi, MD;  Location: [facility];  Service: Orthopedics;  Laterality: Left;  . thumb release  2019   Social History   Occupational History  . Not on file  Tobacco Use  . Smoking status: Never Smoker  . Smokeless tobacco: Never Used  Substance and Sexual Activity  . Alcohol use: Yes    Comment: social  . Drug use: No  . Sexual activity: Not on file

## 2020-06-11 ENCOUNTER — Ambulatory Visit (INDEPENDENT_AMBULATORY_CARE_PROVIDER_SITE_OTHER): Payer: 59 | Admitting: Orthopaedic Surgery

## 2020-06-11 ENCOUNTER — Ambulatory Visit (INDEPENDENT_AMBULATORY_CARE_PROVIDER_SITE_OTHER): Payer: 59

## 2020-06-11 ENCOUNTER — Encounter: Payer: Self-pay | Admitting: Orthopaedic Surgery

## 2020-06-11 DIAGNOSIS — M79642 Pain in left hand: Secondary | ICD-10-CM

## 2020-06-11 NOTE — Progress Notes (Signed)
   Post-Op Visit Note   Patient: Danielle Jones           Date of Birth: 22-Jun-1960           MRN: [id number] Visit Date: 06/11/2020 PCP: Rip Harbour, NP   Assessment & Plan:  Chief Complaint:  Chief Complaint  Patient presents with  . Left Hand - Pain   Visit Diagnoses:  1. Pain in left hand     Plan:   [place] is a 4-week status post left thumb CMC arthroplasty.  She is overall doing okay.  Pain is mild.  Surgical incisions are all healed.  No signs of infection.  Steri-Strips removed.  X-rays are unremarkable.  At this point we will send a referral to hand therapy and for a hand-based splint.  She will try to do most of the rehab at home due to concerns of cost associated with hand therapy.  We will recheck her in 4 weeks.  Follow-Up Instructions: Return in about 4 weeks (around 07/09/2020).   Orders:  Orders Placed This Encounter  Procedures  . XR Hand Complete Left  . Ambulatory referral to Occupational Therapy   No orders of the defined types were placed in this encounter.   Imaging: XR Hand Complete Left  Result Date: 06/11/2020 Stable changes consistent with recent [facility] arthroplasty surgery.   PMFS History: Patient Active Problem List   Diagnosis Date Noted  . Gout 02/05/2020  . Secondary hypertension 02/05/2020  . Vitamin B deficiency 02/05/2020  . Hypothyroidism 02/05/2020  . Anxiety 02/05/2020  . Depression 02/05/2020  . Hand pain, left 02/05/2020  . Family history of malignant melanoma 02/05/2020  . Elevated hemoglobin A1c 01/02/2019  . Encounter for vitamin deficiency screening 01/02/2019  . Low vitamin B12 level 01/02/2019  . Primary osteoarthritis of first carpometacarpal joint of left hand 09/25/2018  . GAD (generalized anxiety disorder) 04/10/2018  . Hyperlipidemia 04/10/2018  . Carpal tunnel syndrome of left wrist 08/17/2017   Past Medical History:  Diagnosis Date  . Anxiety   . CMC arthritis    left thumb  . Depression   .  Gout   . High cholesterol   . Hypertension   . Hypothyroidism     Family History  Problem Relation Age of Onset  . Arthritis Mother   . Alzheimer's disease Mother   . Kidney failure Mother   . Congenital heart disease Father   . Melanoma Father     Past Surgical History:  Procedure Laterality Date  . ABDOMINAL HYSTERECTOMY    . Carpal tunnel  2019  . CARPOMETACARPEL SUSPENSION PLASTY Left 05/12/2020   Procedure: LEFT THUMB CARPOMETACARPAL ARTHROPLASTY;  Surgeon: Leandrew Koyanagi, MD;  Location: [facility];  Service: Orthopedics;  Laterality: Left;  . thumb release  2019   Social History   Occupational History  . Not on file  Tobacco Use  . Smoking status: Never Smoker  . Smokeless tobacco: Never Used  Substance and Sexual Activity  . Alcohol use: Yes    Comment: social  . Drug use: No  . Sexual activity: Not on file

## 2020-06-18 ENCOUNTER — Other Ambulatory Visit: Payer: Self-pay | Admitting: Nurse Practitioner

## 2020-06-18 DIAGNOSIS — I10 Essential (primary) hypertension: Secondary | ICD-10-CM

## 2020-06-21 ENCOUNTER — Other Ambulatory Visit: Payer: Self-pay | Admitting: Physician Assistant

## 2020-06-21 ENCOUNTER — Telehealth: Payer: Self-pay | Admitting: Orthopaedic Surgery

## 2020-06-21 MED ORDER — HYDROCODONE-ACETAMINOPHEN 5-325 MG PO TABS: 1.0000 | ORAL_TABLET | Freq: Every day | ORAL | 0 refills | Status: DC | PRN

## 2020-06-21 NOTE — Telephone Encounter (Signed)
Need to wean to norco and I just sent this in

## 2020-06-21 NOTE — Telephone Encounter (Signed)
Pt called asking for a refill of her percocet 5-325 mg; she would like to be notified when this is called in.   [phone]

## 2020-06-21 NOTE — Telephone Encounter (Signed)
Patient aware.

## 2020-06-22 ENCOUNTER — Ambulatory Visit: Payer: 59 | Admitting: Occupational Therapy

## 2020-06-22 ENCOUNTER — Other Ambulatory Visit: Payer: Self-pay

## 2020-06-22 ENCOUNTER — Ambulatory Visit: Payer: 59 | Attending: Orthopaedic Surgery | Admitting: Occupational Therapy

## 2020-06-22 DIAGNOSIS — M25542 Pain in joints of left hand: Secondary | ICD-10-CM

## 2020-06-22 DIAGNOSIS — R600 Localized edema: Secondary | ICD-10-CM | POA: Diagnosis present

## 2020-06-22 DIAGNOSIS — M6281 Muscle weakness (generalized): Secondary | ICD-10-CM

## 2020-06-22 DIAGNOSIS — R278 Other lack of coordination: Secondary | ICD-10-CM | POA: Diagnosis present

## 2020-06-22 DIAGNOSIS — M25642 Stiffness of left hand, not elsewhere classified: Secondary | ICD-10-CM | POA: Diagnosis present

## 2020-06-22 NOTE — Therapy (Signed)
[facility] [address] [place], [place], [address] Phone: [phone]   Fax:  [phone]  Occupational Therapy Evaluation  Patient Details  Name: Danielle Jones MRN: [id number] Date of Birth: 1960/02/22 Referring Provider (OT): Dr. Frankey Shown   Encounter Date: 06/22/2020   OT End of Session - 06/22/20 1253    Visit Number 1    Number of Visits 8    Date for OT Re-Evaluation 08/22/20    Authorization Type [employer]    OT Start Time 1015    OT Stop Time 1125    OT Time Calculation (min) 70 min    Activity Tolerance Patient tolerated treatment well    Behavior During Therapy [facility] for tasks assessed/performed           Past Medical History:  Diagnosis Date  . Anxiety   . CMC arthritis    left thumb  . Depression   . Gout   . High cholesterol   . Hypertension   . Hypothyroidism     Past Surgical History:  Procedure Laterality Date  . ABDOMINAL HYSTERECTOMY    . Carpal tunnel  2019  . CARPOMETACARPEL SUSPENSION PLASTY Left 05/12/2020   Procedure: LEFT THUMB CARPOMETACARPAL ARTHROPLASTY;  Surgeon: Leandrew Koyanagi, MD;  Location: [facility];  Service: Orthopedics;  Laterality: Left;  . thumb release  2019    There were no vitals filed for this visit.   Subjective Assessment - 06/22/20 1027    Pertinent History LT thumb arthroplasty 05/12/20. PMH: OA, prior Lt thumb CMC joint capsulorraphy    Limitations per protocol    Currently in Pain? Yes    Pain Score 4     Pain Location --   thumb   Pain Orientation Left    Pain Descriptors / Indicators Tender;Aching;Stabbing   stabbing infrequently   Pain Type Acute pain    Pain Onset More than a month ago    Pain Frequency Intermittent    Aggravating Factors  lifting (therapist instructed not to do)    Pain Relieving Factors hot water, elevation, hot and cold (alternating)             [facility] OT Assessment - 06/22/20 0001       Assessment   Medical Diagnosis s/p Lt thumb CMC interposition arthroplasty    Referring Provider (OT) Dr. Frankey Shown    Onset Date/Surgical Date 05/12/20    Hand Dominance Right      Precautions   Precautions Other (comment)    Precaution Comments per protocol    Required Braces or Orthoses Other Brace/Splint    Other Brace/Splint hand based CMC thumb spica splint      Restrictions   Weight Bearing Restrictions Yes    LUE Weight Bearing Non weight bearing      Home  Environment   Additional Comments Pt lives w/ husband, 72 y.o. and 69 y.o sons (35 y.o. is disabled/blind)    Lives With Family      Prior Function   Level of Independence Independent    Vocation --   on long term disability     ADL   ADL comments Pt mod I for all BADLS, requires assist for all IADLS     Written Expression   Dominant Hand Right    Handwriting --   INTACT     Edema   Edema Mild Lt thumb/wrist      ROM / Strength  AROM / PROM / Strength AROM      AROM   Overall AROM Comments Did not formally assess d/t time contraints, however pt can oppose Lt thumb to 4th digit. [place] abduction only slightly limited                    OT Treatments/Exercises (OP) - 06/22/20 0001      ADLs   ADL Comments Discussed precautions at length including at this time no strengthening (lifting, pushing, pulling, resistive, or wt bearing), hygiene care and splint wear and care, as well as showing pt scar massage (which she reports is already doing) and desensization techniques.      Exercises   Exercises --   Thumb A/ROM HEP issued - see pt instructions for details     Splinting   Splinting Fabricated and fitted hand based thumb spica splint per MD orders. Issued splint and reviewed wear and care.                 OT Education - 06/22/20 1310    Education Details Precautions, hand hygiene, splint wear and care, initial HEP, scar massage and desensitization techniques    Person(s) Educated  Patient    Methods Explanation;Demonstration;Handout    Comprehension Verbalized understanding;Returned demonstration            OT Short Term Goals - 06/22/20 1304      OT SHORT TERM GOAL #1   Title Independent with splint wear and care    Time 2    Period Weeks    Status On-going      OT SHORT TERM GOAL #2   Title Independent with initial HEP    Time 4    Period Weeks    Status On-going      OT SHORT TERM GOAL #3   Title Independent w/ scar massage and desensitization techniques    Time 4    Period Weeks    Status On-going      OT SHORT TERM GOAL #4   Title Pt to demo thumb opposition to 5th digit    Baseline to 4th digit    Time 4    Period Weeks    Status New      OT SHORT TERM GOAL #5   Title Pt to use Lt hand to assist w/ BADLS    Time 4    Period Weeks    Status New             OT Long Term Goals - 06/22/20 1306      OT LONG TERM GOAL #1   Title Independent with updated strengthening HEP    Time 8    Period Weeks    Status New      OT LONG TERM GOAL #2   Title Lt thumb ROM to be WFL's for necessary tasks    Time 8    Period Weeks    Status New      OT LONG TERM GOAL #3   Title Grip strength to be 30 lbs or greater Lt hand to assist with opening tight jars/containers    Time 8    Period Weeks    Status New      OT LONG TERM GOAL #4   Title Pain to be less than or equal to 3/10 with all functional tasks    Time 8    Period Weeks    Status New      OT LONG  TERM GOAL #5   Title Pt to return to all IADLS at mod I level    Time 8    Period Weeks    Status New                 Plan - 06/22/20 1256    Clinical Impression Statement Pt is a 60 y.o. female who presents to OPOT for evaluation and splinting s/p Lt thumb CMC interposition arthroplasty on 05/12/20. Pt is now almost 6 weeks post -op. Pt referred to O.T. for fabrication of hand based thumb spica splint and arrives today with no protection. However pt was in soft post  surgical cast following surgery and then moved into pre-fab thumb spica wrist brace following that. Fabricated and fitted hand based thumb spica splint and issued A/ROM HEP for thumb today.    OT Occupational Profile and History Problem Focused Assessment - Including review of records relating to presenting problem    Occupational performance deficits (Please refer to evaluation for details): ADL's;IADL's;Leisure    Body Structure / Function / Physical Skills ADL;Strength;Dexterity;Pain;UE functional use;Edema;IADL;ROM;Scar mobility;Sensation;Coordination;FMC    Rehab Potential Good    Clinical Decision Making Several treatment options, min-mod task modification necessary    Comorbidities Affecting Occupational Performance: May have comorbidities impacting occupational performance    Modification or Assistance to Complete Evaluation  No modification of tasks or assist necessary to complete eval    OT Frequency 1x / week    OT Duration 8 weeks    OT Treatment/Interventions Self-care/ADL training;Moist Heat;Fluidtherapy;DME and/or AE instruction;Splinting;Contrast Bath;Compression bandaging;Therapeutic activities;Therapeutic exercise;Ultrasound;Scar mobilization;Cryotherapy;Passive range of motion;Electrical Stimulation;Paraffin;Manual Therapy;Patient/family education    Plan splint adjustments prn (can go into neoprene CMC brace if this splint causes problems), review A/ROM HEP, issued P/ROM HEP    Consulted and Agree with Plan of Care Patient           Patient will benefit from skilled therapeutic intervention in order to improve the following deficits and impairments:   Body Structure / Function / Physical Skills: ADL,Strength,Dexterity,Pain,UE functional use,Edema,IADL,ROM,Scar [place]       Visit Diagnosis: Stiffness of left hand, not elsewhere classified  Pain in thumb joint with movement of left hand  Muscle weakness (generalized)  Other lack of  coordination  Localized edema    Problem List Patient Active Problem List   Diagnosis Date Noted  . Gout 02/05/2020  . Secondary hypertension 02/05/2020  . Vitamin B deficiency 02/05/2020  . Hypothyroidism 02/05/2020  . Anxiety 02/05/2020  . Depression 02/05/2020  . Hand pain, left 02/05/2020  . Family history of malignant melanoma 02/05/2020  . Elevated hemoglobin A1c 01/02/2019  . Encounter for vitamin deficiency screening 01/02/2019  . Low vitamin B12 level 01/02/2019  . Primary osteoarthritis of first carpometacarpal joint of left hand 09/25/2018  . GAD (generalized anxiety disorder) 04/10/2018  . Hyperlipidemia 04/10/2018  . Carpal tunnel syndrome of left wrist 08/17/2017    Carey Bullocks, OTR/L 06/22/2020, 1:36 PM  [facility] [address] [place], [place], [address] Phone: [phone]   Fax:  [phone]  Name: Danielle Jones MRN: [id number] Date of Birth: 12/31/1960

## 2020-06-22 NOTE — Patient Instructions (Addendum)
WEARING SCHEDULE:  Wear splint at ALL times except for hygiene care (May remove splint for exercises and then immediately place back on as directed by the therapist)  PURPOSE:  To prevent movement and for protection until injury can heal  CARE OF SPLINT:  Keep splint away from heat sources including: stove, radiator or furnace, or a car in sunlight. The splint can melt and will no longer fit you properly  Keep away from pets and children  Clean the splint with rubbing alcohol 1-2 times per day.  * During this time, make sure you also clean your hand/arm as instructed by your therapist and/or perform dressing changes as needed. Then dry hand/arm completely before replacing splint. (When cleaning hand/arm, keep it immobilized in same position until splint is replaced)  PRECAUTIONS/POTENTIAL PROBLEMS: *If you notice or experience increased pain, swelling, numbness, or a lingering reddened area from the splint: Contact your therapist immediately by calling [phone]. You must wear the splint for protection, but we will get you scheduled for adjustments as quickly as possible.  (If only straps or hooks need to be replaced and NO adjustments to the splint need to be made, just call the office ahead and let them know you are coming in)  If you have any medical concerns or signs of infection, please call your doctor immediately     Opposition (Active)   Touch tip of thumb to nail tip of each finger in turn, making an "O" shape. Repeat __10__ times. Do _4-6___ sessions per day.     MP Flexion (Active)   Bend thumb to touch base of little finger, keeping tip joint straight. Repeat __10-15__ times. Do _4-6___ sessions per day.    Composite Extension (Active)   Bring thumb up and out in hitchhiker position.  Repeat __10-15__ times. Do _4-6___ sessions per day.    Palmar Adduction/Abduction (Active)    Move thumb in front of palm. Move back to rest along palm. Repeat __10-15__  times. Do _4-6___ sessions per day.   Composite Movement Circumduction (Active)    Make circles with thumb. Repeat _10___ times. Do __4-6__ sessions per day.   P Flexion (Active Blocked)   Brace thumb below tip joint. Bend joint as far as possible. Repeat __10__ times. Do _4-6___ sessions per day.

## 2020-06-30 ENCOUNTER — Other Ambulatory Visit: Payer: Self-pay

## 2020-06-30 ENCOUNTER — Encounter: Payer: Self-pay | Admitting: Occupational Therapy

## 2020-06-30 ENCOUNTER — Ambulatory Visit: Payer: 59 | Attending: Orthopaedic Surgery | Admitting: Occupational Therapy

## 2020-06-30 DIAGNOSIS — M25642 Stiffness of left hand, not elsewhere classified: Secondary | ICD-10-CM | POA: Diagnosis not present

## 2020-06-30 DIAGNOSIS — M6281 Muscle weakness (generalized): Secondary | ICD-10-CM

## 2020-06-30 DIAGNOSIS — R278 Other lack of coordination: Secondary | ICD-10-CM

## 2020-06-30 DIAGNOSIS — R600 Localized edema: Secondary | ICD-10-CM | POA: Diagnosis present

## 2020-06-30 DIAGNOSIS — M25542 Pain in joints of left hand: Secondary | ICD-10-CM | POA: Diagnosis present

## 2020-06-30 NOTE — Therapy (Signed)
[facility] [address] [place], [place], [address] Phone: [phone]   Fax:  [phone]  Occupational Therapy Treatment  Patient Details  Name: Danielle Jones MRN: [id number] Date of Birth: 1960/11/30 Referring Provider (OT): Dr. Frankey Shown   Encounter Date: 06/30/2020   OT End of Session - 06/30/20 1324    Visit Number 2    Number of Visits 8    Date for OT Re-Evaluation 08/22/20    Authorization Type [employer]    OT Start Time 1230    OT Stop Time 1313    OT Time Calculation (min) 43 min    Activity Tolerance Patient tolerated treatment well    Behavior During Therapy [facility] for tasks assessed/performed           Past Medical History:  Diagnosis Date  . Anxiety   . CMC arthritis    left thumb  . Depression   . Gout   . High cholesterol   . Hypertension   . Hypothyroidism     Past Surgical History:  Procedure Laterality Date  . ABDOMINAL HYSTERECTOMY    . Carpal tunnel  2019  . CARPOMETACARPEL SUSPENSION PLASTY Left 05/12/2020   Procedure: LEFT THUMB CARPOMETACARPAL ARTHROPLASTY;  Surgeon: Leandrew Koyanagi, MD;  Location: [facility];  Service: Orthopedics;  Laterality: Left;  . thumb release  2019    There were no vitals filed for this visit.   Subjective Assessment - 06/30/20 1318    Subjective  Patient reports custom splint very comfortable but blocks ability to oppose thumb to fingers.  Patient issued Neoprene CMC Restriction Splint.    Pertinent History LT thumb arthroplasty 05/12/20. PMH: OA, prior Lt thumb CMC joint capsulorraphy    Limitations per protocol    Currently in Pain? No/denies    Pain Score 0-No pain    Pain Orientation Left    Pain Descriptors / Indicators Aching;Stabbing    Pain Type Acute pain    Pain Onset More than a month ago    Pain Frequency Intermittent    Aggravating Factors  use    Pain Relieving Factors heat, elevation, alternating hot and  cold                        OT Treatments/Exercises (OP) - 06/30/20 0001      Exercises   Exercises Hand      Hand Exercises   Other Hand Exercises Patient issued thumb AROM exercises last visit.  Reviewed exercises and added gentle PROM component.  Patient able to replicate.      Modalities   Modalities Fluidotherapy      LUE Fluidotherapy   Number Minutes Fluidotherapy 10 Minutes    LUE Fluidotherapy Location Hand;Wrist;Forearm    Comments prior to stretch/exercise      Splinting   Splinting Issued CMC Restriction splint - neoprene.  Instructed patient she could begin to wean from splint over next several weeks.  Currently can come out of splint at quiet times, for bathing, hygiene, exercise.      Manual Therapy   Manual Therapy Edema management    Edema Management Demonstrated elevation and edema massage.  Also discussed pumping mechanism to reduce inflammation. Patient able to demonstrate effectively.                  OT Education - 06/30/20 1323    Education Details Added gentle PROM to left thumb, reviewed  and practiced elevation and edema management    Person(s) Educated Patient    Methods Explanation;Demonstration;Tactile cues;Verbal cues    Comprehension Verbalized understanding;Returned demonstration            OT Short Term Goals - 06/30/20 1326      OT SHORT TERM GOAL #1   Title Independent with splint wear and care    Time 2    Period Weeks    Status Achieved      OT SHORT TERM GOAL #2   Title Independent with initial HEP    Time 4    Period Weeks    Status Achieved      OT SHORT TERM GOAL #3   Title Independent w/ scar massage and desensitization techniques    Time 4    Period Weeks    Status On-going      OT SHORT TERM GOAL #4   Title Pt to demo thumb opposition to 5th digit    Baseline to 4th digit    Time 4    Period Weeks    Status On-going      OT SHORT TERM GOAL #5   Title Pt to use Lt hand to assist w/ BADLS     Time 4    Period Weeks    Status On-going             OT Long Term Goals - 06/30/20 1327      OT LONG TERM GOAL #1   Title Independent with updated strengthening HEP    Time 8    Period Weeks    Status On-going      OT LONG TERM GOAL #2   Title Lt thumb ROM to be WFL's for necessary tasks    Time 8    Period Weeks    Status On-going      OT LONG TERM GOAL #3   Title Grip strength to be 30 lbs or greater Lt hand to assist with opening tight jars/containers    Time 8    Period Weeks    Status On-going      OT LONG TERM GOAL #4   Title Pain to be less than or equal to 3/10 with all functional tasks    Time 8    Period Weeks    Status On-going      OT LONG TERM GOAL #5   Title Pt to return to all IADLS at mod I level    Time 8    Period Weeks    Status On-going                 Plan - 06/30/20 1325    Clinical Impression Statement Pt progressing nicely toward her goals.  Patient regularly completing HEP.    OT Occupational Profile and History Problem Focused Assessment - Including review of records relating to presenting problem    Occupational performance deficits (Please refer to evaluation for details): ADL's;IADL's;Leisure    Body Structure / Function / Physical Skills ADL;Strength;Dexterity;Pain;UE functional use;Edema;IADL;ROM;Scar mobility;Sensation;Coordination;FMC    Rehab Potential Good    Clinical Decision Making Several treatment options, min-mod task modification necessary    Comorbidities Affecting Occupational Performance: May have comorbidities impacting occupational performance    Modification or Assistance to Complete Evaluation  No modification of tasks or assist necessary to complete eval    OT Frequency 1x / week    OT Duration 8 weeks    OT Treatment/Interventions Self-care/ADL training;Moist Heat;Fluidtherapy;DME and/or AE instruction;Splinting;Contrast  Bath;Compression bandaging;Therapeutic activities;Therapeutic  exercise;Ultrasound;Scar mobilization;Cryotherapy;Passive range of motion;Electrical Stimulation;Paraffin;Manual Therapy;Patient/family education    Plan review A/ROM HEP, issued P/ROM HEP - Continue weaning from brace - light strengthening    Consulted and Agree with Plan of Care Patient           Patient will benefit from skilled therapeutic intervention in order to improve the following deficits and impairments:   Body Structure / Function / Physical Skills: ADL,Strength,Dexterity,Pain,UE functional use,Edema,IADL,ROM,Scar [place]       Visit Diagnosis: Stiffness of left hand, not elsewhere classified  Pain in thumb joint with movement of left hand  Muscle weakness (generalized)  Other lack of coordination  Localized edema    Problem List Patient Active Problem List   Diagnosis Date Noted  . Gout 02/05/2020  . Secondary hypertension 02/05/2020  . Vitamin B deficiency 02/05/2020  . Hypothyroidism 02/05/2020  . Anxiety 02/05/2020  . Depression 02/05/2020  . Hand pain, left 02/05/2020  . Family history of malignant melanoma 02/05/2020  . Elevated hemoglobin A1c 01/02/2019  . Encounter for vitamin deficiency screening 01/02/2019  . Low vitamin B12 level 01/02/2019  . Primary osteoarthritis of first carpometacarpal joint of left hand 09/25/2018  . GAD (generalized anxiety disorder) 04/10/2018  . Hyperlipidemia 04/10/2018  . Carpal tunnel syndrome of left wrist 08/17/2017    Mariah Milling, OTR/L 06/30/2020, 1:29 PM  [facility] [address] [place], [place], [address] Phone: [phone]   Fax:  [phone]  Name: Danielle Jones MRN: [id number] Date of Birth: [birth year]

## 2020-07-07 ENCOUNTER — Ambulatory Visit: Payer: 59 | Admitting: Occupational Therapy

## 2020-07-07 ENCOUNTER — Other Ambulatory Visit: Payer: Self-pay

## 2020-07-07 DIAGNOSIS — M25642 Stiffness of left hand, not elsewhere classified: Secondary | ICD-10-CM | POA: Diagnosis not present

## 2020-07-07 DIAGNOSIS — M6281 Muscle weakness (generalized): Secondary | ICD-10-CM

## 2020-07-07 DIAGNOSIS — R278 Other lack of coordination: Secondary | ICD-10-CM

## 2020-07-07 DIAGNOSIS — M25542 Pain in joints of left hand: Secondary | ICD-10-CM

## 2020-07-07 NOTE — Therapy (Signed)
[facility] [address] [place], [place], [address] Phone: [phone]   Fax:  [phone]  Occupational Therapy Treatment  Patient Details  Name: Danielle Jones MRN: [id number] Date of Birth: March 19, 1960 Referring Provider (OT): Dr. Frankey Shown   Encounter Date: 07/07/2020   OT End of Session - 07/07/20 1425    Visit Number 3    Number of Visits 8    Date for OT Re-Evaluation 08/22/20    Authorization Type [employer]    OT Start Time 1230    OT Stop Time 1310    OT Time Calculation (min) 40 min    Activity Tolerance Patient tolerated treatment well    Behavior During Therapy [facility] for tasks assessed/performed           Past Medical History:  Diagnosis Date  . Anxiety   . CMC arthritis    left thumb  . Depression   . Gout   . High cholesterol   . Hypertension   . Hypothyroidism     Past Surgical History:  Procedure Laterality Date  . ABDOMINAL HYSTERECTOMY    . Carpal tunnel  2019  . CARPOMETACARPEL SUSPENSION PLASTY Left 05/12/2020   Procedure: LEFT THUMB CARPOMETACARPAL ARTHROPLASTY;  Surgeon: Leandrew Koyanagi, MD;  Location: [facility];  Service: Orthopedics;  Laterality: Left;  . thumb release  2019    There were no vitals filed for this visit.   Subjective Assessment - 07/07/20 1307    Subjective  I had pain up to 9/10 on Friday but that resolved. Don't know why, I didn't do anything different    Pertinent History LT thumb arthroplasty 05/12/20. PMH: OA, prior Lt thumb CMC joint capsulorraphy    Limitations per protocol    Currently in Pain? Yes    Pain Score 3     Pain Location --   thumb   Pain Orientation Left    Pain Descriptors / Indicators Aching    Pain Type Acute pain    Pain Onset More than a month ago    Pain Frequency Intermittent    Aggravating Factors  unknown    Pain Relieving Factors heat, alternating hot/cold, elevation           Began light  strengthening HEP today as pt is 8 weeks post-op. Pt issued yellow putty for grip and pinch strength and rubber band for light resistive thumb opposition.  Pt demo each.  Also focused on functional tasks and encouraged pt to begin carrying light weight objects 1-2 lbs (cup 1/2 full of water, light plate, etc) and gradually increase to full cup of water over next 2 weeks. Pt practiced carrying and pouring 1/2 cup of water today in clinic and carrying plastic plate w/ 1/2 lb weight.  Paraffin x 10 min. To decrease stiffness (fluidotherapy unavailable today)                      OT Education - 07/07/20 1306    Education Details Gentle strengthening HEP for hand and thumb    Person(s) Educated Patient    Methods Explanation;Demonstration;Verbal cues;Handout    Comprehension Verbalized understanding;Returned demonstration            OT Short Term Goals - 07/07/20 1426      OT SHORT TERM GOAL #1   Title Independent with splint wear and care    Time 2    Period Weeks    Status  Achieved      OT SHORT TERM GOAL #2   Title Independent with initial HEP    Time 4    Period Weeks    Status Achieved      OT SHORT TERM GOAL #3   Title Independent w/ scar massage and desensitization techniques    Time 4    Period Weeks    Status Achieved      OT SHORT TERM GOAL #4   Title Pt to demo thumb opposition to 5th digit    Baseline to 4th digit    Time 4    Period Weeks    Status Achieved      OT SHORT TERM GOAL #5   Title Pt to use Lt hand to assist w/ BADLS    Time 4    Period Weeks    Status On-going             OT Long Term Goals - 06/30/20 1327      OT LONG TERM GOAL #1   Title Independent with updated strengthening HEP    Time 8    Period Weeks    Status On-going      OT LONG TERM GOAL #2   Title Lt thumb ROM to be WFL's for necessary tasks    Time 8    Period Weeks    Status On-going      OT LONG TERM GOAL #3   Title Grip strength to be 30 lbs or  greater Lt hand to assist with opening tight jars/containers    Time 8    Period Weeks    Status On-going      OT LONG TERM GOAL #4   Title Pain to be less than or equal to 3/10 with all functional tasks    Time 8    Period Weeks    Status On-going      OT LONG TERM GOAL #5   Title Pt to return to all IADLS at mod I level    Time 8    Period Weeks    Status On-going                 Plan - 07/07/20 1426    Clinical Impression Statement Pt progressing nicely toward her goals.  Patient progressed to light strengthening today per protocol as she is 8 weeks post-op as of today.    OT Occupational Profile and History Problem Focused Assessment - Including review of records relating to presenting problem    Occupational performance deficits (Please refer to evaluation for details): ADL's;IADL's;Leisure    Body Structure / Function / Physical Skills ADL;Strength;Dexterity;Pain;UE functional use;Edema;IADL;ROM;Scar mobility;Sensation;Coordination;FMC    Rehab Potential Good    Clinical Decision Making Several treatment options, min-mod task modification necessary    Comorbidities Affecting Occupational Performance: May have comorbidities impacting occupational performance    Modification or Assistance to Complete Evaluation  No modification of tasks or assist necessary to complete eval    OT Frequency 1x / week    OT Duration 8 weeks    OT Treatment/Interventions Self-care/ADL training;Moist Heat;Fluidtherapy;DME and/or AE instruction;Splinting;Contrast Bath;Compression bandaging;Therapeutic activities;Therapeutic exercise;Ultrasound;Scar mobilization;Cryotherapy;Passive range of motion;Electrical Stimulation;Paraffin;Manual Therapy;Patient/family education    Plan continue weaning from brace, continue fluidotherapy, assess pinch and grip strength, continue functional tasks and light strengthening    Consulted and Agree with Plan of Care Patient           Patient will benefit from  skilled therapeutic intervention in order to  improve the following deficits and impairments:   Body Structure / Function / Physical Skills: ADL,Strength,Dexterity,Pain,UE functional use,Edema,IADL,ROM,Scar mobility,Sensation,Coordination,FMC       Visit Diagnosis: Stiffness of left hand, not elsewhere classified  Pain in thumb joint with movement of left hand  Muscle weakness (generalized)  Other lack of coordination    Problem List Patient Active Problem List   Diagnosis Date Noted  . Gout 02/05/2020  . Secondary hypertension 02/05/2020  . Vitamin B deficiency 02/05/2020  . Hypothyroidism 02/05/2020  . Anxiety 02/05/2020  . Depression 02/05/2020  . Hand pain, left 02/05/2020  . Family history of malignant melanoma 02/05/2020  . Elevated hemoglobin A1c 01/02/2019  . Encounter for vitamin deficiency screening 01/02/2019  . Low vitamin B12 level 01/02/2019  . Primary osteoarthritis of first carpometacarpal joint of left hand 09/25/2018  . GAD (generalized anxiety disorder) 04/10/2018  . Hyperlipidemia 04/10/2018  . Carpal tunnel syndrome of left wrist 08/17/2017    Carey Bullocks, OTR/L 07/07/2020, 2:28 PM  [facility] [address] [place], [place], [address] Phone: [phone]   Fax:  [phone]  Name: Danielle Jones MRN: [id number] Date of Birth: 1961-01-16

## 2020-07-07 NOTE — Patient Instructions (Signed)
   1. Grip Strengthening (Resistive Putty)   Squeeze putty using thumb and all fingers. Repeat _20___ times. Do __2__ sessions per day.   2. Roll putty into tube on table and pinch between thumb and first two fingers x 10 reps each. Do 2 sessions per day.   3. Hold rubber band with other hand and give little resistance as thumb touches tip of small finger x 10 reps. Do 2 sessions per day.     Copyright  [employer]. All rights reserved.

## 2020-07-09 ENCOUNTER — Encounter: Payer: Self-pay | Admitting: Orthopaedic Surgery

## 2020-07-09 ENCOUNTER — Other Ambulatory Visit: Payer: Self-pay

## 2020-07-09 ENCOUNTER — Ambulatory Visit (INDEPENDENT_AMBULATORY_CARE_PROVIDER_SITE_OTHER): Payer: 59 | Admitting: Physician Assistant

## 2020-07-09 DIAGNOSIS — M1812 Unilateral primary osteoarthritis of first carpometacarpal joint, left hand: Secondary | ICD-10-CM

## 2020-07-09 MED ORDER — HYDROCODONE-ACETAMINOPHEN 5-325 MG PO TABS: 1.0000 | ORAL_TABLET | Freq: Three times a day (TID) | ORAL | 0 refills | Status: DC | PRN

## 2020-07-09 NOTE — Progress Notes (Addendum)
   Post-Op Visit Note   Patient: Danielle Jones           Date of Birth: 01/28/1961           MRN: [id number] Visit Date: 07/09/2020 PCP: Rip Harbour, NP   Assessment & Plan:  Chief Complaint:  Chief Complaint  Patient presents with   Left Thumb - Routine Post Op   Visit Diagnoses:  1. Primary osteoarthritis of first carpometacarpal joint of left hand     Plan: Patient is a pleasant 60 year old female who comes in today 8 weeks out status post left thumb CMC arthroplasty, 05/12/20.  She has been doing well.  She still has minor discomfort but this is really when she has been working with physical therapy.  She is regained a fair amount of range of motion.  She has been wearing a thermoplastic splint at night and a soft splint most times during the day.  Examination of her left hand reveals full opposition to the index, long, ring and small fingers.  She has increased pain with flexion.  She is neurovascular intact distally.  At this point, she will continue with hand therapy.  She will continue wearing her thermoplastic splint at night as well as with any lifting.  She will follow-up with [place] in 4 weeks time for recheck.  Call with concerns or questions in the meantime.  Follow-Up Instructions: Return in about 4 weeks (around 08/06/2020).   Orders:  No orders of the defined types were placed in this encounter.  No orders of the defined types were placed in this encounter.   Imaging: No new imaging  PMFS History: Patient Active Problem List   Diagnosis Date Noted   Gout 02/05/2020   Secondary hypertension 02/05/2020   Vitamin B deficiency 02/05/2020   Hypothyroidism 02/05/2020   Anxiety 02/05/2020   Depression 02/05/2020   Hand pain, left 02/05/2020   Family history of malignant melanoma 02/05/2020   Elevated hemoglobin A1c 01/02/2019   Encounter for vitamin deficiency screening 01/02/2019   Low vitamin B12 level 01/02/2019   Primary osteoarthritis of first  carpometacarpal joint of left hand 09/25/2018   GAD (generalized anxiety disorder) 04/10/2018   Hyperlipidemia 04/10/2018   Carpal tunnel syndrome of left wrist 08/17/2017   Past Medical History:  Diagnosis Date   Anxiety    CMC arthritis    left thumb   Depression    Gout    High cholesterol    Hypertension    Hypothyroidism     Family History  Problem Relation Age of Onset   Arthritis Mother    Alzheimer's disease Mother    Kidney failure Mother    Congenital heart disease Father    Melanoma Father     Past Surgical History:  Procedure Laterality Date   ABDOMINAL HYSTERECTOMY     Carpal tunnel  2019   CARPOMETACARPEL SUSPENSION PLASTY Left 05/12/2020   Procedure: LEFT THUMB CARPOMETACARPAL ARTHROPLASTY;  Surgeon: Leandrew Koyanagi, MD;  Location: [facility];  Service: Orthopedics;  Laterality: Left;   thumb release  2019   Social History   Occupational History   Not on file  Tobacco Use   Smoking status: Never   Smokeless tobacco: Never  Substance and Sexual Activity   Alcohol use: Yes    Comment: social   Drug use: No   Sexual activity: Not on file

## 2020-07-14 ENCOUNTER — Ambulatory Visit: Payer: 59 | Admitting: Occupational Therapy

## 2020-07-15 ENCOUNTER — Encounter: Payer: 59 | Admitting: Occupational Therapy

## 2020-07-21 ENCOUNTER — Ambulatory Visit: Payer: 59 | Admitting: *Deleted

## 2020-07-28 ENCOUNTER — Encounter: Payer: 59 | Admitting: Occupational Therapy

## 2020-08-06 ENCOUNTER — Telehealth: Payer: Self-pay | Admitting: Orthopaedic Surgery

## 2020-08-06 ENCOUNTER — Ambulatory Visit (INDEPENDENT_AMBULATORY_CARE_PROVIDER_SITE_OTHER): Payer: 59 | Admitting: Orthopaedic Surgery

## 2020-08-06 ENCOUNTER — Encounter: Payer: Self-pay | Admitting: Orthopaedic Surgery

## 2020-08-06 DIAGNOSIS — M1812 Unilateral primary osteoarthritis of first carpometacarpal joint, left hand: Secondary | ICD-10-CM

## 2020-08-06 MED ORDER — TRAMADOL HCL 50 MG PO TABS: 50.0000 mg | ORAL_TABLET | Freq: Every day | ORAL | 0 refills | Status: DC | PRN

## 2020-08-06 NOTE — Progress Notes (Signed)
Post-Op Visit Note   Patient: Danielle Jones           Date of Birth: [birth year]           MRN: [id number] Visit Date: 08/06/2020 PCP: Danielle Harbour, NP   Assessment & Plan:  Chief Complaint:  Chief Complaint  Patient presents with   Left Thumb - Routine Post Op, Follow-up   Visit Diagnoses:  1. Primary osteoarthritis of first carpometacarpal joint of left hand     Plan: [name] is 63-month status post left thumb LRTI.  She has been doing well.  Reports swelling at the base of the thumb as well as soreness into the hand.  She still has some numbness to the dorsal aspect of the first webspace.  Left hand surgical scars are all fully healed.  She has very good palmar abduction and regular abduction.  Lacks about a centimeter and a half of thumb opposition to the fifth metacarpal head..  She is nontender to the surgical areas.  She does exhibit some mild swelling to the base of the thumb.  No signs of infection.  No neurovascular compromise.  She will continue with home exercises to help strengthen her hand and regain hand function.  She will use ice and compression to the swollen area.  I would expect this to continue to improve as she continues to heal from the surgery.  We will recheck her in 2 months.  Prescription for tramadol for breakthrough pain.  Follow-Up Instructions: Return in about 2 months (around 10/07/2020).   Orders:  No orders of the defined types were placed in this encounter.  Meds ordered this encounter  Medications   traMADol (ULTRAM) 50 MG tablet    Sig: Take 1-2 tablets (50-100 mg total) by mouth daily as needed.    Dispense:  20 tablet    Refill:  0    Imaging: No results found.  PMFS History: Patient Active Problem List   Diagnosis Date Noted   Gout 02/05/2020   Secondary hypertension 02/05/2020   Vitamin B deficiency 02/05/2020   Hypothyroidism 02/05/2020   Anxiety 02/05/2020   Depression 02/05/2020   Hand pain, left 02/05/2020   Family  history of malignant melanoma 02/05/2020   Elevated hemoglobin A1c 01/02/2019   Encounter for vitamin deficiency screening 01/02/2019   Low vitamin B12 level 01/02/2019   Primary osteoarthritis of first carpometacarpal joint of left hand 09/25/2018   GAD (generalized anxiety disorder) 04/10/2018   Hyperlipidemia 04/10/2018   Carpal tunnel syndrome of left wrist 08/17/2017   Past Medical History:  Diagnosis Date   Anxiety    CMC arthritis    left thumb   Depression    Gout    High cholesterol    Hypertension    Hypothyroidism     Family History  Problem Relation Age of Onset   Arthritis Mother    Alzheimer's disease Mother    Kidney failure Mother    Congenital heart disease Father    Melanoma Father     Past Surgical History:  Procedure Laterality Date   ABDOMINAL HYSTERECTOMY     Carpal tunnel  2019   CARPOMETACARPEL SUSPENSION PLASTY Left 05/12/2020   Procedure: LEFT THUMB CARPOMETACARPAL ARTHROPLASTY;  Surgeon: Leandrew Koyanagi, MD;  Location: [facility];  Service: Orthopedics;  Laterality: Left;   thumb release  2019   Social History   Occupational History   Not on file  Tobacco Use   Smoking status: Never  Smokeless tobacco: Never  Substance and Sexual Activity   Alcohol use: Yes    Comment: social   Drug use: No   Sexual activity: Not on file

## 2020-08-06 NOTE — Telephone Encounter (Signed)
Received $25.00 cash,medical records release form and Long term disability paperwork     Forwarding to [facility] today

## 2020-08-11 ENCOUNTER — Telehealth: Payer: Self-pay

## 2020-08-11 ENCOUNTER — Telehealth: Payer: Self-pay | Admitting: Orthopaedic Surgery

## 2020-08-11 NOTE — Telephone Encounter (Signed)
Called patient no answer LMOM. See messages below.        Leandrew Koyanagi, MD  Precious Bard, RMA What kind of work does she do?  Is light duty available or possible?       Previous Messages    ----- Message -----  From: Precious Bard, RMA  Sent: 08/09/2020  11:22 AM EDT  To: Leandrew Koyanagi, MD  Subject: FW: STATUS                                      ----- Message -----  From: Daryl Eastern  Sent: 08/09/2020  11:04 AM EDT  To: Precious Bard, RMA  Subject: STATUS                                         Please advise if pt is still oow. I currently have forms to complete for her and I need more information on her status with work and restrictions if any. Ifshe is still oow can you please provide a note for her forms. Thanks

## 2020-08-11 NOTE — Telephone Encounter (Signed)
Patient called. Says she is still out of work and there is not light duty available. 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

## 2020-08-11 NOTE — Telephone Encounter (Signed)
See other msg

## 2020-08-12 NOTE — Telephone Encounter (Signed)
Out of work 1 month

## 2020-08-12 NOTE — Telephone Encounter (Signed)
Note made.  

## 2020-08-26 ENCOUNTER — Other Ambulatory Visit: Payer: Self-pay

## 2020-08-26 ENCOUNTER — Ambulatory Visit (INDEPENDENT_AMBULATORY_CARE_PROVIDER_SITE_OTHER): Payer: 59 | Admitting: Physician Assistant

## 2020-08-26 ENCOUNTER — Encounter: Payer: Self-pay | Admitting: Physician Assistant

## 2020-08-26 DIAGNOSIS — D2271 Melanocytic nevi of right lower limb, including hip: Secondary | ICD-10-CM

## 2020-08-26 DIAGNOSIS — T07XXXA Unspecified multiple injuries, initial encounter: Secondary | ICD-10-CM | POA: Diagnosis not present

## 2020-08-26 DIAGNOSIS — L720 Epidermal cyst: Secondary | ICD-10-CM

## 2020-08-26 DIAGNOSIS — W57XXXA Bitten or stung by nonvenomous insect and other nonvenomous arthropods, initial encounter: Secondary | ICD-10-CM | POA: Diagnosis not present

## 2020-08-26 DIAGNOSIS — D485 Neoplasm of uncertain behavior of skin: Secondary | ICD-10-CM

## 2020-08-26 MED ORDER — CLOBETASOL PROPIONATE 0.05 % EX CREA: 1.0000 "application " | TOPICAL_CREAM | Freq: Two times a day (BID) | CUTANEOUS | 2 refills | Status: DC

## 2020-08-26 NOTE — Progress Notes (Signed)
   New Patient   Subjective  Danielle Jones is a 60 y.o. female who presents for the following: New Patient (Initial Visit) (Patient here today for skin check. Per patient her PCP wanted a lesion on her lower back checked x years it does itch. Per patient she has several lesions she would like checked on her left shoulder, under left breast, lower left leg and right ankle x years no bleeding. No personal history of atypical moles, melanoma or non mole skin cancer. Per patient her father did have melanoma.).   The following portions of the chart were reviewed this encounter and updated as appropriate:  Tobacco  Allergies  Meds  Problems  Med Hx  Surg Hx  Fam Hx      Objective  Well appearing patient in no apparent distress; mood and affect are within normal limits.  A full examination was performed including scalp, head, eyes, ears, nose, lips, neck, chest, axillae, abdomen, back, buttocks, bilateral upper extremities, bilateral lower extremities, hands, feet, fingers, toes, fingernails, and toenails. All findings within normal limits unless otherwise noted below.  Right Thigh - Anterior Bichromic dark nested macule.      Mid Back, arms, legs and abdomen Numerous pink whelp back and legs  Left Upper Back Large open comedome with surrounding deep nodule.   Assessment & Plan  Neoplasm of uncertain behavior of skin Right Thigh - Anterior  Skin / nail biopsy Type of biopsy: tangential   Informed consent: discussed and consent obtained   Timeout: patient name, date of birth, surgical site, and procedure verified   Anesthesia: the lesion was anesthetized in a standard fashion   Anesthetic:  1% lidocaine w/ epinephrine 1-100,000 local infiltration Instrument used: flexible razor blade   Hemostasis achieved with: ferric subsulfate   Outcome: patient tolerated procedure well   Post-procedure details: wound care instructions given    Specimen 1 - Surgical pathology Differential  Diagnosis: atypia  Check Margins: yes  Bug bite, initial encounter Mid Back, arms, legs and abdomen  clobetasol cream (TEMOVATE) 0.05 % - Mid Back, arms, legs and abdomen Apply 1 application topically 2 (two) times daily.  Epidermal cyst Left Upper Back  Patient will schedule 30 min surgery appointment.      I, Kathie Posa, PA-C, have reviewed all documentation's for this visit.  The documentation on 08/26/20 for the exam, diagnosis, procedures and orders are all accurate and complete.

## 2020-08-26 NOTE — Patient Instructions (Signed)

## 2020-09-02 ENCOUNTER — Telehealth: Payer: Self-pay

## 2020-09-02 NOTE — Telephone Encounter (Signed)
Phone call to patient with her pathology results. Patient aware of results.  

## 2020-09-02 NOTE — Telephone Encounter (Signed)
-----   Message from Warren Danes, [place] sent at 09/01/2020  3:38 PM EDT ----- excise

## 2020-09-14 NOTE — Progress Notes (Signed)
Subjective:  Patient ID: Danielle Jones, female    DOB: Sep 16, 1960  Age: 60 y.o. MRN: [id number]  Chief Complaint  Patient presents with   Hypertension   Hyperlipidemia   Hypothyroidism   HPI: Danielle Jones is a 60 year old Caucasian female that presents for follow-up of hypertension, hyperlipidemia, and hypothyroidism. Danielle Jones tells me that she had a skin lesion removed from right anterior thigh by dermatology 08/26/20. States she is scheduled to follow-up with dermatology in Oct 2022 to have further excision of skin lesion borders due to atypia. Danielle Jones also had had left hand surgery with Dr Erlinda Hong on 05/12/20. States she attended PT afterward. 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  Danielle Jones tells me that has routine dental exams bi-weekly. Mammogram normal 01/22. Last colonoscopy 2013 with Dr Lyndel Safe. She is past due for screening eye exam. Danielle Jones is interested in shingles vaccination; states she will verify medical insurance coverage of immunization before proceeding.   Hypertension, follow-up  She was last seen for hypertension 6 months ago.  BP at that visit was 138/68. Management since that visit includes Tribenzor-states she did not take medication this morning.  She reports good compliance with treatment. She is not having side effects.  She is following a Regular diet. She is not exercising. She does not smoke.  Use of agents associated with hypertension: NSAIDS.   Outside blood pressures are not being checked. Symptoms: No chest pain No chest pressure  No palpitations No syncope  No dyspnea No orthopnea  No paroxysmal nocturnal dyspnea No lower extremity edema   Pertinent labs: Lab Results  Component Value Date   CHOL 155 03/18/2020   HDL 42 03/18/2020   LDLCALC 87 03/18/2020   TRIG 146 03/18/2020   CHOLHDL 3.7 03/18/2020   Lab Results  Component Value Date   NA 138 05/10/2020   K 3.8  05/10/2020   CREATININE 0.94 05/10/2020   GFRNONAA >60 05/10/2020   GFRAA 68 02/05/2020   GLUCOSE 98 05/10/2020     The 10-year ASCVD risk score Mikey Bussing DC Jr., et al., 2013) is: 5.4%    Lipid/Cholesterol, Follow-up  Last lipid panel Other pertinent labs  Lab Results  Component Value Date   CHOL 155 03/18/2020   HDL 42 03/18/2020   LDLCALC 87 03/18/2020   TRIG 146 03/18/2020   CHOLHDL 3.7 03/18/2020   Lab Results  Component Value Date   ALT 19 02/05/2020   AST 16 02/05/2020   PLT 298 02/05/2020   TSH 1.270 02/05/2020     She was last seen for this 6 months ago.  Management since that visit includes Lipitor 40 mg daily.  She reports good compliance with treatment. She is not having side effects.   Symptoms: No chest pain No chest pressure/discomfort  No dyspnea No lower extremity edema  No numbness or tingling of extremity No orthopnea  No palpitations No paroxysmal nocturnal dyspnea  No speech difficulty No syncope   Current diet: well balanced Current exercise: housecleaning  The 10-year ASCVD risk score Mikey Bussing DC Jr., et al., 2013) is: 5.4%    Hypothyroidism - Medications: Levothyroxine 50 mcg daily  - Current symptoms:  none - Denies palpitations - Symptoms have been well-controlled     Current Outpatient Medications on File Prior to Visit  Medication Sig Dispense Refill   allopurinol (ZYLOPRIM) 100 MG tablet 2 po qd 180 tablet 1  atorvastatin (LIPITOR) 40 MG tablet Take 1 tablet by mouth daily.     ergocalciferol (VITAMIN D2) 1.25 MG (50000 UT) capsule TAKE 1 CAPSULE BY MOUTH 1 TIME A WEEK FOR 12 DOSES     levothyroxine (SYNTHROID) 50 MCG tablet TAKE 1 TABLET(50 MCG) BY MOUTH DAILY     meloxicam (MOBIC) 15 MG tablet TAKE 1/2 TABLET(7.5 MG) BY MOUTH TWICE DAILY AS NEEDED FOR PAIN 90 tablet 1   Olmesartan-amLODIPine-HCTZ 40-10-25 MG TABS TAKE ONE (1) TABLET BY MOUTH EVERY DAY 90 tablet 1   venlafaxine XR (EFFEXOR-XR) 150 MG 24 hr capsule Take 150 mg by  mouth daily with breakfast.     No current facility-administered medications on file prior to visit.   Past Medical History:  Diagnosis Date   Anxiety    CMC arthritis    left thumb   Depression    Gout    High cholesterol    Hypertension    Hypothyroidism    Past Surgical History:  Procedure Laterality Date   ABDOMINAL HYSTERECTOMY     Carpal tunnel  2019   CARPOMETACARPEL SUSPENSION PLASTY Left 05/12/2020   Procedure: LEFT THUMB CARPOMETACARPAL ARTHROPLASTY;  Surgeon: Leandrew Koyanagi, MD;  Location: [facility];  Service: Orthopedics;  Laterality: Left;   thumb release  2019    Family History  Problem Relation Age of Onset   Arthritis Mother    Alzheimer's disease Mother    Kidney failure Mother    Congenital heart disease Father    Melanoma Father    Social History   Socioeconomic History   Marital status: Single    Spouse name: Not on file   Number of children: Not on file   Years of education: Not on file   Highest education level: Not on file  Occupational History   Not on file  Tobacco Use   Smoking status: Never   Smokeless tobacco: Never  Substance and Sexual Activity   Alcohol use: Yes    Comment: social   Drug use: No   Sexual activity: Not on file  Other Topics Concern   Not on file  Social History Narrative   Not on file   Social Determinants of Health   Financial Resource Strain: Not on file  Food Insecurity: Not on file  Transportation Needs: Not on file  Physical Activity: Not on file  Stress: Not on file  Social Connections: Not on file    Review of Systems  Constitutional:  Negative for appetite change, fatigue and fever.  HENT:  Negative for congestion, ear pain, sinus pressure and sore throat.   Eyes:  Negative for pain.  Respiratory:  Negative for cough, chest tightness, shortness of breath and wheezing.   Cardiovascular:  Negative for chest pain and palpitations.  Gastrointestinal:  Negative for abdominal pain,  constipation, diarrhea, nausea and vomiting.  Endocrine: Negative.   Genitourinary:  Negative for dysuria and hematuria.  Musculoskeletal:  Positive for arthralgias (left hand) and joint swelling (left hand). Negative for back pain and myalgias.  Skin:  Negative for rash.  Allergic/Immunologic: Negative.   Neurological:  Negative for dizziness, weakness and headaches.  Hematological: Negative.   Psychiatric/Behavioral:  Negative for dysphoric mood. The patient is not nervous/anxious.     Objective:  BP (!) 158/78 (BP Location: Left Arm, Patient Position: Sitting)   Pulse 76   Temp 97.9 F (36.6 C) (Temporal)   Ht '5\' 3"'$  (1.6 m)   Wt 233 lb (105.7 kg)  SpO2 98%   BMI 41.27 kg/m   BP/Weight 09/15/2020 05/28/2020 [id number]  Systolic BP 0000000 - [id number]  Diastolic BP 78 - 59  Wt. (Lbs) 233 223 223.77  BMI 41.27 39.5 39.64    Physical Exam Vitals reviewed.  Constitutional:      Appearance: Normal appearance.  HENT:     Right Ear: Tympanic membrane, ear canal and external ear normal.     Left Ear: Tympanic membrane, ear canal and external ear normal.     Nose: Nose normal.     Mouth/Throat:     Mouth: Mucous membranes are moist.  Cardiovascular:     Rate and Rhythm: Normal rate and regular rhythm.     Pulses: Normal pulses.     Heart sounds: Normal heart sounds.  Pulmonary:     Effort: Pulmonary effort is normal.     Breath sounds: Normal breath sounds.  Abdominal:     Palpations: Abdomen is soft.  Musculoskeletal:        General: Normal range of motion.     Cervical back: Normal range of motion.  Skin:    General: Skin is warm and dry.  Neurological:     Mental Status: She is alert and oriented to person, place, and time.  Psychiatric:        Mood and Affect: Mood normal.        Behavior: Behavior normal.        Thought Content: Thought content normal.        Judgment: Judgment normal.      Lab Results  Component Value Date   WBC 6.7 02/05/2020   HGB 12.4  02/05/2020   HCT 37.9 02/05/2020   PLT 298 02/05/2020   GLUCOSE 98 05/10/2020   CHOL 155 03/18/2020   TRIG 146 03/18/2020   HDL 42 03/18/2020   LDLCALC 87 03/18/2020   ALT 19 02/05/2020   AST 16 02/05/2020   NA 138 05/10/2020   K 3.8 05/10/2020   CL 99 05/10/2020   CREATININE 0.94 05/10/2020   BUN 11 05/10/2020   CO2 31 05/10/2020   TSH 1.270 02/05/2020      Assessment & Plan:    1. Primary hypertension - CBC With Diff/Platelet - Comprehensive metabolic panel  2. Mixed hyperlipidemia - Lipid panel  3. Hypothyroidism, unspecified type - TSH  4. BMI 40.0-44.9, adult (HCC)  -heart healthy diet -increase physical activity  Return tomorrow for BP check (nurse visit) Recommend screening eye exam Recommend shingles vaccine (check on insurance coverage) Continue medications as prescribed We will call you with lab results Follow-up in 37-month     Follow-up: 343-month(nurse visit tomorrow for BP check, possible Shingles vaccine)  An After Visit Summary was printed and given to the patient.   I, ShRip HarbourNP, have reviewed all documentation for this visit. The documentation on 09/15/20 for the exam, diagnosis, procedures, and orders are all accurate and complete.    I,Lauren M Auman,acting as a scEducation administratoror ShCIT GroupNP.,have documented all relevant documentation on the behalf of ShRip HarbourNP,as directed by  ShRip HarbourNP while in the presence of ShRip HarbourNP.    ShRip HarbourNP [phone]

## 2020-09-15 ENCOUNTER — Encounter: Payer: Self-pay | Admitting: Nurse Practitioner

## 2020-09-15 ENCOUNTER — Other Ambulatory Visit: Payer: Self-pay

## 2020-09-15 ENCOUNTER — Ambulatory Visit (INDEPENDENT_AMBULATORY_CARE_PROVIDER_SITE_OTHER): Payer: 59 | Admitting: Nurse Practitioner

## 2020-09-15 VITALS — BP 158/78 | HR 76 | Temp 97.9°F | Ht 63.0 in | Wt 233.0 lb

## 2020-09-15 DIAGNOSIS — I10 Essential (primary) hypertension: Secondary | ICD-10-CM

## 2020-09-15 DIAGNOSIS — Z6841 Body Mass Index (BMI) 40.0 and over, adult: Secondary | ICD-10-CM | POA: Diagnosis not present

## 2020-09-15 DIAGNOSIS — E782 Mixed hyperlipidemia: Secondary | ICD-10-CM | POA: Diagnosis not present

## 2020-09-15 DIAGNOSIS — E039 Hypothyroidism, unspecified: Secondary | ICD-10-CM

## 2020-09-15 NOTE — Patient Instructions (Addendum)
Return tomorrow for BP check (nurse visit) Recommend screening eye exam Recommend shingles vaccine (check on insurance coverage) Continue medications as prescribed We will call you with lab results Follow-up in 31-month   Calorie Counting for Weight Loss Calories are units of energy. Your body needs a certain number of calories from food to keep going throughout the day. When you eat or drink more calories than your body needs, your body stores the extra calories mostly as fat. When you eat or drink fewer calories than your body needs, your body burns fat to getthe energy it needs. Calorie counting means keeping track of how many calories you eat and drink each day. Calorie counting can be helpful if you need to lose weight. If you eat fewer calories than your body needs, you should lose weight. Ask yourhealth care provider what a healthy weight is for you. For calorie counting to work, you will need to eat the right number of calories each day to lose a healthy amount of weight per week. A dietitian can help you figure out how many calories you need in a day and will suggest ways to reach your calorie goal. A healthy amount of weight to lose each week is usually 1-2 lb (0.5-0.9 kg). This usually means that your daily calorie intake should be reduced by 500-750 calories. Eating 1,200-1,500 calories a day can help most women lose weight. Eating 1,500-1,800 calories a day can help most men lose weight. What do I need to know about calorie counting? Work with your health care provider or dietitian to determine how many calories you should get each day. To meet your daily calorie goal, you will need to: Find out how many calories are in each food that you would like to eat. Try to do this before you eat. Decide how much of the food you plan to eat. Keep a food log. Do this by writing down what you ate and how many calories it had. To successfully lose weight, it is important to balance calorie  counting with ahealthy lifestyle that includes regular activity. Where do I find calorie information?  The number of calories in a food can be found on a Nutrition Facts label. If a food does not have a Nutrition Facts label, try to look up the calories onlineor ask your dietitian for help. Remember that calories are listed per serving. If you choose to have more than one serving of a food, you will have to multiply the calories per serving by the number of servings you plan to eat. For example, the label on a package of bread might say that a serving size is 1 slice and that there are 90 calories in a serving. If you eat 1 slice, you will have eaten 90 calories. If you eat 2slices, you will have eaten 180 calories. How do I keep a food log? After each time that you eat, record the following in your food log as soon as possible: What you ate. Be sure to include toppings, sauces, and other extras on the food. How much you ate. This can be measured in cups, ounces, or number of items. How many calories were in each food and drink. The total number of calories in the food you ate. Keep your food log near you, such as in a pocket-sized notebook or on an app or website on your mobile phone. Some programs will calculate calories for you andshow you how many calories you have left to meet your daily  goal. What are some portion-control tips? Know how many calories are in a serving. This will help you know how many servings you can have of a certain food. Use a measuring cup to measure serving sizes. You could also try weighing out portions on a kitchen scale. With time, you will be able to estimate serving sizes for some foods. Take time to put servings of different foods on your favorite plates or in your favorite bowls and cups so you know what a serving looks like. Try not to eat straight from a food's packaging, such as from a bag or box. Eating straight from the package makes it hard to see how much you  are eating and can lead to overeating. Put the amount you would like to eat in a cup or on a plate to make sure you are eating the right portion. Use smaller plates, glasses, and bowls for smaller portions and to prevent overeating. Try not to multitask. For example, avoid watching TV or using your computer while eating. If it is time to eat, sit down at a table and enjoy your food. This will help you recognize when you are full. It will also help you be more mindful of what and how much you are eating. What are tips for following this plan? Reading food labels Check the calorie count compared with the serving size. The serving size may be smaller than what you are used to eating. Check the source of the calories. Try to choose foods that are high in protein, fiber, and vitamins, and low in saturated fat, trans fat, and sodium. Shopping Read nutrition labels while you shop. This will help you make healthy decisions about which foods to buy. Pay attention to nutrition labels for low-fat or fat-free foods. These foods sometimes have the same number of calories or more calories than the full-fat versions. They also often have added sugar, starch, or salt to make up for flavor that was removed with the fat. Make a grocery list of lower-calorie foods and stick to it. Cooking Try to cook your favorite foods in a healthier way. For example, try baking instead of frying. Use low-fat dairy products. Meal planning Use more fruits and vegetables. One-half of your plate should be fruits and vegetables. Include lean proteins, such as chicken, [place], and fish. Lifestyle Each week, aim to do one of the following: 150 minutes of moderate exercise, such as walking. 75 minutes of vigorous exercise, such as running. General information Know how many calories are in the foods you eat most often. This will help you calculate calorie counts faster. Find a way of tracking calories that works for you. Get creative.  Try different apps or programs if writing down calories does not work for you. What foods should I eat?  Eat nutritious foods. It is better to have a nutritious, high-calorie food, such as an avocado, than a food with few nutrients, such as a bag of potato chips. Use your calories on foods and drinks that will fill you up and will not leave you hungry soon after eating. Examples of foods that fill you up are nuts and nut butters, vegetables, lean proteins, and high-fiber foods such as whole grains. High-fiber foods are foods with more than 5 g of fiber per serving. Pay attention to calories in drinks. Low-calorie drinks include water and unsweetened drinks. The items listed above may not be a complete list of foods and beverages you can eat. Contact a dietitian for more  information. What foods should I limit? Limit foods or drinks that are not good sources of vitamins, minerals, or protein or that are high in unhealthy fats. These include: Candy. Other sweets. Sodas, specialty coffee drinks, alcohol, and juice. The items listed above may not be a complete list of foods and beverages you should avoid. Contact a dietitian for more information. How do I count calories when eating out? Pay attention to portions. Often, portions are much larger when eating out. Try these tips to keep portions smaller: Consider sharing a meal instead of getting your own. If you get your own meal, eat only half of it. Before you start eating, ask for a container and put half of your meal into it. When available, consider ordering smaller portions from the menu instead of full portions. Pay attention to your food and drink choices. Knowing the way food is cooked and what is included with the meal can help you eat fewer calories. If calories are listed on the menu, choose the lower-calorie options. Choose dishes that include vegetables, fruits, whole grains, low-fat dairy products, and lean proteins. Choose items that  are boiled, broiled, grilled, or steamed. Avoid items that are buttered, battered, fried, or served with cream sauce. Items labeled as crispy are usually fried, unless stated otherwise. Choose water, low-fat milk, unsweetened iced tea, or other drinks without added sugar. If you want an alcoholic beverage, choose a lower-calorie option, such as a glass of wine or light beer. Ask for dressings, sauces, and syrups on the side. These are usually high in calories, so you should limit the amount you eat. If you want a salad, choose a garden salad and ask for grilled meats. Avoid extra toppings such as bacon, cheese, or fried items. Ask for the dressing on the side, or ask for olive oil and vinegar or lemon to use as dressing. Estimate how many servings of a food you are given. Knowing serving sizes will help you be aware of how much food you are eating at restaurants. Where to find more information Centers for Disease Control and Prevention: http://www.wolf.info/ U.S. Department of Agriculture: http://www.wilson-mendoza.org/ Summary Calorie counting means keeping track of how many calories you eat and drink each day. If you eat fewer calories than your body needs, you should lose weight. A healthy amount of weight to lose per week is usually 1-2 lb (0.5-0.9 kg). This usually means reducing your daily calorie intake by 500-750 calories. The number of calories in a food can be found on a Nutrition Facts label. If a food does not have a Nutrition Facts label, try to look up the calories online or ask your dietitian for help. Use smaller plates, glasses, and bowls for smaller portions and to prevent overeating. Use your calories on foods and drinks that will fill you up and not leave you hungry shortly after a meal. This information is not intended to replace advice given to you by your health care provider. Make sure you discuss any questions you have with your healthcare provider. Document Revised: 02/27/2019 Document Reviewed:  02/27/2019 Elsevier Patient Education  2022 [place]. Exercising to Lose Weight Exercise is structured, repetitive physical activity to improve fitness and health. Getting regular exercise is important for everyone. It is especially important if you are overweight. Being overweight increases your risk of heart disease, stroke, diabetes, high blood pressure, and several types of cancer.Reducing your calorie intake and exercising can help you lose weight. Exercise is usually categorized as moderate or vigorous  intensity. To lose weight, most people need to do a certain amount of moderate-intensity orvigorous-intensity exercise each week. Moderate-intensity exercise  Moderate-intensity exercise is any activity that gets you moving enough to burn at least three times more energy (calories) than if you were sitting. Examples of moderate exercise include: Walking a mile in 15 minutes. Doing light yard work. Biking at an easy pace. Most people should get at least 150 minutes (2 hours and 30 minutes) a week ofmoderate-intensity exercise to maintain their body weight. Vigorous-intensity exercise Vigorous-intensity exercise is any activity that gets you moving enough to burn at least six times more calories than if you were sitting. When you exercise at this intensity, you should be working hard enough that you are not able tocarry on a conversation. Examples of vigorous exercise include: Running. Playing a team sport, such as football, basketball, and soccer. Jumping rope. Most people should get at least 75 minutes (1 hour and 15 minutes) a week ofvigorous-intensity exercise to maintain their body weight. How can exercise affect me? When you exercise enough to burn more calories than you eat, you lose weight. Exercise also reduces body fat and builds muscle. The more muscle you have, the more calories you burn. Exercise also: Improves mood. Reduces stress and tension. Improves your overall  fitness, flexibility, and endurance. Increases bone strength. The amount of exercise you need to lose weight depends on: Your age. The type of exercise. Any health conditions you have. Your overall physical ability. Talk to your health care provider about how much exercise you need and whattypes of activities are safe for you. What actions can I take to lose weight? Nutrition  Make changes to your diet as told by your health care provider or diet and nutrition specialist (dietitian). This may include: Eating fewer calories. Eating more protein. Eating less unhealthy fats. Eating a diet that includes fresh fruits and vegetables, whole grains, low-fat dairy products, and lean protein. Avoiding foods with added fat, salt, and sugar. Drink plenty of water while you exercise to prevent dehydration or heat stroke.  Activity Choose an activity that you enjoy and set realistic goals. Your health care provider can help you make an exercise plan that works for you. Exercise at a moderate or vigorous intensity most days of the week. The intensity of exercise may vary from person to person. You can tell how intense a workout is for you by paying attention to your breathing and heartbeat. Most people will notice their breathing and heartbeat get faster with more intense exercise. Do resistance training twice each week, such as: Push-ups. Sit-ups. Lifting weights. Using resistance bands. Getting short amounts of exercise can be just as helpful as long structured periods of exercise. If you have trouble finding time to exercise, try to include exercise in your daily routine. Get up, stretch, and walk around every 30 minutes throughout the day. Go for a walk during your lunch break. Park your car farther away from your destination. If you take public transportation, get off one stop early and walk the rest of the way. Make phone calls while standing up and walking around. Take the stairs instead of  elevators or escalators. Wear comfortable clothes and shoes with good support. Do not exercise so much that you hurt yourself, feel dizzy, or get very short of breath. Where to find more information U.S. Department of Health and Human Services: [employer]at Centers for Disease Control and Prevention (CDC): http://www.wolf.info/ Contact a health care provider: Before starting a new  exercise program. If you have questions or concerns about your weight. If you have a medical problem that keeps you from exercising. Get help right away if you have any of the following while exercising: Injury. Dizziness. Difficulty breathing or shortness of breath that does not go away when you stop exercising. Chest pain. Rapid heartbeat. Summary Being overweight increases your risk of heart disease, stroke, diabetes, high blood pressure, and several types of cancer. Losing weight happens when you burn more calories than you eat. Reducing the amount of calories you eat in addition to getting regular moderate or vigorous exercise each week helps you lose weight. This information is not intended to replace advice given to you by your health care provider. Make sure you discuss any questions you have with your healthcare provider. Document Revised: 04/28/2019 Document Reviewed: 05/15/2019 Elsevier Patient Education  2022 [employer].

## 2020-09-16 ENCOUNTER — Ambulatory Visit (INDEPENDENT_AMBULATORY_CARE_PROVIDER_SITE_OTHER): Payer: 59

## 2020-09-16 ENCOUNTER — Other Ambulatory Visit: Payer: Self-pay | Admitting: Nurse Practitioner

## 2020-09-16 VITALS — BP 122/68

## 2020-09-16 DIAGNOSIS — Z23 Encounter for immunization: Secondary | ICD-10-CM | POA: Diagnosis not present

## 2020-09-16 DIAGNOSIS — I10 Essential (primary) hypertension: Secondary | ICD-10-CM | POA: Diagnosis not present

## 2020-09-16 DIAGNOSIS — E782 Mixed hyperlipidemia: Secondary | ICD-10-CM

## 2020-09-16 DIAGNOSIS — Z6841 Body Mass Index (BMI) 40.0 and over, adult: Secondary | ICD-10-CM

## 2020-09-16 LAB — CBC WITH DIFF/PLATELET
Basophils Absolute: 0.1 10*3/uL (ref 0.0–0.2)
Basos: 1 %
EOS (ABSOLUTE): 0.3 10*3/uL (ref 0.0–0.4)
Eos: 4 %
Hematocrit: 37.4 % (ref 34.0–46.6)
Hemoglobin: 12.1 g/dL (ref 11.1–15.9)
Immature Grans (Abs): 0.1 10*3/uL (ref 0.0–0.1)
Immature Granulocytes: 1 %
Lymphocytes Absolute: 2.1 10*3/uL (ref 0.7–3.1)
Lymphs: 29 %
MCH: 28.9 pg (ref 26.6–33.0)
MCHC: 32.4 g/dL (ref 31.5–35.7)
MCV: 90 fL (ref 79–97)
Monocytes Absolute: 0.6 10*3/uL (ref 0.1–0.9)
Monocytes: 8 %
Neutrophils Absolute: 4.1 10*3/uL (ref 1.4–7.0)
Neutrophils: 57 %
Platelets: 312 10*3/uL (ref 150–450)
RBC: 4.18 x10E6/uL (ref 3.77–5.28)
RDW: 13.4 % (ref 11.7–15.4)
WBC: 7.3 10*3/uL (ref 3.4–10.8)

## 2020-09-16 LAB — LIPID PANEL
Chol/HDL Ratio: 4.1 ratio (ref 0.0–4.4)
Cholesterol, Total: 198 mg/dL (ref 100–199)
HDL: 48 mg/dL (ref >39)
LDL Chol Calc (NIH): 123 mg/dL — ABNORMAL HIGH (ref 0–99)
Triglycerides: 152 mg/dL — ABNORMAL HIGH (ref 0–149)
VLDL Cholesterol Cal: 27 mg/dL (ref 5–40)

## 2020-09-16 LAB — COMPREHENSIVE METABOLIC PANEL
ALT: 24 IU/L (ref 0–32)
AST: 18 IU/L (ref 0–40)
Albumin/Globulin Ratio: 2.1 (ref 1.2–2.2)
Albumin: 4.6 g/dL (ref 3.8–4.8)
Alkaline Phosphatase: 133 IU/L — ABNORMAL HIGH (ref 44–121)
BUN/Creatinine Ratio: 15 (ref 12–28)
BUN: 15 mg/dL (ref 8–27)
Bilirubin Total: 0.4 mg/dL (ref 0.0–1.2)
CO2: 23 mmol/L (ref 20–29)
Calcium: 10 mg/dL (ref 8.7–10.3)
Chloride: 98 mmol/L (ref 96–106)
Creatinine, Ser: 0.99 mg/dL (ref 0.57–1.00)
Globulin, Total: 2.2 g/dL (ref 1.5–4.5)
Glucose: 114 mg/dL — ABNORMAL HIGH (ref 65–99)
Potassium: 4.3 mmol/L (ref 3.5–5.2)
Sodium: 143 mmol/L (ref 134–144)
Total Protein: 6.8 g/dL (ref 6.0–8.5)
eGFR: 65 mL/min/{1.73_m2} (ref >59)

## 2020-09-16 LAB — CARDIOVASCULAR RISK ASSESSMENT

## 2020-09-16 LAB — TSH: TSH: 2.47 u[IU]/mL (ref 0.450–4.500)

## 2020-09-16 MED ORDER — PHENTERMINE HCL 37.5 MG PO TABS: 37.5000 mg | ORAL_TABLET | Freq: Every day | ORAL | 0 refills | Status: DC

## 2020-09-21 LAB — HGB A1C W/O EAG: Hgb A1c MFr Bld: 6.1 % — ABNORMAL HIGH (ref 4.8–5.6)

## 2020-09-21 LAB — SPECIMEN STATUS REPORT

## 2020-10-07 ENCOUNTER — Ambulatory Visit (INDEPENDENT_AMBULATORY_CARE_PROVIDER_SITE_OTHER): Payer: 59 | Admitting: Orthopaedic Surgery

## 2020-10-07 ENCOUNTER — Encounter: Payer: Self-pay | Admitting: Orthopaedic Surgery

## 2020-10-07 ENCOUNTER — Other Ambulatory Visit: Payer: Self-pay

## 2020-10-07 DIAGNOSIS — M1812 Unilateral primary osteoarthritis of first carpometacarpal joint, left hand: Secondary | ICD-10-CM

## 2020-10-07 NOTE — Progress Notes (Signed)
Post-Op Visit Note   Patient: Danielle Jones           Date of Birth: 12-05-1960           MRN: [id number] Visit Date: 10/07/2020 PCP: Rip Harbour, NP   Assessment & Plan:  Chief Complaint:  Chief Complaint  Patient presents with   Left Hand - Pain, Follow-up   Left Thumb - Pain, Follow-up   Visit Diagnoses:  1. Primary osteoarthritis of first carpometacarpal joint of left hand     Plan: Danielle Jones is following up today status post left thumb LR TI on 05/12/2020.  She feels that the thumb is not 100% yet.  She does not feel like she has adequate strength or range of motion yet.  She is doing home exercises with putty.  She feels decreased strength when she is lifting things.  Left thumb and hand shows fully healed surgical scar.  She lacks about a centimeter of active opposition of the tip of the thumb to the fifth metacarpal head but passively I can get her there with mild pain.  From my standpoint I feel that she is recovering adequately but I do agree that she still lacks some strength and range of motion but this is also her third surgery that she has had and likely has adhesions and will likely not regain full strength and range of motion.  Overall her hand is quite functional and I think the main thing is just that she needs to work on is strengthening.  I recommend that she follow-up with me in a couple months if she still feels like she is not making any progress otherwise she can follow-up as needed.  Follow-Up Instructions: No follow-ups on file.   Orders:  No orders of the defined types were placed in this encounter.  No orders of the defined types were placed in this encounter.   Imaging: No results found.  PMFS History: Patient Active Problem List   Diagnosis Date Noted   Gout 02/05/2020   Secondary hypertension 02/05/2020   Vitamin B deficiency 02/05/2020   Hypothyroidism 02/05/2020   Anxiety 02/05/2020   Depression 02/05/2020   Hand pain, left 02/05/2020    Family history of malignant melanoma 02/05/2020   Elevated hemoglobin A1c 01/02/2019   Encounter for vitamin deficiency screening 01/02/2019   Low vitamin B12 level 01/02/2019   Primary osteoarthritis of first carpometacarpal joint of left hand 09/25/2018   GAD (generalized anxiety disorder) 04/10/2018   Hyperlipidemia 04/10/2018   Carpal tunnel syndrome of left wrist 08/17/2017   Past Medical History:  Diagnosis Date   Anxiety    CMC arthritis    left thumb   Depression    Gout    High cholesterol    Hypertension    Hypothyroidism     Family History  Problem Relation Age of Onset   Arthritis Mother    Alzheimer's disease Mother    Kidney failure Mother    Congenital heart disease Father    Melanoma Father     Past Surgical History:  Procedure Laterality Date   ABDOMINAL HYSTERECTOMY     Carpal tunnel  2019   CARPOMETACARPEL SUSPENSION PLASTY Left 05/12/2020   Procedure: LEFT THUMB CARPOMETACARPAL ARTHROPLASTY;  Surgeon: Leandrew Koyanagi, MD;  Location: [facility];  Service: Orthopedics;  Laterality: Left;   thumb release  2019   Social History   Occupational History   Not on file  Tobacco Use   Smoking  status: Never   Smokeless tobacco: Never  Substance and Sexual Activity   Alcohol use: Yes    Comment: social   Drug use: No   Sexual activity: Not on file

## 2020-10-08 ENCOUNTER — Other Ambulatory Visit: Payer: Self-pay | Admitting: Nurse Practitioner

## 2020-10-08 DIAGNOSIS — M109 Gout, unspecified: Secondary | ICD-10-CM

## 2020-12-02 ENCOUNTER — Other Ambulatory Visit: Payer: Self-pay

## 2020-12-02 ENCOUNTER — Ambulatory Visit (INDEPENDENT_AMBULATORY_CARE_PROVIDER_SITE_OTHER): Payer: 59

## 2020-12-02 ENCOUNTER — Encounter: Payer: Self-pay | Admitting: Orthopaedic Surgery

## 2020-12-02 ENCOUNTER — Ambulatory Visit (INDEPENDENT_AMBULATORY_CARE_PROVIDER_SITE_OTHER): Payer: 59 | Admitting: Orthopaedic Surgery

## 2020-12-02 ENCOUNTER — Ambulatory Visit: Payer: 59

## 2020-12-02 DIAGNOSIS — M79642 Pain in left hand: Secondary | ICD-10-CM

## 2020-12-02 MED ORDER — METHYLPREDNISOLONE ACETATE 40 MG/ML IJ SUSP
13.3300 mg | INTRAMUSCULAR | Status: AC | PRN
  Administered 2020-12-02: 13.33 mg

## 2020-12-02 MED ORDER — BUPIVACAINE HCL 0.5 % IJ SOLN
0.3300 mL | INTRAMUSCULAR | Status: AC | PRN
  Administered 2020-12-02: .33 mL

## 2020-12-02 MED ORDER — LIDOCAINE HCL 1 % IJ SOLN
0.3000 mL | INTRAMUSCULAR | Status: AC | PRN
  Administered 2020-12-02: .3 mL

## 2020-12-02 NOTE — Progress Notes (Signed)
Office Visit Note   Patient: Danielle Jones           Date of Birth: 1960/05/29           MRN: [id number] Visit Date: 12/02/2020              Requested by: Rip Harbour, NP [address] [place],  [place] [address] PCP: Rip Harbour, NP   Assessment & Plan: Visit Diagnoses:  1. Pain in left hand     Plan: Impression is continued left hand pain 7 months status post left thumb CMC arthroplasty.  She seems to be quite symptomatic between the thumb and the second metacarpal base.  There is evidence on x-rays that there are degenerative changes there.  I would like to try cortisone injection in this area to see if this will help alleviate her symptoms.  She will give this 6 weeks and will call back if she continues to be symptomatic.  We would likely obtain MRI at that point.  Follow-Up Instructions: No follow-ups on file.   Orders:  Orders Placed This Encounter  Procedures  . XR Hand Complete Left   No orders of the defined types were placed in this encounter.     Procedures: Hand/UE Inj: L thumb CMC for osteoarthritis on 12/02/2020 9:55 AM Indications: pain Details: 25 G needle Medications: 0.3 mL lidocaine 1 %; 0.33 mL bupivacaine 0.5 %; 13.33 mg methylPREDNISolone acetate 40 MG/ML Outcome: tolerated well, no immediate complications     Clinical Data: No additional findings.   Subjective: Chief Complaint  Patient presents with  . Left Hand - Pain    HPI  Danielle Jones is a 60 year old female who underwent left thumb CMC arthroplasty on 05/12/2020 which is about 7 months ago.  She states that she continues to have pain at the base of the thumb with stiffness and inability to fully oppose the thumb.  This is her third surgery overall for CMC arthritis.  Currently taking over-the-counter medications.  Right-hand-dominant.  The pain is worse with activity especially if she is using her left hand a lot.  Denies any numbness and tingling.  Review of  Systems   Objective: Vital Signs: There were no vitals taken for this visit.  Physical Exam  Ortho Exam  Left hand shows fully healed surgical scar.  Negative Finkelstein's.  She lacks about 2 cm of opposition between thumb tip and fifth metacarpal head.  She has tenderness between the base of the thumb and second metacarpal.  There is no soft tissue crepitus.  Specialty Comments:  No specialty comments available.  Imaging: XR Hand Complete Left  Result Date: 12/02/2020 Postsurgical changes of trapeziectomy.  There is no evidence of implant complications.  There is spurring and degenerative changes between the first and second metacarpal base.  Degenerative changes of the thumb MP joint as well.    PMFS History: Patient Active Problem List   Diagnosis Date Noted  . Gout 02/05/2020  . Secondary hypertension 02/05/2020  . Vitamin B deficiency 02/05/2020  . Hypothyroidism 02/05/2020  . Anxiety 02/05/2020  . Depression 02/05/2020  . Hand pain, left 02/05/2020  . Family history of malignant melanoma 02/05/2020  . Elevated hemoglobin A1c 01/02/2019  . Encounter for vitamin deficiency screening 01/02/2019  . Low vitamin B12 level 01/02/2019  . Primary osteoarthritis of first carpometacarpal joint of left hand 09/25/2018  . GAD (generalized anxiety disorder) 04/10/2018  . Hyperlipidemia 04/10/2018  . Carpal tunnel syndrome of  left wrist 08/17/2017   Past Medical History:  Diagnosis Date  . Anxiety   . CMC arthritis    left thumb  . Depression   . Gout   . High cholesterol   . Hypertension   . Hypothyroidism     Family History  Problem Relation Age of Onset  . Arthritis Mother   . Alzheimer's disease Mother   . Kidney failure Mother   . Congenital heart disease Father   . Melanoma Father     Past Surgical History:  Procedure Laterality Date  . ABDOMINAL HYSTERECTOMY    . Carpal tunnel  2019  . CARPOMETACARPEL SUSPENSION PLASTY Left 05/12/2020   Procedure: LEFT  THUMB CARPOMETACARPAL ARTHROPLASTY;  Surgeon: Leandrew Koyanagi, MD;  Location: [facility];  Service: Orthopedics;  Laterality: Left;  . thumb release  2019   Social History   Occupational History  . Not on file  Tobacco Use  . Smoking status: Never  . Smokeless tobacco: Never  Substance and Sexual Activity  . Alcohol use: Yes    Comment: social  . Drug use: No  . Sexual activity: Not on file

## 2020-12-03 ENCOUNTER — Other Ambulatory Visit: Payer: Self-pay | Admitting: Nurse Practitioner

## 2020-12-09 ENCOUNTER — Encounter: Payer: 59 | Admitting: Physician Assistant

## 2020-12-22 ENCOUNTER — Other Ambulatory Visit: Payer: Self-pay | Admitting: Nurse Practitioner

## 2020-12-22 ENCOUNTER — Other Ambulatory Visit: Payer: Self-pay

## 2020-12-22 ENCOUNTER — Ambulatory Visit (INDEPENDENT_AMBULATORY_CARE_PROVIDER_SITE_OTHER): Payer: 59 | Admitting: Nurse Practitioner

## 2020-12-22 ENCOUNTER — Encounter: Payer: Self-pay | Admitting: Nurse Practitioner

## 2020-12-22 VITALS — BP 128/72 | HR 78 | Temp 97.4°F | Resp 18 | Ht 63.0 in | Wt 231.0 lb

## 2020-12-22 DIAGNOSIS — Z7689 Persons encountering health services in other specified circumstances: Secondary | ICD-10-CM

## 2020-12-22 DIAGNOSIS — I10 Essential (primary) hypertension: Secondary | ICD-10-CM

## 2020-12-22 DIAGNOSIS — R7301 Impaired fasting glucose: Secondary | ICD-10-CM

## 2020-12-22 DIAGNOSIS — Z6841 Body Mass Index (BMI) 40.0 and over, adult: Secondary | ICD-10-CM

## 2020-12-22 DIAGNOSIS — Z23 Encounter for immunization: Secondary | ICD-10-CM

## 2020-12-22 DIAGNOSIS — E782 Mixed hyperlipidemia: Secondary | ICD-10-CM | POA: Diagnosis not present

## 2020-12-22 LAB — COMPREHENSIVE METABOLIC PANEL
ALT: 16 IU/L (ref 0–32)
AST: 14 IU/L (ref 0–40)
Albumin/Globulin Ratio: 2 (ref 1.2–2.2)
Albumin: 4.7 g/dL (ref 3.8–4.8)
Alkaline Phosphatase: 114 IU/L (ref 44–121)
BUN/Creatinine Ratio: 14 (ref 12–28)
BUN: 15 mg/dL (ref 8–27)
Bilirubin Total: 0.5 mg/dL (ref 0.0–1.2)
CO2: 28 mmol/L (ref 20–29)
Calcium: 9.9 mg/dL (ref 8.7–10.3)
Chloride: 99 mmol/L (ref 96–106)
Creatinine, Ser: 1.04 mg/dL — ABNORMAL HIGH (ref 0.57–1.00)
Globulin, Total: 2.4 g/dL (ref 1.5–4.5)
Glucose: 110 mg/dL — ABNORMAL HIGH (ref 70–99)
Potassium: 3.8 mmol/L (ref 3.5–5.2)
Sodium: 141 mmol/L (ref 134–144)
Total Protein: 7.1 g/dL (ref 6.0–8.5)
eGFR: 61 mL/min/{1.73_m2} (ref >59)

## 2020-12-22 LAB — CBC WITH DIFFERENTIAL/PLATELET
Basophils Absolute: 0.1 10*3/uL (ref 0.0–0.2)
Basos: 1 %
EOS (ABSOLUTE): 0.2 10*3/uL (ref 0.0–0.4)
Eos: 4 %
Hematocrit: 36.8 % (ref 34.0–46.6)
Hemoglobin: 12.4 g/dL (ref 11.1–15.9)
Immature Grans (Abs): 0 10*3/uL (ref 0.0–0.1)
Immature Granulocytes: 1 %
Lymphocytes Absolute: 1.8 10*3/uL (ref 0.7–3.1)
Lymphs: 31 %
MCH: 29 pg (ref 26.6–33.0)
MCHC: 33.7 g/dL (ref 31.5–35.7)
MCV: 86 fL (ref 79–97)
Monocytes Absolute: 0.6 10*3/uL (ref 0.1–0.9)
Monocytes: 9 %
Neutrophils Absolute: 3.3 10*3/uL (ref 1.4–7.0)
Neutrophils: 54 %
Platelets: 304 10*3/uL (ref 150–450)
RBC: 4.27 x10E6/uL (ref 3.77–5.28)
RDW: 13.5 % (ref 11.7–15.4)
WBC: 6 10*3/uL (ref 3.4–10.8)

## 2020-12-22 MED ORDER — SEMAGLUTIDE-WEIGHT MANAGEMENT 0.25 MG/0.5ML ~~LOC~~ SOAJ: 0.2500 mg | SUBCUTANEOUS | 0 refills | Status: DC

## 2020-12-22 MED ORDER — OZEMPIC (0.25 OR 0.5 MG/DOSE) 2 MG/1.5ML ~~LOC~~ SOPN: 0.5000 mg | PEN_INJECTOR | SUBCUTANEOUS | 0 refills | Status: DC

## 2020-12-22 MED ORDER — SEMAGLUTIDE-WEIGHT MANAGEMENT 2.4 MG/0.75ML ~~LOC~~ SOAJ: 2.4000 mg | SUBCUTANEOUS | 0 refills | Status: DC

## 2020-12-22 MED ORDER — SEMAGLUTIDE(0.25 OR 0.5MG/DOS) 2 MG/1.5ML ~~LOC~~ SOPN: 0.2500 mg | PEN_INJECTOR | SUBCUTANEOUS | 0 refills | Status: DC

## 2020-12-22 MED ORDER — SEMAGLUTIDE-WEIGHT MANAGEMENT 0.5 MG/0.5ML ~~LOC~~ SOAJ
0.5000 mg | SUBCUTANEOUS | 0 refills | Status: AC
Start: 2021-01-20 — End: 2021-02-17

## 2020-12-22 MED ORDER — SEMAGLUTIDE-WEIGHT MANAGEMENT 1 MG/0.5ML ~~LOC~~ SOAJ: 1.0000 mg | SUBCUTANEOUS | 0 refills | Status: DC

## 2020-12-22 MED ORDER — SEMAGLUTIDE-WEIGHT MANAGEMENT 1.7 MG/0.75ML ~~LOC~~ SOAJ: 1.7000 mg | SUBCUTANEOUS | 0 refills | Status: DC

## 2020-12-22 NOTE — Progress Notes (Signed)
Established Patient Office Visit  Subjective:  Patient ID: Danielle Jones, female    DOB: 22-Jan-1961  Age: 60 y.o. MRN: [id number]  CC:  Chief Complaint  Patient presents with   Weight Check    HPI Danielle Jones presents for follow-up of weight management. She was prescribed Phentermine 37.5 mg daily on 09/16/20. States she had side effects of elevated BP, discontinued medication. States she would like to discuss other prescription medication options to assist with weight loss. She has co-morbidities of hypertension, hyperlipidemia, hypothyroidism, and BMI 40.92. She states she feels poorly due to morbid obesity. She has been walking regularly for physical activity.   Danielle Jones is scheduled to receive second Shingles vaccine. Screening mammogram due 01/2021 and colonoscopy 08/2021. States she will return for seasonal flu vaccine.   Lipid/Cholesterol, Follow-up  Last lipid panel Other pertinent labs  Lab Results  Component Value Date   CHOL 198 09/15/2020   HDL 48 09/15/2020   LDLCALC 123 (H) 09/15/2020   TRIG 152 (H) 09/15/2020   CHOLHDL 4.1 09/15/2020   Lab Results  Component Value Date   ALT 24 09/15/2020   AST 18 09/15/2020   PLT 312 09/15/2020   TSH 2.470 09/15/2020     She was last seen for this 3 months ago.  Management since that visit includes Lipitor 40 mg daily.  She reports excellent compliance with treatment. She is not having side effects.   Symptoms: No chest pain No chest pressure/discomfort  No dyspnea No lower extremity edema  No numbness or tingling of extremity No orthopnea  No palpitations No paroxysmal nocturnal dyspnea  No speech difficulty No syncope   Current diet: well balanced Current exercise: walking  The 10-year ASCVD risk score (Arnett DK, et al., 2019) is: 4% Hypertension, follow-up: She was last seen for hypertension 3 months ago.  BP at that visit was 158/78. Management since that visit includes Olmesartan/amlodipine/HCTZ, DASH  diet  She reports excellent compliance with treatment. She is not having side effects. , She is following a Low Sodium diet. She is exercising. She does not smoke.  Use of agents associated with hypertension: NSAIDS.   Outside blood pressures are not available. Symptoms: No chest pain No chest pressure  No palpitations No syncope  No dyspnea No orthopnea  No paroxysmal nocturnal dyspnea No lower extremity edema   Pertinent labs: Lab Results  Component Value Date   CHOL 198 09/15/2020   HDL 48 09/15/2020   LDLCALC 123 (H) 09/15/2020   TRIG 152 (H) 09/15/2020   CHOLHDL 4.1 09/15/2020   Lab Results  Component Value Date   NA 143 09/15/2020   K 4.3 09/15/2020   CREATININE 0.99 09/15/2020   EGFR 65 09/15/2020   GFRNONAA >60 05/10/2020   GLUCOSE 114 (H) 09/15/2020     The 10-year ASCVD risk score (Arnett DK, et al., 2019) is: 4%   Prediabetes, Follow-up  Lab Results  Component Value Date   HGBA1C 6.1 (H) 09/15/2020   GLUCOSE 114 (H) 09/15/2020   GLUCOSE 98 05/10/2020   GLUCOSE 96 02/05/2020    Last seen for for this3 months ago.  Management since that visit includes diet and exercise. Current symptoms include none and have been unchanged.  Prior visit with dietician: no Current diet: well balanced Current exercise: walking  Pertinent Labs:    Component Value Date/Time   CHOL 198 09/15/2020 0938   TRIG 152 (H) 09/15/2020 0938   CHOLHDL 4.1 09/15/2020 0938   CREATININE 0.99  09/15/2020 0938    Wt Readings from Last 3 Encounters:  12/22/20 231 lb (104.8 kg)  09/15/20 233 lb (105.7 kg)  05/28/20 223 lb (101.2 kg)     Past Medical History:  Diagnosis Date   Anxiety    CMC arthritis    left thumb   Depression    Gout    High cholesterol    Hypertension    Hypothyroidism     Past Surgical History:  Procedure Laterality Date   ABDOMINAL HYSTERECTOMY     Carpal tunnel  2019   CARPOMETACARPEL SUSPENSION PLASTY Left 05/12/2020   Procedure: LEFT  THUMB CARPOMETACARPAL ARTHROPLASTY;  Surgeon: Leandrew Koyanagi, MD;  Location: [facility];  Service: Orthopedics;  Laterality: Left;   thumb release  2019    Family History  Problem Relation Age of Onset   Arthritis Mother    Alzheimer's disease Mother    Kidney failure Mother    Congenital heart disease Father    Melanoma Father     Social History   Socioeconomic History   Marital status: Single    Spouse name: Not on file   Number of children: Not on file   Years of education: Not on file   Highest education level: Not on file  Occupational History   Not on file  Tobacco Use   Smoking status: Never   Smokeless tobacco: Never  Substance and Sexual Activity   Alcohol use: Yes    Comment: social   Drug use: No   Sexual activity: Not on file  Other Topics Concern   Not on file  Social History Narrative   Not on file   Social Determinants of Health   Financial Resource Strain: Not on file  Food Insecurity: Not on file  Transportation Needs: Not on file  Physical Activity: Not on file  Stress: Not on file  Social Connections: Not on file  Intimate Partner Violence: Not on file    Outpatient Medications Prior to Visit  Medication Sig Dispense Refill   allopurinol (ZYLOPRIM) 100 MG tablet 2 po qd 180 tablet 1   atorvastatin (LIPITOR) 40 MG tablet TAKE ONE TABLET BY MOUTH DAILY 90 tablet 1   ergocalciferol (VITAMIN D2) 1.25 MG (50000 UT) capsule TAKE 1 CAPSULE BY MOUTH 1 TIME A WEEK FOR 12 DOSES     levothyroxine (SYNTHROID) 50 MCG tablet TAKE 1 TABLET(50 MCG) BY MOUTH DAILY     meloxicam (MOBIC) 15 MG tablet TAKE 1/2 TABLET BY MOUTH TWICE DAILY AS NEEDED FOR PAIN 90 tablet 1   Olmesartan-amLODIPine-HCTZ 40-10-25 MG TABS TAKE ONE (1) TABLET BY MOUTH EVERY DAY 90 tablet 1   venlafaxine XR (EFFEXOR-XR) 150 MG 24 hr capsule TAKE 1 CAPSULE BY MOUTH DAILY WITH BREAKFAST 90 capsule 1   phentermine (ADIPEX-P) 37.5 MG tablet Take 1 tablet (37.5 mg total) by mouth  daily before breakfast. 30 tablet 0   No facility-administered medications prior to visit.    No Known Allergies  ROS Review of Systems  Constitutional:  Positive for fatigue. Negative for appetite change and unexpected weight change.  HENT:  Negative for congestion, ear pain, rhinorrhea, sinus pressure, sinus pain and tinnitus.   Eyes:  Negative for pain.  Respiratory:  Negative for cough and shortness of breath.   Cardiovascular:  Negative for chest pain, palpitations and leg swelling.  Gastrointestinal:  Negative for abdominal pain, constipation, diarrhea, nausea and vomiting.  Endocrine: Negative for cold intolerance, heat intolerance, polydipsia, polyphagia and polyuria.  Genitourinary:  Negative for dysuria, frequency and hematuria.  Musculoskeletal:  Positive for arthralgias (bilateral hands). Negative for back pain, joint swelling and myalgias.  Skin:  Negative for rash.  Allergic/Immunologic: Negative for environmental allergies.  Neurological:  Negative for dizziness and headaches.  Hematological:  Negative for adenopathy.  Psychiatric/Behavioral:  Negative for decreased concentration and sleep disturbance. The patient is not nervous/anxious.      Objective:    Physical Exam Constitutional:      Appearance: She is obese.  HENT:     Head: Normocephalic.     Right Ear: Tympanic membrane normal.     Left Ear: Tympanic membrane normal.     Nose: Nose normal.     Mouth/Throat:     Mouth: Mucous membranes are moist.  Neck:     Vascular: No carotid bruit.  Cardiovascular:     Rate and Rhythm: Normal rate and regular rhythm.     Pulses: Normal pulses.     Heart sounds: Normal heart sounds.  Pulmonary:     Effort: Pulmonary effort is normal.     Breath sounds: Normal breath sounds.  Abdominal:     General: Bowel sounds are normal.     Palpations: Abdomen is soft.     Tenderness: There is no abdominal tenderness. There is no guarding.  Musculoskeletal:         General: No swelling.  Skin:    General: Skin is warm and dry.     Capillary Refill: Capillary refill takes less than 2 seconds.  Neurological:     Mental Status: She is alert and oriented to person, place, and time.  Psychiatric:        Mood and Affect: Mood normal.        Behavior: Behavior normal.    BP 128/72   Pulse 78   Temp (!) 97.4 F (36.3 C)   Resp 18   Ht _0  (1.6 m)   Wt 231 lb (104.8 kg)   SpO2 98%   BMI 40.92 kg/m  Wt Readings from Last 3 Encounters:  12/22/20 231 lb (104.8 kg)  09/15/20 233 lb (105.7 kg)  05/28/20 223 lb (101.2 kg)     Health Maintenance Due  Topic Date Due   HIV Screening  Never done   Hepatitis C Screening  Never done   TETANUS/TDAP  Never done   PAP SMEAR-Modifier  Never done   COVID-19 Vaccine (3 - Pfizer risk series) 08/15/2019   Pneumococcal Vaccine 86-75 Years old (2 - PCV) 01/02/2020   INFLUENZA VACCINE  08/30/2020   Zoster Vaccines- Shingrix (2 of 2) 11/11/2020      Lab Results  Component Value Date   TSH 2.470 09/15/2020   Lab Results  Component Value Date   WBC 7.3 09/15/2020   HGB 12.1 09/15/2020   HCT 37.4 09/15/2020   MCV 90 09/15/2020   PLT 312 09/15/2020   Lab Results  Component Value Date   NA 143 09/15/2020   K 4.3 09/15/2020   CO2 23 09/15/2020   GLUCOSE 114 (H) 09/15/2020   BUN 15 09/15/2020   CREATININE 0.99 09/15/2020   BILITOT 0.4 09/15/2020   ALKPHOS 133 (H) 09/15/2020   AST 18 09/15/2020   ALT 24 09/15/2020   PROT 6.8 09/15/2020   ALBUMIN 4.6 09/15/2020   CALCIUM 10.0 09/15/2020   ANIONGAP 8 05/10/2020   EGFR 65 09/15/2020   Lab Results  Component Value Date   CHOL 198 09/15/2020   Lab Results  Component Value Date   HDL 48 09/15/2020   Lab Results  Component Value Date   LDLCALC 123 (H) 09/15/2020   Lab Results  Component Value Date   TRIG 152 (H) 09/15/2020   Lab Results  Component Value Date   CHOLHDL 4.1 09/15/2020   Lab Results  Component Value Date   HGBA1C  6.1 (H) 09/15/2020      Assessment & Plan:  1. Mixed hyperlipidemia-well controlled - Lipid Panel -continue Lipitor 40 mg daily  2. Primary hypertension-well controlled - CBC with Differential/Platelet - Comprehensive metabolic panel -continue Olmesartan-Amlodipine-HCTZ  3. Encounter for weight management - Semaglutide-Weight Management 0.25 MG/0.5ML SOAJ; Inject 0.25 mg into the skin once a week for 28 days.  Dispense: 2 mL; Refill: 0 - Semaglutide-Weight Management 0.5 MG/0.5ML SOAJ; Inject 0.5 mg into the skin once a week for 28 days.  Dispense: 2 mL; Refill: 0 - Semaglutide-Weight Management 2.4 MG/0.75ML SOAJ; Inject 2.4 mg into the skin once a week for 28 days.  Dispense: 3 mL; Refill: 0 - Semaglutide-Weight Management 1 MG/0.5ML SOAJ; Inject 1 mg into the skin once a week for 28 days.  Dispense: 2 mL; Refill: 0 - Semaglutide-Weight Management 1.7 MG/0.75ML SOAJ; Inject 1.7 mg into the skin once a week for 28 days.  Dispense: 3 mL; Refill: 0  4. Impaired fasting glucose - Semaglutide-Weight Management 0.25 MG/0.5ML SOAJ; Inject 0.25 mg into the skin once a week for 28 days.  Dispense: 2 mL; Refill: 0 - Semaglutide-Weight Management 0.5 MG/0.5ML SOAJ; Inject 0.5 mg into the skin once a week for 28 days.  Dispense: 2 mL; Refill: 0 - Semaglutide-Weight Management 2.4 MG/0.75ML SOAJ; Inject 2.4 mg into the skin once a week for 28 days.  Dispense: 3 mL; Refill: 0 - Semaglutide-Weight Management 1 MG/0.5ML SOAJ; Inject 1 mg into the skin once a week for 28 days.  Dispense: 2 mL; Refill: 0 - Semaglutide-Weight Management 1.7 MG/0.75ML SOAJ; Inject 1.7 mg into the skin once a week for 28 days.  Dispense: 3 mL; Refill: 0  5. BMI 40.0-44.9, adult (HCC) - Semaglutide-Weight Management 0.25 MG/0.5ML SOAJ; Inject 0.25 mg into the skin once a week for 28 days.  Dispense: 2 mL; Refill: 0 - Semaglutide-Weight Management 0.5 MG/0.5ML SOAJ; Inject 0.5 mg into the skin once a week for 28 days.   Dispense: 2 mL; Refill: 0 - Semaglutide-Weight Management 2.4 MG/0.75ML SOAJ; Inject 2.4 mg into the skin once a week for 28 days.  Dispense: 3 mL; Refill: 0 - Semaglutide-Weight Management 1 MG/0.5ML SOAJ; Inject 1 mg into the skin once a week for 28 days.  Dispense: 2 mL; Refill: 0 - Semaglutide-Weight Management 1.7 MG/0.75ML SOAJ; Inject 1.7 mg into the skin once a week for 28 days.  Dispense: 3 mL; Refill: 0 - Lipid Panel  6. Morbid obesity with BMI of 40.0-44.9, adult (HCC) - Semaglutide-Weight Management 0.25 MG/0.5ML SOAJ; Inject 0.25 mg into the skin once a week for 28 days.  Dispense: 2 mL; Refill: 0 - Semaglutide-Weight Management 0.5 MG/0.5ML SOAJ; Inject 0.5 mg into the skin once a week for 28 days.  Dispense: 2 mL; Refill: 0 - Semaglutide-Weight Management 2.4 MG/0.75ML SOAJ; Inject 2.4 mg into the skin once a week for 28 days.  Dispense: 3 mL; Refill: 0 - Semaglutide-Weight Management 1 MG/0.5ML SOAJ; Inject 1 mg into the skin once a week for 28 days.  Dispense: 2 mL; Refill: 0 - Semaglutide-Weight Management 1.7 MG/0.75ML SOAJ; Inject 1.7 mg into the skin once  a week for 28 days.  Dispense: 3 mL; Refill: 0 - Lipid Panel - CBC with Differential/Platelet - Comprehensive metabolic panel  7. Need for shingles vaccine - Varicella-zoster vaccine IM (Shingrix)    We with send prior authorization for [facility]  Continue medications We will call you with lab results Continue heart healthy and physical activity Shingles vaccine Follow-up in 25-month  Follow-up:  351-month I, ShRip HarbourNP, have reviewed all documentation for this visit. The documentation on 12/22/20 for the exam, diagnosis, procedures, and orders are all accurate and complete.   Signed, ShRip HarbourNP

## 2020-12-22 NOTE — Patient Instructions (Addendum)
We with send prior authorization for [facility]  Continue medications We will call you with lab results Continue heart healthy and physical activity Shingles vaccine Follow-up in 75-months  Recombinant Zoster (Shingles) Vaccine: What You Need to Know 1. Why get vaccinated? Recombinant zoster (shingles) vaccine can prevent shingles. Shingles (also called herpes zoster, or just zoster) is a painful skin rash, usually with blisters. In addition to the rash, shingles can cause fever, headache, chills, or upset stomach. Rarely, shingles can lead to complications such as pneumonia, hearing problems, blindness, brain inflammation (encephalitis), or death. The risk of shingles increases with age. The most common complication of shingles is long-term nerve pain called postherpetic neuralgia (PHN). PHN occurs in the areas where the shingles rash was and can last for months or years after the rash goes away. The pain from PHN can be severe and debilitating. The risk of PHN increases with age. An older adult with shingles is more likely to develop PHN and have longer lasting and more severe pain than a younger person. People with weakened immune systems also have a higher risk of getting shingles and complications from the disease. Shingles is caused by varicella-zoster virus, the same virus that causes chickenpox. After you have chickenpox, the virus stays in your body and can cause shingles later in life. Shingles cannot be passed from one person to another, but the virus that causes shingles can spread and cause chickenpox in someone who has never had chickenpox or has never received chickenpox vaccine. 2. Recombinant shingles vaccine Recombinant shingles vaccine provides strong protection against shingles. By preventing shingles, recombinant shingles vaccine also protects against PHN and other complications. Recombinant shingles vaccine is recommended for: Adults 21 years and older Adults 19 years and older  who have a weakened immune system because of disease or treatments Shingles vaccine is given as a two-dose series. For most people, the second dose should be given 2 to 6 months after the first dose. Some people who have or will have a weakened immune system can get the second dose 1 to 2 months after the first dose. Ask your health care provider for guidance. People who have had shingles in the past and people who have received varicella (chickenpox) vaccine are recommended to get recombinant shingles vaccine. The vaccine is also recommended for people who have already gotten another type of shingles vaccine, the live shingles vaccine. There is no live virus in recombinant shingles vaccine. Shingles vaccine may be given at the same time as other vaccines. 3. Talk with your health care provider Tell your vaccination provider if the person getting the vaccine: Has had an allergic reaction after a previous dose of recombinant shingles vaccine, or has any severe, life-threatening allergies Is currently experiencing an episode of shingles Is pregnant In some cases, your health care provider may decide to postpone shingles vaccination until a future visit. People with minor illnesses, such as a cold, may be vaccinated. People who are moderately or severely ill should usually wait until they recover before getting recombinant shingles vaccine. Your health care provider can give you more information. 4. Risks of a vaccine reaction A sore arm with mild or moderate pain is very common after recombinant shingles vaccine. Redness and swelling can also happen at the site of the injection. Tiredness, muscle pain, headache, shivering, fever, stomach pain, and nausea are common after recombinant shingles vaccine. These side effects may temporarily prevent a vaccinated person from doing regular activities. Symptoms usually go away on their own  in 2 to 3 days. You should still get the second dose of recombinant  shingles vaccine even if you had one of these reactions after the first dose. Guillain-Barr syndrome (GBS), a serious nervous system disorder, has been reported very rarely after recombinant zoster vaccine. People sometimes faint after medical procedures, including vaccination. Tell your provider if you feel dizzy or have vision changes or ringing in the ears. As with any medicine, there is a very remote chance of a vaccine causing a severe allergic reaction, other serious injury, or death. 5. What if there is a serious problem? An allergic reaction could occur after the vaccinated person leaves the clinic. If you see signs of a severe allergic reaction (hives, swelling of the face and throat, difficulty breathing, a fast heartbeat, dizziness, or weakness), call 9-1-1 and get the person to the nearest hospital. For other signs that concern you, call your health care provider. Adverse reactions should be reported to the Vaccine Adverse Event Reporting System (VAERS). Your health care provider will usually file this report, or you can do it yourself. Visit the VAERS website at www.vaers.SamedayNews.es or call 5406872934. VAERS is only for reporting reactions, and VAERS staff members do not give medical advice. 6. How can I learn more? Ask your health care provider. Call your local or state health department. Visit the website of the Food and Drug Administration (FDA) for vaccine package inserts and additional information at http://lopez-wang.org/. Contact the Centers for Disease Control and Prevention (CDC): Call 432-711-3040 (1-800-CDC-INFO) or Visit CDC's website at http://hunter.com/. Vaccine Information Statement Recombinant Zoster Vaccine (03/05/2020) This information is not intended to replace advice given to you by your health care provider. Make sure you discuss any questions you have with your health care provider. Document Revised: 10/01/2020 Document Reviewed:  03/19/2020 Elsevier Patient Education  2022 [employer]. Exercising to [employer] Getting regular exercise is important for everyone. It is especially important if you are overweight. Being overweight increases your risk of heart disease, stroke, diabetes, high blood pressure, and several types of cancer. Exercising, and reducing the calories you consume, can help you lose weight and improve fitness and health. Exercise can be moderate or vigorous intensity. To lose weight, most people need to do a certain amount of moderate or vigorous-intensity exercise each week. How can exercise affect me? You lose weight when you exercise enough to burn more calories than you eat. Exercise also reduces body fat and builds muscle. The more muscle you have, the more calories you burn. Exercise also: Improves mood. Reduces stress and tension. Improves your overall fitness, flexibility, and endurance. Increases bone strength. Moderate-intensity exercise Moderate-intensity exercise is any activity that gets you moving enough to burn at least three times more energy (calories) than if you were sitting. Examples of moderate exercise include: Walking a mile in 15 minutes. Doing light yard work. Biking at an easy pace. Most people should get at least 150 minutes of moderate-intensity exercise a week to maintain their body weight. Vigorous-intensity exercise Vigorous-intensity exercise is any activity that gets you moving enough to burn at least six times more calories than if you were sitting. When you exercise at this intensity, you should be working hard enough that you are not able to carry on a conversation. Examples of vigorous exercise include: Running. Playing a team sport, such as football, basketball, and soccer. Jumping rope. Most people should get at least 75 minutes a week of vigorous exercise to maintain their body weight. What actions can I  take to lose weight? The amount of exercise you need  to lose weight depends on: Your age. The type of exercise. Any health conditions you have. Your overall physical ability. Talk to your health care provider about how much exercise you need and what types of activities are safe for you. Nutrition  Make changes to your diet as told by your health care provider or diet and nutrition specialist (dietitian). This may include: Eating fewer calories. Eating more protein. Eating less unhealthy fats. Eating a diet that includes fresh fruits and vegetables, whole grains, low-fat dairy products, and lean protein. Avoiding foods with added fat, salt, and sugar. Drink plenty of water while you exercise to prevent dehydration or heat stroke. Activity Choose an activity that you enjoy and set realistic goals. Your health care provider can help you make an exercise plan that works for you. Exercise at a moderate or vigorous intensity most days of the week. The intensity of exercise may vary from person to person. You can tell how intense a workout is for you by paying attention to your breathing and heartbeat. Most people will notice their breathing and heartbeat get faster with more intense exercise. Do resistance training twice each week, such as: Push-ups. Sit-ups. Lifting weights. Using resistance bands. Getting short amounts of exercise can be just as helpful as long, structured periods of exercise. If you have trouble finding time to exercise, try doing these things as part of your daily routine: Get up, stretch, and walk around every 30 minutes throughout the day. Go for a walk during your lunch break. Park your car farther away from your destination. If you take public transportation, get off one stop early and walk the rest of the way. Make phone calls while standing up and walking around. Take the stairs instead of elevators or escalators. Wear comfortable clothes and shoes with good support. Do not exercise so much that you hurt yourself,  feel dizzy, or get very short of breath. Where to find more information U.S. Department of Health and Human Services: Bonded[employer].at Centers for Disease Control and Prevention: http://www.wolf.info/ Contact a health care provider: Before starting a new exercise program. If you have questions or concerns about your weight. If you have a medical problem that keeps you from exercising. Get help right away if: You have any of the following while exercising: Injury. Dizziness. Difficulty breathing or shortness of breath that does not go away when you stop exercising. Chest pain. Rapid heartbeat. These symptoms may represent a serious problem that is an emergency. Do not wait to see if the symptoms will go away. Get medical help right away. Call your local emergency services (911 in the [place] Do not drive yourself to the hospital. Summary Getting regular exercise is especially important if you are overweight. Being overweight increases your risk of heart disease, stroke, diabetes, high blood pressure, and several types of cancer. Losing weight happens when you burn more calories than you eat. Reducing the amount of calories you eat, and getting regular moderate or vigorous exercise each week, helps you lose weight. This information is not intended to replace advice given to you by your health care provider. Make sure you discuss any questions you have with your health care provider. Document Revised: 03/14/2020 Document Reviewed: 03/14/2020 Elsevier Patient Education  [place] Injection ([employer]) What is this medication? SEMAGLUTIDE (SEM a GLOO tide) promotes weight loss. It may also be used to maintain weight loss. It works  by decreasing appetite. Changes to diet and exercise are often combined with this medication. This medicine may be used for other purposes; ask your health care provider or pharmacist if you have questions. COMMON BRAND NAME(S): HWTUUE What  should I tell my care team before I take this medication? They need to know if you have any of these conditions: Endocrine tumors (MEN 2) or if someone in your family had these tumors Eye disease, vision problems Gallbladder disease History of depression or mental health disease History of pancreatitis Kidney disease Stomach or intestine problems Suicidal thoughts, plans, or attempt; a previous suicide attempt by you or a family member Thyroid cancer or if someone in your family had thyroid cancer An unusual or allergic reaction to semaglutide, other medications, foods, dyes, or preservatives Pregnant or trying to get pregnant Breast-feeding How should I use this medication? This medication is injected under the skin. You will be taught how to prepare and give it. Take it as directed on the prescription label. It is given once every week (every 7 days). Keep taking it unless your care team tells you to stop. It is important that you put your used needles and pens in a special sharps container. Do not put them in a trash can. If you do not have a sharps container, call your pharmacist or care team to get one. A special MedGuide will be given to you by the pharmacist with each prescription and refill. Be sure to read this information carefully each time. This medication comes with INSTRUCTIONS FOR USE. Ask your pharmacist for directions on how to use this medication. Read the information carefully. Talk to your pharmacist or care team if you have questions. Talk to your care team about the use of this medication in children. Special care may be needed. Overdosage: If you think you have taken too much of this medicine contact a poison control center or emergency room at once. NOTE: This medicine is only for you. Do not share this medicine with others. What if I miss a dose? If you miss a dose and the next scheduled dose is more than 2 days away, take the missed dose as soon as possible. If you  miss a dose and the next scheduled dose is less than 2 days away, do not take the missed dose. Take the next dose at your regular time. Do not take double or extra doses. If you miss your dose for 2 weeks or more, take the next dose at your regular time or call your care team to talk about how to restart this medication. What may interact with this medication? Insulin and other medications for diabetes This list may not describe all possible interactions. Give your health care provider a list of all the medicines, herbs, non-prescription drugs, or dietary supplements you use. Also tell them if you smoke, drink alcohol, or use illegal drugs. Some items may interact with your medicine. What should I watch for while using this medication? Visit your care team for regular checks on your progress. It may be some time before you see the benefit from this medication. Drink plenty of fluids while taking this medication. Check with your care team if you have severe diarrhea, nausea, and vomiting, or if you sweat a lot. The loss of too much body fluid may make it dangerous for you to take this medication. This medication may affect blood sugar levels. Ask your care team if changes in diet or medications are needed if you  have diabetes. If you or your family notice any changes in your behavior, such as new or worsening depression, thoughts of harming yourself, anxiety, other unusual or disturbing thoughts, or memory loss, call your care team right away. Women should inform their care team if they wish to become pregnant or think they might be pregnant. Losing weight while pregnant is not advised and may cause harm to the unborn child. Talk to your care team for more information. What side effects may I notice from receiving this medication? Side effects that you should report to your care team as soon as possible: Allergic reactions--skin rash, itching, hives, swelling of the face, lips, tongue, or throat Change  in vision Dehydration--increased thirst, dry mouth, feeling faint or lightheaded, headache, dark yellow or brown urine Gallbladder problems--severe stomach pain, nausea, vomiting, fever Heart palpitations--rapid, pounding, or irregular heartbeat Kidney injury--decrease in the amount of urine, swelling of the ankles, hands, or feet Pancreatitis--severe stomach pain that spreads to your back or gets worse after eating or when touched, fever, nausea, vomiting Thoughts of suicide or self-harm, worsening mood, feelings of depression Thyroid cancer--new mass or lump in the neck, pain or trouble swallowing, trouble breathing, hoarseness Side effects that usually do not require medical attention (report to your care team if they continue or are bothersome): Diarrhea Loss of appetite Nausea Stomach pain Vomiting This list may not describe all possible side effects. Call your doctor for medical advice about side effects. You may report side effects to FDA at 1-800-FDA-1088. Where should I keep my medication? Keep out of the reach of children and pets. Refrigeration (preferred): Store in the refrigerator. Do not freeze. Keep this medication in the original container until you are ready to take it. Get rid of any unused medication after the expiration date. Room temperature: If needed, prior to cap removal, the pen can be stored at room temperature for up to 28 days. Protect from light. If it is stored at room temperature, get rid of any unused medication after 28 days or after it expires, whichever is first. It is important to get rid of the medication as soon as you no longer need it or it is expired. You can do this in two ways: Take the medication to a medication take-back program. Check with your pharmacy or law enforcement to find a location. If you cannot return the medication, follow the directions in the [place]. NOTE: This sheet is a summary. It may not cover all possible information. If you  have questions about this medicine, talk to your doctor, pharmacist, or health care provider.  2022 Elsevier/Gold Standard (2020-04-23 00:00:00)

## 2020-12-23 LAB — LIPID PANEL
Chol/HDL Ratio: 3.5 ratio (ref 0.0–4.4)
Cholesterol, Total: 166 mg/dL (ref 100–199)
HDL: 47 mg/dL (ref >39)
LDL Chol Calc (NIH): 96 mg/dL (ref 0–99)
Triglycerides: 131 mg/dL (ref 0–149)
VLDL Cholesterol Cal: 23 mg/dL (ref 5–40)

## 2020-12-23 LAB — CARDIOVASCULAR RISK ASSESSMENT

## 2020-12-27 ENCOUNTER — Telehealth: Payer: Self-pay

## 2020-12-27 NOTE — Telephone Encounter (Signed)
Arista from [facility] called stating that the patient's Rx for Ozempic is on back order and has been for over a month and stated that she had tried calling us on 11/23 but never got an answer back. She is wanting to know what you prefer the patient to do about the Rx. Please advise.   Call back number is [phone]

## 2020-12-28 ENCOUNTER — Other Ambulatory Visit: Payer: Self-pay

## 2020-12-28 DIAGNOSIS — Z7689 Persons encountering health services in other specified circumstances: Secondary | ICD-10-CM

## 2020-12-28 DIAGNOSIS — R7301 Impaired fasting glucose: Secondary | ICD-10-CM

## 2020-12-28 DIAGNOSIS — Z6841 Body Mass Index (BMI) 40.0 and over, adult: Secondary | ICD-10-CM

## 2020-12-28 MED ORDER — SEMAGLUTIDE-WEIGHT MANAGEMENT 0.25 MG/0.5ML ~~LOC~~ SOAJ: 0.2500 mg | SUBCUTANEOUS | 0 refills | Status: AC

## 2020-12-29 ENCOUNTER — Telehealth: Payer: Self-pay

## 2020-12-29 NOTE — Telephone Encounter (Signed)
Unable to obtain semaglutide due to national shortage, Per Larene [place] if patient is willing she can start rybelsus. Patient agreed will pick up samples tomorrow.

## 2021-01-05 ENCOUNTER — Encounter: Payer: 59 | Admitting: Physician Assistant

## 2021-01-13 ENCOUNTER — Other Ambulatory Visit: Payer: Self-pay

## 2021-01-13 ENCOUNTER — Other Ambulatory Visit: Payer: Self-pay | Admitting: Nurse Practitioner

## 2021-01-13 DIAGNOSIS — M109 Gout, unspecified: Secondary | ICD-10-CM

## 2021-01-17 ENCOUNTER — Ambulatory Visit: Payer: 59

## 2021-01-18 ENCOUNTER — Ambulatory Visit (INDEPENDENT_AMBULATORY_CARE_PROVIDER_SITE_OTHER): Payer: 59

## 2021-01-18 ENCOUNTER — Other Ambulatory Visit: Payer: Self-pay

## 2021-01-18 DIAGNOSIS — Z23 Encounter for immunization: Secondary | ICD-10-CM | POA: Diagnosis not present

## 2021-01-19 ENCOUNTER — Other Ambulatory Visit: Payer: Self-pay | Admitting: Nurse Practitioner

## 2021-01-19 LAB — HGB A1C W/O EAG: Hgb A1c MFr Bld: 6.2 % — ABNORMAL HIGH (ref 4.8–5.6)

## 2021-01-19 LAB — SPECIMEN STATUS REPORT

## 2021-01-25 ENCOUNTER — Other Ambulatory Visit: Payer: Self-pay | Admitting: Nurse Practitioner

## 2021-01-25 DIAGNOSIS — R7303 Prediabetes: Secondary | ICD-10-CM

## 2021-01-25 DIAGNOSIS — R7301 Impaired fasting glucose: Secondary | ICD-10-CM

## 2021-01-25 MED ORDER — METFORMIN HCL 500 MG PO TABS: 500.0000 mg | ORAL_TABLET | Freq: Two times a day (BID) | ORAL | 3 refills | Status: DC

## 2021-02-18 ENCOUNTER — Other Ambulatory Visit: Payer: Self-pay

## 2021-02-18 MED ORDER — SEMAGLUTIDE (1 MG/DOSE) 4 MG/3ML ~~LOC~~ SOPN: 1.0000 mg | PEN_INJECTOR | SUBCUTANEOUS | 0 refills | Status: DC

## 2021-02-18 NOTE — Telephone Encounter (Signed)
[facility] called stating semaglutide in 1mg /0.18ml is not available requested verbal ok to change to 4mg /25ml. Verbal ok given.

## 2021-02-26 ENCOUNTER — Other Ambulatory Visit: Payer: Self-pay | Admitting: Nurse Practitioner

## 2021-02-26 DIAGNOSIS — M109 Gout, unspecified: Secondary | ICD-10-CM

## 2021-03-04 ENCOUNTER — Ambulatory Visit: Payer: 59

## 2021-03-04 ENCOUNTER — Telehealth: Payer: Self-pay | Admitting: Nurse Practitioner

## 2021-03-04 ENCOUNTER — Other Ambulatory Visit: Payer: Self-pay

## 2021-03-04 ENCOUNTER — Other Ambulatory Visit: Payer: Self-pay | Admitting: Nurse Practitioner

## 2021-03-04 DIAGNOSIS — R112 Nausea with vomiting, unspecified: Secondary | ICD-10-CM

## 2021-03-04 MED ORDER — PROMETHAZINE HCL 25 MG PO TABS: 25.0000 mg | ORAL_TABLET | Freq: Three times a day (TID) | ORAL | 1 refills | Status: DC | PRN

## 2021-03-04 MED ORDER — ONDANSETRON HCL 4 MG PO TABS: 4.0000 mg | ORAL_TABLET | Freq: Three times a day (TID) | ORAL | 1 refills | Status: DC | PRN

## 2021-03-04 NOTE — Telephone Encounter (Signed)
Patient approved for Ozempic for pre-diabetes. Patient was given 1 mg dose rather than starting dose of 0.25 mg injection weekly. States she had 1 mg injection on Monday, Jan 30th, 2023. She has experienced severe nausea and vomiting since. Patient came to office for nurse visit for written and verbal instructions on Ozempic administration. Zofran and Phenergan prescriptions sent to [facility], [place].Patient verbalized understanding.

## 2021-03-15 ENCOUNTER — Telehealth (INDEPENDENT_AMBULATORY_CARE_PROVIDER_SITE_OTHER): Payer: 59 | Admitting: Nurse Practitioner

## 2021-03-15 ENCOUNTER — Encounter: Payer: Self-pay | Admitting: Nurse Practitioner

## 2021-03-15 DIAGNOSIS — R051 Acute cough: Secondary | ICD-10-CM | POA: Diagnosis not present

## 2021-03-15 DIAGNOSIS — U071 COVID-19: Secondary | ICD-10-CM | POA: Diagnosis not present

## 2021-03-15 LAB — POC COVID19 BINAXNOW: SARS Coronavirus 2 Ag: POSITIVE — AB

## 2021-03-15 MED ORDER — FLUTICASONE PROPIONATE 50 MCG/ACT NA SUSP: 2.0000 | Freq: Every day | NASAL | 6 refills | Status: DC

## 2021-03-15 MED ORDER — PROMETHAZINE-DM 6.25-15 MG/5ML PO SYRP: 5.0000 mL | ORAL_SOLUTION | Freq: Four times a day (QID) | ORAL | 0 refills | Status: DC | PRN

## 2021-03-15 MED ORDER — MOLNUPIRAVIR EUA 200MG CAPSULE: 4.0000 | ORAL_CAPSULE | Freq: Two times a day (BID) | ORAL | 0 refills | Status: AC

## 2021-03-15 NOTE — Progress Notes (Signed)
Virtual Visit via Telephone Note   This visit type was conducted due to national recommendations for restrictions regarding the COVID-19 Pandemic (e.g. social distancing) in an effort to limit this patient's exposure and mitigate transmission in our community.  Due to her co-morbid illnesses, this patient is at least at moderate risk for complications without adequate follow up.  This format is felt to be most appropriate for this patient at this time.  The patient did not have access to video technology/had technical difficulties with video requiring transitioning to audio format only (telephone).  All issues noted in this document were discussed and addressed.  No physical exam could be performed with this format.  Patient verbally consented to a telehealth visit.   Date:  03/15/2021   ID:  Danielle Jones, DOB 01-17-1961, MRN [id number]  Patient Location: Home Provider Location: Office/Clinic  PCP:  Rip Harbour, NP   Evaluation Performed:  Established patient, acute telemedicine visit  Chief Complaint:  Sinus congestion  History of Present Illness:    Danielle Jones is a 61 y.o. female with Upper respiratory symptoms She complains of sinus congestion, generalized body aches, and headache. . Onset of symptoms was yesterday and worsening. She has not taking any treatments for symptoms. Patient is non-smoker   The patient does have symptoms concerning for COVID-19 infection (fever, chills, cough, or new shortness of breath).    Past Medical History:  Diagnosis Date   Anxiety    CMC arthritis    left thumb   Depression    Gout    High cholesterol    Hypertension    Hypothyroidism     Past Surgical History:  Procedure Laterality Date   ABDOMINAL HYSTERECTOMY     Carpal tunnel  2019   CARPOMETACARPEL SUSPENSION PLASTY Left 05/12/2020   Procedure: LEFT THUMB CARPOMETACARPAL ARTHROPLASTY;  Surgeon: Leandrew Koyanagi, MD;  Location: [facility];  Service:  Orthopedics;  Laterality: Left;   thumb release  2019    Family History  Problem Relation Age of Onset   Arthritis Mother    Alzheimer's disease Mother    Kidney failure Mother    Congenital heart disease Father    Melanoma Father     Social History   Socioeconomic History   Marital status: Single    Spouse name: Not on file   Number of children: Not on file   Years of education: Not on file   Highest education level: Not on file  Occupational History   Not on file  Tobacco Use   Smoking status: Never   Smokeless tobacco: Never  Substance and Sexual Activity   Alcohol use: Yes    Comment: social   Drug use: No   Sexual activity: Not on file  Other Topics Concern   Not on file  Social History Narrative   Not on file   Social Determinants of Health   Financial Resource Strain: Not on file  Food Insecurity: Not on file  Transportation Needs: Not on file  Physical Activity: Not on file  Stress: Not on file  Social Connections: Not on file  Intimate Partner Violence: Not on file    Outpatient Medications Prior to Visit  Medication Sig Dispense Refill   allopurinol (ZYLOPRIM) 100 MG tablet TAKE ONE TABLET BY MOUTH EVERY DAY 30 tablet 3   atorvastatin (LIPITOR) 40 MG tablet TAKE ONE TABLET BY MOUTH DAILY 90 tablet 1   ergocalciferol (VITAMIN D2) 1.25 MG (50000 UT)  capsule TAKE 1 CAPSULE BY MOUTH 1 TIME A WEEK FOR 12 DOSES     levothyroxine (SYNTHROID) 50 MCG tablet TAKE ONE TABLET BY MOUTH DAILY 90 tablet 1   meloxicam (MOBIC) 15 MG tablet TAKE 1/2 TABLET BY MOUTH TWICE DAILY AS NEEDED FOR PAIN 90 tablet 1   metFORMIN (GLUCOPHAGE) 500 MG tablet Take 1 tablet (500 mg total) by mouth 2 (two) times daily with a meal. 180 tablet 3   Olmesartan-amLODIPine-HCTZ 40-10-25 MG TABS TAKE ONE (1) TABLET BY MOUTH EVERY DAY 90 tablet 1   ondansetron (ZOFRAN) 4 MG tablet Take 1 tablet (4 mg total) by mouth every 8 (eight) hours as needed for nausea or vomiting. 30 tablet 1    promethazine (PHENERGAN) 25 MG tablet Take 1 tablet (25 mg total) by mouth every 8 (eight) hours as needed for nausea or vomiting. 20 tablet 1   Semaglutide, 1 MG/DOSE, 4 MG/3ML SOPN Inject 1 mg as directed once a week. 3 mL 0   Semaglutide,0.25 or 0.5MG /DOS, (OZEMPIC, 0.25 OR 0.5 MG/DOSE,) 2 MG/1.5ML SOPN Inject 0.5 mg into the skin once a week. 3 mL 0   Semaglutide,0.25 or 0.5MG /DOS, 2 MG/1.5ML SOPN Inject 0.25 mg into the skin once a week. Inject 0.25 mg into the skin once a week for four weeks;then inject 0.5 mg into the skin once a week for four weeks; then inject 1 mg into the skin once a week for four weeks 3 mL 0   [START ON 03/19/2021] Semaglutide-Weight Management 1.7 MG/0.75ML SOAJ Inject 1.7 mg into the skin once a week for 28 days. 3 mL 0   [START ON 04/17/2021] Semaglutide-Weight Management 2.4 MG/0.75ML SOAJ Inject 2.4 mg into the skin once a week for 28 days. 3 mL 0   venlafaxine XR (EFFEXOR-XR) 150 MG 24 hr capsule TAKE 1 CAPSULE BY MOUTH DAILY WITH BREAKFAST 90 capsule 1   No facility-administered medications prior to visit.    Allergies:   Patient has no known allergies.   Social History   Tobacco Use   Smoking status: Never   Smokeless tobacco: Never  Substance Use Topics   Alcohol use: Yes    Comment: social   Drug use: No     Review of Systems  Constitutional:  Positive for fever and malaise/fatigue.  HENT:  Positive for congestion, sinus pain and sore throat.   Respiratory:  Positive for cough.   Musculoskeletal:  Positive for myalgias (generalized body aches).  Neurological:  Positive for headaches.    Labs/Other Tests and Data Reviewed:    Recent Labs: 09/15/2020: TSH 2.470 12/22/2020: ALT 16; BUN 15; Creatinine, Ser 1.04; Hemoglobin 12.4; Platelets 304; Potassium 3.8; Sodium 141   Recent Lipid Panel Lab Results  Component Value Date/Time   CHOL 166 12/22/2020 08:28 AM   TRIG 131 12/22/2020 08:28 AM   HDL 47 12/22/2020 08:28 AM   CHOLHDL 3.5 12/22/2020  08:28 AM   LDLCALC 96 12/22/2020 08:28 AM    Wt Readings from Last 3 Encounters:  12/22/20 231 lb (104.8 kg)  09/15/20 233 lb (105.7 kg)  05/28/20 223 lb (101.2 kg)     Objective:    Vital Signs:  There were no vitals taken for this visit.   Physical Exam No physical exam due to telemedicine visit  ASSESSMENT & PLAN:     1. COVID-19 - molnupiravir EUA (LAGEVRIO) 200 mg CAPS capsule; Take 4 capsules (800 mg total) by mouth 2 (two) times daily for 5 days.  Dispense: 40  capsule; Refill: 0 - fluticasone (FLONASE) 50 MCG/ACT nasal spray; Place 2 sprays into both nostrils daily.  Dispense: 16 g; Refill: 6  2. Acute cough - POC COVID-19 BinaxNow-POSITIVE - promethazine-dextromethorphan (PROMETHAZINE-DM) 6.25-15 MG/5ML syrup; Take 5 mLs by mouth 4 (four) times daily as needed.  Dispense: 118 mL; Refill: 0    Rest and push fluids Treat symptoms with OTC medications Seek emergency medical care for severe or concerning symptoms Follow-up as needed    COVID-19 Education: The signs and symptoms of COVID-19 were discussed with the patient and how to seek care for testing (follow up with PCP or arrange E-visit). The importance of social distancing was discussed today.   I spent 10 minutes dedicated to the care of this patient on the date of this encounter to include face-to-face time with the patient, as well as: EMR review and prescription medication management.   Follow Up:  In Person prn  Signed,  Jerrell Belfast, DNP  03/15/2021 4:38 PM    [place]

## 2021-03-22 ENCOUNTER — Telehealth: Payer: Self-pay | Admitting: Orthopaedic Surgery

## 2021-03-22 NOTE — Telephone Encounter (Signed)
Received call from pt. She asking for dates of treatment for her attorney, I advised pt her first date of treatment and advised the atty can request the records from that date to the present. She is going to contact them with this information.

## 2021-03-23 ENCOUNTER — Other Ambulatory Visit: Payer: Self-pay | Admitting: Family Medicine

## 2021-03-23 DIAGNOSIS — I10 Essential (primary) hypertension: Secondary | ICD-10-CM

## 2021-03-25 ENCOUNTER — Ambulatory Visit: Payer: 59 | Admitting: Nurse Practitioner

## 2021-04-01 ENCOUNTER — Encounter: Payer: Self-pay | Admitting: Nurse Practitioner

## 2021-04-01 ENCOUNTER — Ambulatory Visit (INDEPENDENT_AMBULATORY_CARE_PROVIDER_SITE_OTHER): Payer: 59 | Admitting: Nurse Practitioner

## 2021-04-01 VITALS — BP 128/82 | HR 78 | Temp 97.8°F | Ht 63.0 in | Wt 235.0 lb

## 2021-04-01 DIAGNOSIS — R053 Chronic cough: Secondary | ICD-10-CM | POA: Diagnosis not present

## 2021-04-01 DIAGNOSIS — U071 COVID-19: Secondary | ICD-10-CM

## 2021-04-01 DIAGNOSIS — J1282 Pneumonia due to coronavirus disease 2019: Secondary | ICD-10-CM | POA: Diagnosis not present

## 2021-04-01 DIAGNOSIS — R0602 Shortness of breath: Secondary | ICD-10-CM | POA: Diagnosis not present

## 2021-04-01 MED ORDER — AZITHROMYCIN 250 MG PO TABS: ORAL_TABLET | ORAL | 0 refills | Status: AC

## 2021-04-01 MED ORDER — TRELEGY ELLIPTA 200-62.5-25 MCG/ACT IN AEPB: 1.0000 | INHALATION_SPRAY | Freq: Every day | RESPIRATORY_TRACT | 0 refills | Status: DC

## 2021-04-01 MED ORDER — ALBUTEROL SULFATE HFA 108 (90 BASE) MCG/ACT IN AERS: 2.0000 | INHALATION_SPRAY | Freq: Four times a day (QID) | RESPIRATORY_TRACT | 1 refills | Status: DC | PRN

## 2021-04-01 NOTE — Patient Instructions (Addendum)
Use Trelegy inhaler daily, rinse mouth afterwards ?Use Albuterol inhaler as needed for shortness of breath ?Take Z-pack as directed ?Push fluids, especially water ?Take Mucinex every 12 hours as needed ?Follow-up as needed ? ?Community-Acquired Pneumonia, Adult ?Pneumonia is an infection of the lungs. It causes irritation and swelling in the airways of the lungs. Mucus and fluid may also build up inside the airways. This may cause coughing and trouble breathing. ?One type of pneumonia can happen while you are in a hospital. A different type can happen when you are not in a hospital (community-acquired pneumonia). ?What are the causes? ?This condition is caused by germs (viruses, bacteria, or fungi). Some types of germs can spread from person to person. Pneumonia is not thought to spread from person to person. ?What increases the risk? ?You are more likely to develop this condition if: ?You have a long-term (chronic) disease, such as: ?Disease of the lungs. This may be chronic obstructive pulmonary disease (COPD) or asthma. ?Heart failure. ?Cystic fibrosis. ?Diabetes. ?Kidney disease. ?Sickle cell disease. ?HIV. ?You have other health problems, such as: ?Your body's defense system (immune system) is weak. ?A condition that may cause you to breathe in fluids from your mouth and nose. ?You had your spleen taken out. ?You do not take good care of your teeth and mouth (poor dental hygiene). ?You use or have used tobacco products. ?You travel where the germs that cause this illness are common. ?You are near certain animals or the places they live. ?You are older than 62 years of age. ?What are the signs or symptoms? ?Symptoms of this condition include: ?A cough. ?A fever. ?Sweating or chills. ?Chest pain, often when you breathe deeply or cough. ?Breathing problems, such as: ?Fast breathing. ?Trouble breathing. ?Shortness of breath. ?Feeling tired (fatigued). ?Muscle aches. ?How is this treated? ?Treatment for this  condition depends on many things, such as: ?The cause of your illness. ?Your medicines. ?Your other health problems. ?Most adults can be treated at home. Sometimes, treatment must happen in a hospital. ?Treatment may include medicines to kill germs. ?Medicines may depend on which germ caused your illness. ?Very bad pneumonia is rare. If you get it, you may: ?Have a machine to help you breathe. ?Have fluid taken away from around your lungs. ?Follow these instructions at home: ?Medicines ?Take over-the-counter and prescription medicines only as told by your doctor. ?Take cough medicine only if you are losing sleep. Cough medicine can keep your body from taking mucus away from your lungs. ?If you were prescribed an antibiotic medicine, take it as told by your doctor. Do not stop taking the antibiotic even if you start to feel better. ?Lifestyle ?  ?Do not drink alcohol. ?Do not use any products that contain nicotine or tobacco, such as cigarettes, e-cigarettes, and chewing tobacco. If you need help quitting, ask your doctor. ?Eat a healthy diet. This includes a lot of vegetables, fruits, whole grains, low-fat dairy products, and low-fat (lean) protein. ?General instructions ? ?Rest a lot. Sleep for at least 8 hours each night. ?Sleep with your head and neck raised. Put a few pillows under your head or sleep in a reclining chair. ?Return to your normal activities as told by your doctor. Ask your doctor what activities are safe for you. ?Drink enough fluid to keep your pee (urine) pale yellow. ?If your throat is sore, rinse your mouth often with salt water. To make salt water, dissolve ?-1 tsp (3-6 g) of salt in 1 cup (237 mL) of  warm water. ?Keep all follow-up visits as told by your doctor. This is important. ?How is this prevented? ?You can lower your risk of pneumonia by: ?Getting the pneumonia shot (vaccine). These shots have different types and schedules. Ask your doctor what works best for you. Think about getting  this shot if: ?You are older than 62 years of age. ?You are 60-75 years of age and: ?You are being treated for cancer. ?You have long-term lung disease. ?You have other problems that affect your body's defense system. Ask your doctor if you have one of these. ?Getting your flu shot every year. Ask your doctor which type of shot is best for you. ?Going to the dentist as often as told. ?Washing your hands often with soap and water for at least 20 seconds. If you cannot use soap and water, use hand sanitizer. ?Contact a doctor if: ?You have a fever. ?You lose sleep because your cough medicine does not help. ?Get help right away if: ?You are short of breath and this gets worse. ?You have more chest pain. ?Your sickness gets worse. This is very serious if: ?You are an older adult. ?Your body's defense system is weak. ?You cough up blood. ?These symptoms may be an emergency. Do not wait to see if the symptoms will go away. Get medical help right away. Call your local emergency services (911 in the [place] Do not drive yourself to the hospital. ?Summary ?Pneumonia is an infection of the lungs. ?Community-acquired pneumonia affects people who have not been in the hospital. Certain germs can cause this infection. ?This condition may be treated with medicines that kill germs. ?For very bad pneumonia, you may need a hospital stay and treatment to help with breathing. ?This information is not intended to replace advice given to you by your health care provider. Make sure you discuss any questions you have with your health care provider. ?Document Revised: 10/29/2018 Document Reviewed: 10/29/2018 ?Elsevier Patient Education ? [employer]. ? ?

## 2021-04-01 NOTE — Progress Notes (Signed)
Acute Office Visit  Subjective:    Patient ID: Danielle Jones, female    DOB: [birth year], 61 y.o.   MRN: [id number]  Chief Complaint  Patient presents with   Shortness of Breath   Cough    HPI: Patient is in today for cough and dyspnea. Onset was three weeks ago. She was diagnosed and treated with anti-virals for COVID-19 on 03/15/21. Reports she is experiencing back pain.Denies smoking history, fever, or orthopnea. She is interested in obtaining COVID-19 booster and T-dap.  Past Medical History:  Diagnosis Date   Anxiety    CMC arthritis    left thumb   Depression    Gout    High cholesterol    Hypertension    Hypothyroidism     Past Surgical History:  Procedure Laterality Date   ABDOMINAL HYSTERECTOMY     Carpal tunnel  2019   CARPOMETACARPEL SUSPENSION PLASTY Left 05/12/2020   Procedure: LEFT THUMB CARPOMETACARPAL ARTHROPLASTY;  Surgeon: Leandrew Koyanagi, MD;  Location: [facility];  Service: Orthopedics;  Laterality: Left;   thumb release  2019    Family History  Problem Relation Age of Onset   Arthritis Mother    Alzheimer's disease Mother    Kidney failure Mother    Congenital heart disease Father    Melanoma Father     Social History   Socioeconomic History   Marital status: Single    Spouse name: Not on file   Number of children: Not on file   Years of education: Not on file   Highest education level: Not on file  Occupational History   Not on file  Tobacco Use   Smoking status: Never   Smokeless tobacco: Never  Substance and Sexual Activity   Alcohol use: Yes    Comment: social   Drug use: No   Sexual activity: Not on file  Other Topics Concern   Not on file  Social History Narrative   Not on file   Social Determinants of Health   Financial Resource Strain: Not on file  Food Insecurity: Not on file  Transportation Needs: Not on file  Physical Activity: Not on file  Stress: Not on file  Social  Connections: Not on file  Intimate Partner Violence: Not on file    Outpatient Medications Prior to Visit  Medication Sig Dispense Refill   allopurinol (ZYLOPRIM) 100 MG tablet TAKE ONE TABLET BY MOUTH EVERY DAY 30 tablet 3   atorvastatin (LIPITOR) 40 MG tablet TAKE ONE TABLET BY MOUTH DAILY 90 tablet 1   ergocalciferol (VITAMIN D2) 1.25 MG (50000 UT) capsule TAKE 1 CAPSULE BY MOUTH 1 TIME A WEEK FOR 12 DOSES     fluticasone (FLONASE) 50 MCG/ACT nasal spray Place 2 sprays into both nostrils daily. 16 g 6   levothyroxine (SYNTHROID) 50 MCG tablet TAKE ONE TABLET BY MOUTH DAILY 90 tablet 1   meloxicam (MOBIC) 15 MG tablet TAKE 1/2 TABLET BY MOUTH TWICE DAILY AS NEEDED FOR PAIN 90 tablet 1   metFORMIN (GLUCOPHAGE) 500 MG tablet Take 1 tablet (500 mg total) by mouth 2 (two) times daily with a meal. 180 tablet 3   Olmesartan-amLODIPine-HCTZ 40-10-25 MG TABS TAKE ONE (1) TABLET BY MOUTH EVERY DAY 90 tablet 1   ondansetron (ZOFRAN) 4 MG tablet Take 1 tablet (4 mg total) by mouth every 8 (eight) hours as needed for nausea or vomiting. 30 tablet 1   promethazine (PHENERGAN) 25 MG tablet Take 1 tablet (25 mg  total) by mouth every 8 (eight) hours as needed for nausea or vomiting. 20 tablet 1   promethazine-dextromethorphan (PROMETHAZINE-DM) 6.25-15 MG/5ML syrup Take 5 mLs by mouth 4 (four) times daily as needed. 118 mL 0   Semaglutide, 1 MG/DOSE, 4 MG/3ML SOPN Inject 1 mg as directed once a week. 3 mL 0   Semaglutide,0.25 or 0.5MG/DOS, (OZEMPIC, 0.25 OR 0.5 MG/DOSE,) 2 MG/1.5ML SOPN Inject 0.5 mg into the skin once a week. 3 mL 0   Semaglutide,0.25 or 0.5MG/DOS, 2 MG/1.5ML SOPN Inject 0.25 mg into the skin once a week. Inject 0.25 mg into the skin once a week for four weeks;then inject 0.5 mg into the skin once a week for four weeks; then inject 1 mg into the skin once a week for four weeks 3 mL 0   venlafaxine XR (EFFEXOR-XR) 150 MG 24 hr capsule TAKE 1 CAPSULE BY MOUTH DAILY WITH BREAKFAST  90 capsule 1   Semaglutide-Weight Management 1.7 MG/0.75ML SOAJ Inject 1.7 mg into the skin once a week for 28 days. 3 mL 0   [START ON 04/17/2021] Semaglutide-Weight Management 2.4 MG/0.75ML SOAJ Inject 2.4 mg into the skin once a week for 28 days. 3 mL 0   No facility-administered medications prior to visit.    No Known Allergies  Review of Systems  Constitutional:  Positive for fatigue. Negative for appetite change, chills and fever.  HENT:  Negative for congestion, ear discharge, ear pain, rhinorrhea, sinus pressure, sneezing and sore throat.   Eyes:  Negative for visual disturbance.  Respiratory:  Positive for cough, shortness of breath and wheezing. Negative for chest tightness.   Cardiovascular:  Negative for chest pain, palpitations and leg swelling.  Gastrointestinal:  Negative for abdominal pain, diarrhea, nausea and vomiting.  Endocrine: Negative for polydipsia, polyphagia and polyuria.  Genitourinary:  Negative for difficulty urinating, dysuria, frequency, hematuria, menstrual problem, urgency, vaginal bleeding, vaginal discharge and vaginal pain.  Musculoskeletal:  Negative for back pain, gait problem, joint swelling, myalgias and neck pain.  Neurological:  Positive for light-headedness. Negative for dizziness, seizures, syncope, weakness, numbness and headaches.  Psychiatric/Behavioral:  Negative for agitation, confusion, hallucinations, sleep disturbance and suicidal ideas. The patient is not nervous/anxious.       Objective:    Physical Exam Vitals reviewed.  Constitutional:      Appearance: Normal appearance.  HENT:     Right Ear: Tympanic membrane normal.     Left Ear: Tympanic membrane normal.     Nose: Nose normal.  Cardiovascular:     Rate and Rhythm: Normal rate and regular rhythm.     Pulses: Normal pulses.     Heart sounds: Normal heart sounds.  Pulmonary:     Effort: Pulmonary effort is normal.     Breath sounds: Examination of the right-lower field  reveals decreased breath sounds. Examination of the left-lower field reveals decreased breath sounds. Decreased breath sounds present.  Abdominal:     General: Bowel sounds are normal.     Palpations: Abdomen is soft. There is no splenomegaly.  Skin:    General: Skin is warm and dry.     Capillary Refill: Capillary refill takes less than 2 seconds.  Neurological:     General: No focal deficit present.     Mental Status: She is alert and oriented to person, place, and time.  Psychiatric:        Mood and Affect: Mood normal.        Behavior: Behavior normal.    Ht  5' 3"  (1.6 m)    Wt 235 lb (106.6 kg)    BMI 41.63 kg/m  Wt Readings from Last 3 Encounters:  04/01/21 235 lb (106.6 kg)  12/22/20 231 lb (104.8 kg)  09/15/20 233 lb (105.7 kg)    Health Maintenance Due  Topic Date Due   HIV Screening  Never done   Hepatitis C Screening  Never done   TETANUS/TDAP  Never done   PAP SMEAR-Modifier  Never done   COVID-19 Vaccine (3 - Pfizer risk series) 08/15/2019   MAMMOGRAM  02/05/2021    There are no preventive care reminders to display for this patient.   Lab Results  Component Value Date   TSH 2.470 09/15/2020   Lab Results  Component Value Date   WBC 6.0 12/22/2020   HGB 12.4 12/22/2020   HCT 36.8 12/22/2020   MCV 86 12/22/2020   PLT 304 12/22/2020   Lab Results  Component Value Date   NA 141 12/22/2020   K 3.8 12/22/2020   CO2 28 12/22/2020   GLUCOSE 110 (H) 12/22/2020   BUN 15 12/22/2020   CREATININE 1.04 (H) 12/22/2020   BILITOT 0.5 12/22/2020   ALKPHOS 114 12/22/2020   AST 14 12/22/2020   ALT 16 12/22/2020   PROT 7.1 12/22/2020   ALBUMIN 4.7 12/22/2020   CALCIUM 9.9 12/22/2020   ANIONGAP 8 05/10/2020   EGFR 61 12/22/2020   Lab Results  Component Value Date   CHOL 166 12/22/2020   Lab Results  Component Value Date   HDL 47 12/22/2020   Lab Results  Component Value Date   LDLCALC 96 12/22/2020   Lab Results  Component Value Date   TRIG  131 12/22/2020   Lab Results  Component Value Date   CHOLHDL 3.5 12/22/2020   Lab Results  Component Value Date   HGBA1C 6.2 (H) 12/22/2020       Assessment & Plan:   1. Pneumonia due to COVID-19 virus - azithromycin (ZITHROMAX) 250 MG tablet; Take 2 tablets on day 1, then 1 tablet daily on days 2 through 5  Dispense: 6 tablet; Refill: 0 - Fluticasone-Umeclidin-Vilant (TRELEGY ELLIPTA) 200-62.5-25 MCG/ACT AEPB; Inhale 1 puff into the lungs daily.  Dispense: 2 each; Refill: 0 - albuterol (VENTOLIN HFA) 108 (90 Base) MCG/ACT inhaler; Inhale 2 puffs into the lungs every 6 (six) hours as needed for wheezing or shortness of breath.  Dispense: 8 g; Refill: 1  2. COVID-19 - azithromycin (ZITHROMAX) 250 MG tablet; Take 2 tablets on day 1, then 1 tablet daily on days 2 through 5  Dispense: 6 tablet; Refill: 0  3. Chronic cough - azithromycin (ZITHROMAX) 250 MG tablet; Take 2 tablets on day 1, then 1 tablet daily on days 2 through 5  Dispense: 6 tablet; Refill: 0 - Fluticasone-Umeclidin-Vilant (TRELEGY ELLIPTA) 200-62.5-25 MCG/ACT AEPB; Inhale 1 puff into the lungs daily.  Dispense: 2 each; Refill: 0 - albuterol (VENTOLIN HFA) 108 (90 Base) MCG/ACT inhaler; Inhale 2 puffs into the lungs every 6 (six) hours as needed for wheezing or shortness of breath.  Dispense: 8 g; Refill: 1  4. Shortness of breath - azithromycin (ZITHROMAX) 250 MG tablet; Take 2 tablets on day 1, then 1 tablet daily on days 2 through 5  Dispense: 6 tablet; Refill: 0 - Fluticasone-Umeclidin-Vilant (TRELEGY ELLIPTA) 200-62.5-25 MCG/ACT AEPB; Inhale 1 puff into the lungs daily.  Dispense: 2 each; Refill: 0 - albuterol (VENTOLIN HFA) 108 (90 Base) MCG/ACT inhaler; Inhale 2 puffs into the lungs every  6 (six) hours as needed for wheezing or shortness of breath.  Dispense: 8 g; Refill: 1       Use Trelegy inhaler daily, rinse mouth afterwards Use Albuterol inhaler as needed for shortness of breath Take Z-pack as  directed Push fluids, especially water Take Mucinex every 12 hours as needed Follow-up as needed  Follow-up: PRN  An After Visit Summary was printed and given to the patient.  I, Rip Harbour, NP, have reviewed all documentation for this visit. The documentation on 04/01/21 for the exam, diagnosis, procedures, and orders are all accurate and complete.    Signed, Rip Harbour, NP [place] [phone]

## 2021-04-05 ENCOUNTER — Other Ambulatory Visit: Payer: Self-pay | Admitting: Nurse Practitioner

## 2021-04-05 DIAGNOSIS — R051 Acute cough: Secondary | ICD-10-CM

## 2021-05-20 ENCOUNTER — Other Ambulatory Visit: Payer: Self-pay | Admitting: Nurse Practitioner

## 2021-05-20 DIAGNOSIS — M109 Gout, unspecified: Secondary | ICD-10-CM

## 2021-05-30 ENCOUNTER — Other Ambulatory Visit: Payer: Self-pay | Admitting: Nurse Practitioner

## 2021-06-10 ENCOUNTER — Telehealth: Payer: Self-pay

## 2021-06-10 ENCOUNTER — Ambulatory Visit: Payer: 59 | Admitting: Orthopaedic Surgery

## 2021-06-10 ENCOUNTER — Encounter: Payer: Self-pay | Admitting: Orthopaedic Surgery

## 2021-06-10 DIAGNOSIS — M79642 Pain in left hand: Secondary | ICD-10-CM | POA: Diagnosis not present

## 2021-06-10 MED ORDER — PREDNISONE 10 MG (21) PO TBPK: ORAL_TABLET | ORAL | 3 refills | Status: DC

## 2021-06-10 NOTE — Telephone Encounter (Signed)
Patient needs to come in for nurse visit to have Arthritis Labs Drawn. Incorrect tube was taken today. ?

## 2021-06-10 NOTE — Progress Notes (Signed)
? ?Office Visit Note ?  ?Patient: Danielle Jones           ?Date of Birth: November 26, 1960           ?MRN: [id number] ?Visit Date: 06/10/2021 ?             ?Requested by: Rip Harbour, NP ?[address] ?[place],  [place] [address] ?PCP: Rip Harbour, NP ? ? ?Assessment & Plan: ?Visit Diagnoses:  ?1. Pain in left hand   ? ? ?Plan: Impression is left hand and wrist pain and swelling.  My sense is that she is got an underlying inflammatory process going on.  She has had a history of gout.  We will obtain arthritis panel today.  We will place her on a prednisone Dosepak.  I will be in touch about the lab results. ? ?Follow-Up Instructions: No follow-ups on file.  ? ?Orders:  ?Orders Placed This Encounter  ?Procedures  ? Uric acid  ? Antinuclear Antib (ANA)  ? Sed Rate (ESR)  ? Rheumatoid Factor  ? ?Meds ordered this encounter  ?Medications  ? predniSONE (STERAPRED UNI-PAK 21 TAB) 10 MG (21) TBPK tablet  ?  Sig: Take as directed  ?  Dispense:  21 tablet  ?  Refill:  3  ? ? ? ? Procedures: ?No procedures performed ? ? ?Clinical Data: ?No additional findings. ? ? ?Subjective: ?Chief Complaint  ?Patient presents with  ? Left Hand - Pain  ? ? ?HPI ? ?[name] comes in today for evaluation of left hand and wrist pain.  Endorses diffuse swelling and pain of the wrist and the fingers and hand. ? ?Review of Systems  ?Constitutional: Negative.   ?HENT: Negative.    ?Eyes: Negative.   ?Respiratory: Negative.    ?Cardiovascular: Negative.   ?Endocrine: Negative.   ?Musculoskeletal: Negative.   ?Neurological: Negative.   ?Hematological: Negative.   ?Psychiatric/Behavioral: Negative.    ?All other systems reviewed and are negative. ? ? ?Objective: ?Vital Signs: There were no vitals taken for this visit. ? ?Physical Exam ?Vitals and nursing note reviewed.  ?Constitutional:   ?   Appearance: She is well-developed.  ?Pulmonary:  ?   Effort: Pulmonary effort is normal.  ?Skin: ?   General: Skin is warm.  ?   Capillary Refill:  Capillary refill takes less than 2 seconds.  ?Neurological:  ?   Mental Status: She is alert and oriented to person, place, and time.  ?Psychiatric:     ?   Behavior: Behavior normal.     ?   Thought Content: Thought content normal.     ?   Judgment: Judgment normal.  ? ? ?Ortho Exam ? ?Examination of the left hand and wrist show fully healed surgical scar.  There is no cellulitis or warmth.  She does have diffuse swelling throughout the hand fingers and wrist.  Soft tissues are tender to touch.  No neurovascular compromise.  Brisk capillary refill. ? ?Specialty Comments:  ?No specialty comments available. ? ?Imaging: ?No results found. ? ? ?PMFS History: ?Patient Active Problem List  ? Diagnosis Date Noted  ? Gout 02/05/2020  ? Secondary hypertension 02/05/2020  ? Vitamin B deficiency 02/05/2020  ? Hypothyroidism 02/05/2020  ? Anxiety 02/05/2020  ? Depression 02/05/2020  ? Hand pain, left 02/05/2020  ? Family history of malignant melanoma 02/05/2020  ? Elevated hemoglobin A1c 01/02/2019  ? Encounter for vitamin deficiency screening 01/02/2019  ? Low vitamin B12 level 01/02/2019  ?  Primary osteoarthritis of first carpometacarpal joint of left hand 09/25/2018  ? GAD (generalized anxiety disorder) 04/10/2018  ? Hyperlipidemia 04/10/2018  ? Carpal tunnel syndrome of left wrist 08/17/2017  ? ?Past Medical History:  ?Diagnosis Date  ? Anxiety   ? CMC arthritis   ? left thumb  ? Depression   ? Gout   ? High cholesterol   ? Hypertension   ? Hypothyroidism   ?  ?Family History  ?Problem Relation Age of Onset  ? Arthritis Mother   ? Alzheimer's disease Mother   ? Kidney failure Mother   ? Congenital heart disease Father   ? Melanoma Father   ?  ?Past Surgical History:  ?Procedure Laterality Date  ? ABDOMINAL HYSTERECTOMY    ? Carpal tunnel  2019  ? CARPOMETACARPEL SUSPENSION PLASTY Left 05/12/2020  ? Procedure: LEFT THUMB CARPOMETACARPAL ARTHROPLASTY;  Surgeon: Leandrew Koyanagi, MD;  Location: [facility];   Service: Orthopedics;  Laterality: Left;  ? thumb release  2019  ? ?Social History  ? ?Occupational History  ? Not on file  ?Tobacco Use  ? Smoking status: Never  ? Smokeless tobacco: Never  ?Substance and Sexual Activity  ? Alcohol use: Yes  ?  Comment: social  ? Drug use: No  ? Sexual activity: Not on file  ? ? ? ? ? ? ?

## 2021-06-13 ENCOUNTER — Other Ambulatory Visit: Payer: Self-pay

## 2021-06-13 DIAGNOSIS — M79642 Pain in left hand: Secondary | ICD-10-CM

## 2021-06-13 LAB — ANA: Anti Nuclear Antibody (ANA): POSITIVE — AB

## 2021-06-13 LAB — ANTI-NUCLEAR AB-TITER (ANA TITER): ANA Titer 1: 1:40 {titer} — ABNORMAL HIGH

## 2021-06-13 LAB — RHEUMATOID FACTOR: Rheumatoid fact SerPl-aCnc: <14 IU/mL (ref <14)

## 2021-06-13 LAB — SEDIMENTATION RATE: Sed Rate: 14 mm/h (ref 0–30)

## 2021-06-13 LAB — URIC ACID: Uric Acid, Serum: 6.2 mg/dL (ref 2.5–7.0)

## 2021-06-13 NOTE — Progress Notes (Signed)
Needs referral to rheumatology.  Thanks.

## 2021-06-20 ENCOUNTER — Telehealth: Payer: Self-pay | Admitting: Orthopaedic Surgery

## 2021-06-20 NOTE — Telephone Encounter (Signed)
Pt called requesting a call back concerning her swollen hand. She states to need medical advice. Pt phone number is [phone].

## 2021-06-20 NOTE — Telephone Encounter (Signed)
Tried to call patient. No answer. Left message on machine to call us back.

## 2021-06-20 NOTE — Telephone Encounter (Signed)
Spoke with patient. She said that she has been experiencing swelling in her hand again. I explained that she had labwork and Dr.Xu had referred her to a rheumatologist (Dr.Rice). In the referral notes it states that they tried to call and schedule her but she hung up on them. I relayed the information to patient, and she agreed. I gave her Dr.Rice's number and she will call them to schedule.

## 2021-08-05 ENCOUNTER — Other Ambulatory Visit: Payer: Self-pay | Admitting: Nurse Practitioner

## 2021-09-15 ENCOUNTER — Telehealth: Payer: Self-pay | Admitting: Orthopaedic Surgery

## 2021-09-15 NOTE — Telephone Encounter (Signed)
Received vm from Baron with [employer] checking status of a request for records. IC,lmvm advised we have not received their request, to please re-fax and left both our fax numbers [phone]

## 2021-09-22 NOTE — Progress Notes (Unsigned)
Office Visit Note  Patient: Danielle Jones             Date of Birth: 1960/06/07           MRN: [id number]             PCP: Rip Harbour, NP Referring: Leandrew Koyanagi, MD Visit Date: 09/23/2021 Occupation: Previously sewer  Subjective:  New Patient (Initial Visit) (Left wrist and thumb pain and arthritis. Toe pain and occasional swelling. )   History of Present Illness: Danielle Jones is a 61 y.o. female here for evaluation of joint pain in multiple areas especially left hand and arm with some swelling and joint stiffness. Labs checked in orthopedics clinic evaluation with low positive ANA. 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  Increased problems with pain and swelling at her left wrist and thumb have been going on since around the beginning of this year.  She sees swelling that worsens during the course of the day on almost every day is more increased based on how much activity she exerts herself.  She does not see any definite joint swelling outside of the hand.  Does get some swelling accumulating around the ankles of both legs by the end of the day this is not particularly painful.  She is also noticed some gradual nodule developing at her left great toe with slight toe deviation she was started on allopurinol or almost 2 years suspected as gout she is not sure if she ever had a true gout flare in the foot.  Labs reviewed 05/2021 ANA 1:40 speckled RF neg ESR 14 Uric acid 6.2  Activities of Daily Living:  Patient reports morning stiffness for 30 minutes.   Patient Reports nocturnal pain.  Difficulty dressing/grooming: Denies Difficulty climbing stairs: Denies Difficulty getting out of chair: Denies Difficulty using hands for taps, buttons, cutlery, and/or writing: Reports  Review of Systems  Constitutional:  Positive for fatigue.  HENT:  Negative for mouth sores and mouth dryness.   Eyes:  Negative for dryness.  Respiratory:   Negative for shortness of breath.   Cardiovascular:  Negative for chest pain and palpitations.  Gastrointestinal:  Negative for blood in stool, constipation and diarrhea.  Endocrine: Negative for increased urination.  Genitourinary:  Negative for involuntary urination.  Musculoskeletal:  Positive for joint pain, joint pain, joint swelling, myalgias, muscle weakness, morning stiffness, muscle tenderness and myalgias. Negative for gait problem.  Skin:  Negative for color change, rash, hair loss and sensitivity to sunlight.  Allergic/Immunologic: Negative for susceptible to infections.  Neurological:  Positive for headaches. Negative for dizziness.  Hematological:  Negative for swollen glands.  Psychiatric/Behavioral:  Positive for depressed mood and sleep disturbance. The patient is nervous/anxious.     PMFS History:  Patient Active Problem List   Diagnosis Date Noted   Positive ANA (antinuclear antibody) 09/23/2021   Pain and swelling of left wrist 09/23/2021   Bunion of great toe of left foot 09/23/2021   Gout 02/05/2020   Secondary hypertension 02/05/2020   Vitamin B deficiency 02/05/2020   Hypothyroidism 02/05/2020   Anxiety 02/05/2020   Depression 02/05/2020   Hand pain, left 02/05/2020   Family history of malignant melanoma 02/05/2020   Elevated hemoglobin A1c 01/02/2019   Encounter for vitamin deficiency screening 01/02/2019   Low vitamin B12 level 01/02/2019   Primary osteoarthritis of first carpometacarpal joint of left hand 09/25/2018   GAD (generalized anxiety disorder) 04/10/2018   Hyperlipidemia 04/10/2018  Carpal tunnel syndrome of left wrist 08/17/2017    Past Medical History:  Diagnosis Date   Anxiety    CMC arthritis    left thumb   Depression    Gout    High cholesterol    Hypertension    Hypothyroidism     Family History  Problem Relation Age of Onset   Arthritis Mother    Alzheimer's disease Mother    Kidney failure Mother    Congenital heart  disease Father    Melanoma Father    Healthy Son    Healthy Son    Blindness Son    Mental illness Son    Past Surgical History:  Procedure Laterality Date   ABDOMINAL HYSTERECTOMY     Carpal tunnel  2019   CARPOMETACARPEL SUSPENSION PLASTY Left 05/12/2020   Procedure: LEFT THUMB CARPOMETACARPAL ARTHROPLASTY;  Surgeon: Leandrew Koyanagi, MD;  Location: [facility];  Service: Orthopedics;  Laterality: Left;   thumb release  2019   Social History   Social History Narrative   Not on file   Immunization History  Administered Date(s) Administered   Influenza Inj Mdck Quad Pf 01/18/2021   Influenza Split 09/30/2017   Influenza,inj,Quad PF,6-35 Mos 10/21/2018, 01/06/2020   Influenza-Unspecified 09/30/2017   PFIZER(Purple Top)SARS-COV-2 Vaccination 06/26/2019, 07/18/2019   Pneumococcal Polysaccharide-23 01/02/2019   Zoster Recombinat (Shingrix) 09/16/2020, 12/22/2020     Objective: Vital Signs: BP 129/78 (BP Location: Right Arm, Patient Position: Sitting, Cuff Size: Large)   Pulse 69   Resp 14   Ht 5' 2.5" (1.588 m)   Wt 233 lb (105.7 kg)   BMI 41.94 kg/m    Physical Exam Constitutional:      Appearance: She is obese.  HENT:     Mouth/Throat:     Mouth: Mucous membranes are moist.     Pharynx: Oropharynx is clear.  Eyes:     Conjunctiva/sclera: Conjunctivae normal.  Cardiovascular:     Rate and Rhythm: Normal rate and regular rhythm.  Pulmonary:     Effort: Pulmonary effort is normal.     Breath sounds: Normal breath sounds.  Musculoskeletal:     Right lower leg: No edema.     Left lower leg: No edema.  Lymphadenopathy:     Cervical: No cervical adenopathy.  Skin:    General: Skin is warm and dry.     Findings: No rash.  Neurological:     Mental Status: She is alert.  Psychiatric:        Mood and Affect: Mood normal.      Musculoskeletal Exam:  Shoulders full ROM no tenderness or swelling Elbows full ROM no tenderness or swelling Left wrist  with slightly restricted range of motion and well-healed surgical scar, no palpable effusion There postsurgical changes at the left first [facility] joint also appears to have soft tissue swelling on the dorsal side of the hand overlying this joint with some tenderness to pressure, second digit with tenderness to pressure and there is interosseous muscle wasting with no palpable flexor or extensor tendon nodules, decreased flexion range of motion in the second digit Knees full ROM no tenderness or swelling Ankles full ROM no tenderness or swelling Pes planus deformity with some flattening of the transverse arch Right MTPs appear normal, left first MTP with small bony nodule on the medial border there is very slight lateral deviation   Investigation: No additional findings.  Imaging: No results found.  Recent Labs: Lab Results  Component Value Date  WBC 6.0 12/22/2020   HGB 12.4 12/22/2020   PLT 304 12/22/2020   NA 141 12/22/2020   K 3.8 12/22/2020   CL 99 12/22/2020   CO2 28 12/22/2020   GLUCOSE 110 (H) 12/22/2020   BUN 15 12/22/2020   CREATININE 1.04 (H) 12/22/2020   BILITOT 0.5 12/22/2020   ALKPHOS 114 12/22/2020   AST 14 12/22/2020   ALT 16 12/22/2020   PROT 7.1 12/22/2020   ALBUMIN 4.7 12/22/2020   CALCIUM 9.9 12/22/2020   GFRAA 68 02/05/2020    Speciality Comments: No specialty comments available.  Procedures:  No procedures performed Allergies: Patient has no known allergies.   Assessment / Plan:     Visit Diagnoses: Positive ANA (antinuclear antibody) - Plan: Sedimentation rate, RNP Antibody, Anti-Smith antibody, Sjogrens syndrome-A extractable nuclear antibody, Anti-DNA antibody, double-stranded, C3 and C4  Positive ANA no systemic symptoms of connective tissue disease outside of arthritis. Checking specific antibody markers RNP, SM, SSA, dsDNA, and serum complement. Also checking sedimentation rate.  Pain and swelling of left wrist  On examination swelling is  localized at the radial side of wrist and overlying thumb where she has extensive arthritis and previous surgeries. I do not feel any focal joint synovitis. Ultrasound inspection looks suggestive for an organized swelling possibly complex cyst.  Bunion of great toe of left foot Gout, unspecified cause, unspecified chronicity, unspecified site - Plan: Uric acid  I am not sure her symptoms truly represent gout. No characteristic episodes of podagra, current MTP changes with bunion no tophus or tenderness. Checking uric acid if not elevated would recommend trying to stop allopurinol.  Orders: Orders Placed This Encounter  Procedures   Sedimentation rate   RNP Antibody   Anti-Smith antibody   Sjogrens syndrome-A extractable nuclear antibody   Anti-DNA antibody, double-stranded   C3 and C4   Uric acid   No orders of the defined types were placed in this encounter.    Follow-Up Instructions: No follow-ups on file.   Collier Salina, MD  Note - This record has been created using [employer].  Chart creation errors have been sought, but may not always  have been located. Such creation errors do not reflect on  the standard of medical care.

## 2021-09-23 ENCOUNTER — Ambulatory Visit: Payer: 59 | Attending: Internal Medicine | Admitting: Internal Medicine

## 2021-09-23 ENCOUNTER — Encounter: Payer: Self-pay | Admitting: Internal Medicine

## 2021-09-23 ENCOUNTER — Ambulatory Visit (INDEPENDENT_AMBULATORY_CARE_PROVIDER_SITE_OTHER): Payer: 59

## 2021-09-23 VITALS — BP 129/78 | HR 69 | Resp 14 | Ht 62.5 in | Wt 233.0 lb

## 2021-09-23 DIAGNOSIS — M21612 Bunion of left foot: Secondary | ICD-10-CM

## 2021-09-23 DIAGNOSIS — M109 Gout, unspecified: Secondary | ICD-10-CM

## 2021-09-23 DIAGNOSIS — M25532 Pain in left wrist: Secondary | ICD-10-CM | POA: Diagnosis not present

## 2021-09-23 DIAGNOSIS — M25432 Effusion, left wrist: Secondary | ICD-10-CM

## 2021-09-23 DIAGNOSIS — R768 Other specified abnormal immunological findings in serum: Secondary | ICD-10-CM | POA: Diagnosis not present

## 2021-09-24 LAB — SEDIMENTATION RATE: Sed Rate: 14 mm/h (ref 0–30)

## 2021-09-24 LAB — ANTI-SMITH ANTIBODY: ENA SM Ab Ser-aCnc: <1 AI

## 2021-09-24 LAB — C3 AND C4
C3 Complement: 185 mg/dL (ref 83–193)
C4 Complement: 33 mg/dL (ref 15–57)

## 2021-09-24 LAB — URIC ACID: Uric Acid, Serum: 7 mg/dL (ref 2.5–7.0)

## 2021-09-24 LAB — ANTI-DNA ANTIBODY, DOUBLE-STRANDED: ds DNA Ab: <1 IU/mL

## 2021-09-24 LAB — SJOGRENS SYNDROME-A EXTRACTABLE NUCLEAR ANTIBODY: SSA (Ro) (ENA) Antibody, IgG: <1 AI

## 2021-09-24 LAB — RNP ANTIBODY: Ribonucleic Protein(ENA) Antibody, IgG: <1 AI

## 2021-10-07 NOTE — Progress Notes (Deleted)
Office Visit Note  Patient: Danielle Jones             Date of Birth: [birth year]           MRN: [id number]             PCP: Rip Harbour, NP Referring: Rip Harbour, NP Visit Date: 10/19/2021   Subjective:  No chief complaint on file.   History of Present Illness: Danielle Jones is a 61 y.o. female here for follow up  for evaluation of joint pain in multiple areas especially left hand and arm with some swelling and joint stiffness   Previous HPI 09/23/2021  Danielle Jones is a 61 y.o. female here for evaluation of joint pain in multiple areas especially left hand and arm with some swelling and joint stiffness. Labs checked in orthopedics clinic evaluation with low positive ANA. 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  Increased problems with pain and swelling at her left wrist and thumb have been going on since around the beginning of this year.  She sees swelling that worsens during the course of the day on almost every day is more increased based on how much activity she exerts herself.  She does not see any definite joint swelling outside of the hand.  Does get some swelling accumulating around the ankles of both legs by the end of the day this is not particularly painful.  She is also noticed some gradual nodule developing at her left great toe with slight toe deviation she was started on allopurinol or almost 2 years suspected as gout she is not sure if she ever had a true gout flare in the foot.   Labs reviewed 05/2021 ANA 1:40 speckled RF neg ESR 14 Uric acid 6.2   Activities of Daily Living:  Patient reports morning stiffness for 30 minutes.   Patient Reports nocturnal pain.  Difficulty dressing/grooming: Denies Difficulty climbing stairs: Denies Difficulty getting out of chair: Denies Difficulty using hands for taps, buttons, cutlery, and/or writing: Reports   No Rheumatology ROS completed.   PMFS History:  Patient  Active Problem List   Diagnosis Date Noted   Positive ANA (antinuclear antibody) 09/23/2021   Pain and swelling of left wrist 09/23/2021   Bunion of great toe of left foot 09/23/2021   Gout 02/05/2020   Secondary hypertension 02/05/2020   Vitamin B deficiency 02/05/2020   Hypothyroidism 02/05/2020   Anxiety 02/05/2020   Depression 02/05/2020   Hand pain, left 02/05/2020   Family history of malignant melanoma 02/05/2020   Elevated hemoglobin A1c 01/02/2019   Encounter for vitamin deficiency screening 01/02/2019   Low vitamin B12 level 01/02/2019   Primary osteoarthritis of first carpometacarpal joint of left hand 09/25/2018   GAD (generalized anxiety disorder) 04/10/2018   Hyperlipidemia 04/10/2018   Carpal tunnel syndrome of left wrist 08/17/2017    Past Medical History:  Diagnosis Date   Anxiety    CMC arthritis    left thumb   Depression    Gout    High cholesterol    Hypertension    Hypothyroidism     Family History  Problem Relation Age of Onset   Arthritis Mother    Alzheimer's disease Mother    Kidney failure Mother    Congenital heart disease Father    Melanoma Father    Healthy Son    Healthy Son    Blindness Son    Mental illness Son  Past Surgical History:  Procedure Laterality Date   ABDOMINAL HYSTERECTOMY     Carpal tunnel  2019   CARPOMETACARPEL SUSPENSION PLASTY Left 05/12/2020   Procedure: LEFT THUMB CARPOMETACARPAL ARTHROPLASTY;  Surgeon: Leandrew Koyanagi, MD;  Location: [facility];  Service: Orthopedics;  Laterality: Left;   thumb release  2019   Social History   Social History Narrative   Not on file   Immunization History  Administered Date(s) Administered   Influenza Inj Mdck Quad Pf 01/18/2021   Influenza Split 09/30/2017   Influenza,inj,Quad PF,6-35 Mos 10/21/2018, 01/06/2020   Influenza-Unspecified 09/30/2017   PFIZER(Purple Top)SARS-COV-2 Vaccination 06/26/2019, 07/18/2019   Pneumococcal Polysaccharide-23 01/02/2019    Zoster Recombinat (Shingrix) 09/16/2020, 12/22/2020     Objective: Vital Signs: There were no vitals taken for this visit.   Physical Exam   Musculoskeletal Exam: ***  CDAI Exam: CDAI Score: -- Patient Global: --; Provider Global: -- Swollen: --; Tender: -- Joint Exam 10/19/2021   No joint exam has been documented for this visit   There is currently no information documented on the homunculus. Go to the Rheumatology activity and complete the homunculus joint exam.  Investigation: No additional findings.  Imaging: XR Foot 2 Views Left  Result Date: 09/23/2021 Xray left foot 2 views Tibiotalar joint space appears normal. Plantar calcaneal enthesophyte present. No significant degenerative arthritis in midfoot and toes. Small lateral displacement of 1st toe without large osteophyte formation. No erosions or abnormal calcification seen.   Recent Labs: Lab Results  Component Value Date   WBC 6.0 12/22/2020   HGB 12.4 12/22/2020   PLT 304 12/22/2020   NA 141 12/22/2020   K 3.8 12/22/2020   CL 99 12/22/2020   CO2 28 12/22/2020   GLUCOSE 110 (H) 12/22/2020   BUN 15 12/22/2020   CREATININE 1.04 (H) 12/22/2020   BILITOT 0.5 12/22/2020   ALKPHOS 114 12/22/2020   AST 14 12/22/2020   ALT 16 12/22/2020   PROT 7.1 12/22/2020   ALBUMIN 4.7 12/22/2020   CALCIUM 9.9 12/22/2020   GFRAA 68 02/05/2020    Speciality Comments: No specialty comments available.  Procedures:  No procedures performed Allergies: Patient has no known allergies.   Assessment / Plan:     Visit Diagnoses: No diagnosis found.  ***  Orders: No orders of the defined types were placed in this encounter.  No orders of the defined types were placed in this encounter.    Follow-Up Instructions: No follow-ups on file.   Bertram Savin, RT  Note - This record has been created using [employer].  Chart creation errors have been sought, but may not always  have been located. Such creation  errors do not reflect on  the standard of medical care.

## 2021-10-17 ENCOUNTER — Other Ambulatory Visit: Payer: Self-pay

## 2021-10-17 DIAGNOSIS — I10 Essential (primary) hypertension: Secondary | ICD-10-CM

## 2021-10-17 MED ORDER — OLMESARTAN-AMLODIPINE-HCTZ 40-10-25 MG PO TABS: ORAL_TABLET | ORAL | 1 refills | Status: DC

## 2021-10-19 ENCOUNTER — Ambulatory Visit: Payer: 59 | Admitting: Internal Medicine

## 2021-10-19 DIAGNOSIS — M109 Gout, unspecified: Secondary | ICD-10-CM

## 2021-10-19 DIAGNOSIS — R768 Other specified abnormal immunological findings in serum: Secondary | ICD-10-CM

## 2021-10-19 DIAGNOSIS — M21612 Bunion of left foot: Secondary | ICD-10-CM

## 2021-10-19 DIAGNOSIS — M25532 Pain in left wrist: Secondary | ICD-10-CM

## 2021-11-11 ENCOUNTER — Other Ambulatory Visit: Payer: Self-pay

## 2021-11-11 DIAGNOSIS — M109 Gout, unspecified: Secondary | ICD-10-CM

## 2021-11-11 MED ORDER — VENLAFAXINE HCL ER 150 MG PO CP24: 150.0000 mg | ORAL_CAPSULE | Freq: Every day | ORAL | 0 refills | Status: DC

## 2021-11-11 MED ORDER — ALLOPURINOL 100 MG PO TABS: 100.0000 mg | ORAL_TABLET | Freq: Every day | ORAL | 0 refills | Status: DC

## 2021-11-15 ENCOUNTER — Other Ambulatory Visit: Payer: Self-pay

## 2021-11-15 DIAGNOSIS — I10 Essential (primary) hypertension: Secondary | ICD-10-CM

## 2021-11-15 DIAGNOSIS — R7303 Prediabetes: Secondary | ICD-10-CM

## 2021-11-15 DIAGNOSIS — R7301 Impaired fasting glucose: Secondary | ICD-10-CM

## 2021-11-16 ENCOUNTER — Ambulatory Visit: Payer: Self-pay | Admitting: Nurse Practitioner

## 2021-11-16 NOTE — Progress Notes (Deleted)
   Established Patient Office Visit  Subjective   Patient ID: Danielle Jones, female    DOB: Aug 09, 1960  Age: 61 y.o. MRN: [id number]  CC: Prediabetes Hyperlipidemia Hypothyroidism  HPI Prediabetes, Follow-up  Lab Results  Component Value Date   HGBA1C 6.2 (H) 12/22/2020   HGBA1C 6.1 (H) 09/15/2020   GLUCOSE 110 (H) 12/22/2020   GLUCOSE 114 (H) 09/15/2020   GLUCOSE 98 05/10/2020    Last seen for for this{1-12:18279} {days/wks/mos/yrs:310907} ago.  Management since that visit includes ***. Current symptoms include {Symptoms; diabetes:14075} and have been {Desc; course:15616}.  Prior visit with dietician: {yes/no:17258} Current diet: {diet habits:16563} Current exercise: {exercise types:16438}  Pertinent Labs:    Component Value Date/Time   CHOL 166 12/22/2020 0828   TRIG 131 12/22/2020 0828   CHOLHDL 3.5 12/22/2020 0828   CREATININE 1.04 (H) 12/22/2020 0828    Wt Readings from Last 3 Encounters:  09/23/21 233 lb (105.7 kg)  04/01/21 235 lb (106.6 kg)  12/22/20 231 lb (104.8 kg)  Lipid/Cholesterol, Follow-up  Last lipid panel Other pertinent labs  Lab Results  Component Value Date   CHOL 166 12/22/2020   HDL 47 12/22/2020   LDLCALC 96 12/22/2020   TRIG 131 12/22/2020   CHOLHDL 3.5 12/22/2020   Lab Results  Component Value Date   ALT 16 12/22/2020   AST 14 12/22/2020   PLT 304 12/22/2020   TSH 2.470 09/15/2020     She was last seen for this {1-12:18279} {days/wks/mos/yrs:310907} ago.  Management includes ***.  She reports {excellent/good/fair/poor:19665} compliance with treatment. She {is/is not:9024} having side effects. {document side effects if present:1}  Current diet: {diet habits:16563} Current exercise: {exercise types:16438}  The 10-year ASCVD risk score (Arnett DK, et al., 2019) is: 4%  Depression, Follow-up  She  was last seen for this {NUMBERS 1-12:18279} {days/wks/mos/yrs:310907} ago. Current treatment includes ***.   She reports  {excellent/good/fair/poor:19665} compliance with treatment. She {is/is not:21021397} having side effects. ***  She reports {DESC; GOOD/FAIR/POOR:18685} tolerance of treatment. Current symptoms include: {Symptoms; depression:1002} She feels she is {improved/worse/unchanged:3041574} since last visit.   *needs PHQ-9 and GAD-7*     04/01/2021    9:03 AM 09/15/2020    9:08 AM 03/18/2020    8:50 AM  Depression screen PHQ 2/9  Decreased Interest '2 1 2  '$ Down, Depressed, Hopeless '2 1 3  '$ PHQ - 2 Score '4 2 5  '$ Altered sleeping '3 3 1  '$ Tired, decreased energy '3 3 3  '$ Change in appetite '3 3 1  '$ Feeling bad or failure about yourself  0 3 2  Trouble concentrating '2 3 2  '$ Moving slowly or fidgety/restless 1 0 1  Suicidal thoughts 0 0 1  PHQ-9 Score '16 17 16  '$ Difficult doing work/chores  Somewhat difficult Not difficult at all      ROS    Objective:     There were no vitals taken for this visit. {Vitals History (Optional):23777}  Physical Exam   No results found for any visits on 11/16/21.  {Labs (Optional):23779}  The 10-year ASCVD risk score (Arnett DK, et al., 2019) is: 4%    Assessment & Plan:   Problem List Items Addressed This Visit   None   No follow-ups on file.    Rip Harbour, NP

## 2021-11-22 ENCOUNTER — Other Ambulatory Visit: Payer: Self-pay

## 2021-11-22 DIAGNOSIS — R7301 Impaired fasting glucose: Secondary | ICD-10-CM

## 2021-11-22 DIAGNOSIS — R7303 Prediabetes: Secondary | ICD-10-CM

## 2021-11-22 DIAGNOSIS — I10 Essential (primary) hypertension: Secondary | ICD-10-CM

## 2021-11-23 ENCOUNTER — Ambulatory Visit (INDEPENDENT_AMBULATORY_CARE_PROVIDER_SITE_OTHER): Payer: Commercial Managed Care - HMO | Admitting: Nurse Practitioner

## 2021-11-23 ENCOUNTER — Encounter: Payer: Self-pay | Admitting: Nurse Practitioner

## 2021-11-23 VITALS — BP 138/78 | HR 75 | Temp 97.5°F | Ht 63.0 in | Wt 234.0 lb

## 2021-11-23 DIAGNOSIS — M85642 Other cyst of bone, left hand: Secondary | ICD-10-CM

## 2021-11-23 DIAGNOSIS — R7301 Impaired fasting glucose: Secondary | ICD-10-CM | POA: Diagnosis not present

## 2021-11-23 DIAGNOSIS — E782 Mixed hyperlipidemia: Secondary | ICD-10-CM

## 2021-11-23 DIAGNOSIS — Z23 Encounter for immunization: Secondary | ICD-10-CM | POA: Diagnosis not present

## 2021-11-23 DIAGNOSIS — Z6841 Body Mass Index (BMI) 40.0 and over, adult: Secondary | ICD-10-CM

## 2021-11-23 DIAGNOSIS — I10 Essential (primary) hypertension: Secondary | ICD-10-CM

## 2021-11-23 DIAGNOSIS — R5383 Other fatigue: Secondary | ICD-10-CM

## 2021-11-23 DIAGNOSIS — Z1231 Encounter for screening mammogram for malignant neoplasm of breast: Secondary | ICD-10-CM

## 2021-11-23 DIAGNOSIS — R7303 Prediabetes: Secondary | ICD-10-CM | POA: Diagnosis not present

## 2021-11-23 DIAGNOSIS — F339 Major depressive disorder, recurrent, unspecified: Secondary | ICD-10-CM

## 2021-11-23 DIAGNOSIS — F32A Depression, unspecified: Secondary | ICD-10-CM

## 2021-11-23 DIAGNOSIS — Z1211 Encounter for screening for malignant neoplasm of colon: Secondary | ICD-10-CM

## 2021-11-23 DIAGNOSIS — E039 Hypothyroidism, unspecified: Secondary | ICD-10-CM

## 2021-11-23 MED ORDER — CARIPRAZINE HCL 1.5 MG PO CAPS: 1.5000 mg | ORAL_CAPSULE | Freq: Every day | ORAL | 0 refills | Status: DC

## 2021-11-23 NOTE — Progress Notes (Signed)
Subjective:  Patient ID: Danielle Jones, female    DOB: 12/19/1960  Age: 61 y.o. MRN: [id number]  Chief Complaint  Patient presents with   Hyperlipidemia   Hypertension   HPI: Pt presents for follow-up of hypertension, prediabetes, and hyperlipidemia. She c/o fatigue and depression. She had to put her mother in a nursing home, her son is in and out of jail, and her husband has bipolar with street drug use. States her depression is not well controlled. She is not in counseling.  Pt has chronic eft hand/pain toward her thumb. She has a history of previous hand surgery with Dr Eduard Roux. States he referred her to Dr Vernelle Emerald, rheumatologist for evaluation. States Dr Benjamine Mola performed an in-office ultrasound that revealed a cyst. She has requested a referral to orthopedic hand specialist.   Hypertension, follow-up: She was last seen for hypertension 11 months ago.  BP at that visit was 128/72. Management includes Tribenzor.  She reports good compliance with treatment. She is not having side effects.  She is following a Regular diet. She is not exercising. She does not smoke. Use of agents associated with hypertension: thyroid hormones.  Outside blood pressures are not being checked.  Pertinent labs: Lab Results  Component Value Date   CHOL 166 12/22/2020   HDL 47 12/22/2020   LDLCALC 96 12/22/2020   TRIG 131 12/22/2020   CHOLHDL 3.5 12/22/2020   Lab Results  Component Value Date   NA 141 12/22/2020   K 3.8 12/22/2020   CREATININE 1.04 (H) 12/22/2020   EGFR 61 12/22/2020   GFRNONAA >60 05/10/2020   GLUCOSE 110 (H) 12/22/2020     The 10-year ASCVD risk score (Arnett DK, et al., 2019) is: 4.5%     Lipid/Cholesterol, Follow-up  Last lipid panel Other pertinent labs  Lab Results  Component Value Date   CHOL 166 12/22/2020   HDL 47 12/22/2020   LDLCALC 96 12/22/2020   TRIG 131 12/22/2020   CHOLHDL 3.5 12/22/2020   Lab Results  Component Value Date   ALT 16  12/22/2020   AST 14 12/22/2020   PLT 304 12/22/2020   TSH 2.470 09/15/2020     She was last seen for this 11 months ago.  Management includes Lipitor 40 mg QD.  She reports good compliance with treatment. She is not having side effects.   Current diet: in general, a "healthy" diet   Current exercise: none  The 10-year ASCVD risk score (Arnett DK, et al., 2019) is: 4.5%  Prediabetes, Follow-up  Lab Results  Component Value Date   HGBA1C 6.2 (H) 12/22/2020   HGBA1C 6.1 (H) 09/15/2020   GLUCOSE 110 (H) 12/22/2020   GLUCOSE 114 (H) 09/15/2020   GLUCOSE 98 05/10/2020    Last seen for for this11 months ago.  Management since that visit includes Metformin 500 mg BID. Current symptoms include none and have been stable.  Prior visit with dietician: no Current diet: in general, a "healthy" diet   Current exercise: none  Pertinent Labs:    Component Value Date/Time   CHOL 166 12/22/2020 0828   TRIG 131 12/22/2020 0828   CHOLHDL 3.5 12/22/2020 0828   CREATININE 1.04 (H) 12/22/2020 0828    Wt Readings from Last 3 Encounters:  11/23/21 234 lb (106.1 kg)  09/23/21 233 lb (105.7 kg)  04/01/21 235 lb (106.6 kg)    Depression, Follow-up  She  was last seen for this 11 months ago. Current treatment includes Effexor 150 mg.  She reports good compliance with treatment. She is not having side effects.   She reports good tolerance of treatment. Current symptoms include: anhedonia, depressed mood, difficulty concentrating, and fatigue She feels she is Worse since last visit.     11/23/2021   11:26 AM 04/01/2021    9:03 AM 09/15/2020    9:08 AM  Depression screen PHQ 2/9  Decreased Interest 3 2 1   Down, Depressed, Hopeless 2 2 1   PHQ - 2 Score 5 4 2   Altered sleeping 3 3 3   Tired, decreased energy 3 3 3   Change in appetite 0 3 3  Feeling bad or failure about yourself  2 0 3  Trouble concentrating 2 2 3   Moving slowly or fidgety/restless 0 1 0  Suicidal thoughts 0 0 0   PHQ-9 Score 15 16 17   Difficult doing work/chores Very difficult  Somewhat difficult      Current Outpatient Medications on File Prior to Visit  Medication Sig Dispense Refill   atorvastatin (LIPITOR) 40 MG tablet TAKE ONE TABLET BY MOUTH DAILY 90 tablet 1   ergocalciferol (VITAMIN D2) 1.25 MG (50000 UT) capsule TAKE 1 CAPSULE BY MOUTH 1 TIME A WEEK FOR 12 DOSES     levothyroxine (SYNTHROID) 50 MCG tablet TAKE ONE TABLET BY MOUTH DAILY 90 tablet 1   metFORMIN (GLUCOPHAGE) 500 MG tablet Take 1 tablet (500 mg total) by mouth 2 (two) times daily with a meal. 180 tablet 3   Olmesartan-amLODIPine-HCTZ 40-10-25 MG TABS TAKE ONE (1) TABLET BY MOUTH EVERY DAY 90 tablet 1   venlafaxine XR (EFFEXOR-XR) 150 MG 24 hr capsule Take 1 capsule (150 mg total) by mouth daily with breakfast. 14 capsule 0   No current facility-administered medications on file prior to visit.   Past Medical History:  Diagnosis Date   Anxiety    CMC arthritis    left thumb   Depression    Gout    High cholesterol    Hypertension    Hypothyroidism    Past Surgical History:  Procedure Laterality Date   ABDOMINAL HYSTERECTOMY     Carpal tunnel  2019   CARPOMETACARPEL SUSPENSION PLASTY Left 05/12/2020   Procedure: LEFT THUMB CARPOMETACARPAL ARTHROPLASTY;  Surgeon: Leandrew Koyanagi, MD;  Location: [facility];  Service: Orthopedics;  Laterality: Left;   thumb release  2019    Family History  Problem Relation Age of Onset   Arthritis Mother    Alzheimer's disease Mother    Kidney failure Mother    Congenital heart disease Father    Melanoma Father    Healthy Son    Healthy Son    Blindness Son    Mental illness Son    Social History   Socioeconomic History   Marital status: Single    Spouse name: Not on file   Number of children: Not on file   Years of education: Not on file   Highest education level: Not on file  Occupational History   Not on file  Tobacco Use   Smoking status: Never     Passive exposure: Past   Smokeless tobacco: Never  Substance and Sexual Activity   Alcohol use: Yes    Comment: social   Drug use: No   Sexual activity: Not on file  Other Topics Concern   Not on file  Social History Narrative   Not on file   Social Determinants of Health   Financial Resource Strain: Not on file  Food Insecurity:  Not on file  Transportation Needs: Not on file  Physical Activity: Not on file  Stress: Not on file  Social Connections: Not on file    Review of Systems  Constitutional:  Positive for fatigue. Negative for appetite change and fever.  HENT:  Negative for congestion, ear pain, sinus pressure and sore throat.   Respiratory:  Negative for cough, chest tightness, shortness of breath and wheezing.   Cardiovascular:  Negative for chest pain and palpitations.  Gastrointestinal:  Negative for abdominal pain, constipation, diarrhea, nausea and vomiting.  Genitourinary:  Negative for dysuria and hematuria.  Musculoskeletal:  Negative for arthralgias, back pain, joint swelling and myalgias.  Skin:  Negative for rash.  Neurological:  Negative for dizziness, weakness and headaches.  Psychiatric/Behavioral:  Negative for dysphoric mood. The patient is not nervous/anxious.      Objective:  BP 138/78 (BP Location: Left Arm, Patient Position: Sitting)   Pulse 75   Temp (!) 97.5 F (36.4 C) (Temporal)   Ht 5' 3"  (1.6 m)   Wt 234 lb (106.1 kg)   SpO2 99%   BMI 41.45 kg/m      11/23/2021   11:33 AM 09/23/2021    9:30 AM 09/23/2021    9:20 AM  BP/Weight  Systolic BP 235 573 220  Diastolic BP 78 78 81  Wt. (Lbs) 234  233  BMI 41.45 kg/m2  41.94 kg/m2    Physical Exam Vitals reviewed.  Constitutional:      Appearance: She is obese.  HENT:     Right Ear: Tympanic membrane normal.     Left Ear: Tympanic membrane normal.  Cardiovascular:     Rate and Rhythm: Normal rate and regular rhythm.     Heart sounds: Normal heart sounds.  Pulmonary:      Effort: Pulmonary effort is normal.     Breath sounds: Normal breath sounds.  Abdominal:     General: Abdomen is flat. Bowel sounds are normal.     Palpations: Abdomen is soft.  Musculoskeletal:     Cervical back: Neck supple.  Neurological:     Mental Status: She is alert and oriented to person, place, and time.  Psychiatric:        Mood and Affect: Mood normal.        Behavior: Behavior normal.    Lab Results  Component Value Date   WBC 6.0 12/22/2020   HGB 12.4 12/22/2020   HCT 36.8 12/22/2020   PLT 304 12/22/2020   GLUCOSE 110 (H) 12/22/2020   CHOL 166 12/22/2020   TRIG 131 12/22/2020   HDL 47 12/22/2020   LDLCALC 96 12/22/2020   ALT 16 12/22/2020   AST 14 12/22/2020   NA 141 12/22/2020   K 3.8 12/22/2020   CL 99 12/22/2020   CREATININE 1.04 (H) 12/22/2020   BUN 15 12/22/2020   CO2 28 12/22/2020   TSH 2.470 09/15/2020   HGBA1C 6.2 (H) 12/22/2020      Assessment & Plan:   1. Mixed hyperlipidemia-well controlled - CBC with Differential/Platelet - Comprehensive metabolic panel - Lipid panel - Home sleep test -continue Lipitor 40 mg QD -continue heart healthy diet  2. Primary hypertension-well controlled - CBC with Differential/Platelet - Comprehensive metabolic panel - T4, free - TSH - Home sleep test -continue Tribenzor QD   3. Impaired fasting glucose-well controlled - CBC with Differential/Platelet - Comprehensive metabolic panel - Hemoglobin A1c -increase physical activity  4. Prediabetes-well controlled - CBC with Differential/Platelet - Comprehensive metabolic panel -  Hemoglobin A1c - Home sleep test -continue Metformin 500 mg BID  5. Hypothyroidism, unspecified type-well controlled - CBC with Differential/Platelet - Comprehensive metabolic panel - T4, free - TSH -continue Levothyroxine 50 mg QD  6. Morbid obesity with BMI of 40.0-44.9, adult (HCC) - CBC with Differential/Platelet - Comprehensive metabolic panel - Lipid panel -  Hemoglobin A1c - T4, free - TSH - VITAMIN D 25 Hydroxy (Vit-D Deficiency, Fractures) - B12 and Folate Panel - Home sleep test  7. Fatigue due to depression - CBC with Differential/Platelet - Comprehensive metabolic panel - Lipid panel - Hemoglobin A1c - T4, free - TSH - VITAMIN D 25 Hydroxy (Vit-D Deficiency, Fractures) - B12 and Folate Panel - Home sleep test  8. Depression, recurrent (HCC)-not at goal - cariprazine (VRAYLAR) 1.5 MG capsule; Take 1 capsule (1.5 mg total) by mouth daily.  Dispense: 28 capsule; Refill: 0 -continue Effexor 150 mg QD -recommend counseling  9. Other cyst of bone, left hand - Ambulatory referral to Orthopedic Surgery  10. Encounter for screening mammogram for malignant neoplasm of breast - MM Digital Screening; Future  11. Encounter for screening for malignant neoplasm of colon - Ambulatory referral to Gastroenterology  12. Encounter for immunization - Flu Vaccine MDCK QUAD PF   We will call you with lab results, mammogram, colonoscopy, and orthopedic referral  Begin Vraylar 1.5 mg daily Notify office of any adverse side effects Recommend counseling Follow-up in 4 weeks  Follow-up: Return in about 3 months (around 02/23/2022).  An After Visit Summary was printed and given to the patient.  I,Lauren M Auman,acting as a Education administrator for [employer], NP.,have documented all relevant documentation on the behalf of Rip Harbour, NP,as directed by  Rip Harbour, NP while in the presence of Rip Harbour, NP.   I, Rip Harbour, NP, have reviewed all documentation for this visit. The documentation on 11/23/21 for the exam, diagnosis, procedures, and orders are all accurate and complete.   Signed, Rip Harbour, NP [place] [phone]

## 2021-11-23 NOTE — Patient Instructions (Addendum)
We will call you with lab results, mammogram, colonoscopy, and orthopedic referral  Begin Vraylar 1.5 mg daily Notify office of any adverse side effects Recommend counseling Follow-up in 4 weeks  Managing Depression, Adult Depression is a mental health condition that affects your thoughts, feelings, and actions. Being diagnosed with depression can bring you relief if you did not know why you have felt or behaved a certain way. It could also leave you feeling overwhelmed. Finding ways to manage your symptoms can help you feel more positive about your future. How to manage lifestyle changes Being depressed is difficult. Depression can increase the level of everyday stress. Stress can make depression symptoms worse. You may believe your symptoms cannot be managed or will never improve. However, there are many things you can try to help manage your symptoms. There is hope. Managing stress  Stress is your body's reaction to life changes and events, both good and bad. Stress can add to your feelings of depression. Learning to manage your stress can help lessen your feelings of depression. Try some of the following approaches to reducing your stress (stress reduction techniques): Listen to music that you enjoy and that inspires you. Try using a meditation app or take a meditation class. Develop a practice that helps you connect with your spiritual self. Walk in nature, pray, or go to a place of worship. Practice deep breathing. To do this, inhale slowly through your nose. Pause at the top of your inhale for a few seconds and then exhale slowly, letting yourself relax. Repeat this three or four times. Practice yoga to help relax and work your muscles. Choose a stress reduction technique that works for you. These techniques take time and practice to develop. Set aside 5-15 minutes a day to do them. Therapists can offer training in these techniques. Do these things to help manage stress: Keep a  journal. Know your limits. Set healthy boundaries for yourself and others, such as saying "no" when you think something is too much. Pay attention to how you react to certain situations. You may not be able to control everything, but you can change your reaction. Add humor to your life by watching funny movies or shows. Make time for activities that you enjoy and that relax you. Spend less time using electronics, especially at night before bed. The light from screens can make your brain think it is time to get up rather than go to bed.  Medicines Medicines, such as antidepressants, are often a part of treatment for depression. Talk with your pharmacist or health care provider about all the medicines, supplements, and herbal products that you take, their possible side effects, and what medicines and other products are safe to take together. Make sure to report any side effects you may have to your health care provider. Relationships Your health care provider may suggest family therapy, couples therapy, or individual therapy as part of your treatment. How to recognize changes Everyone responds differently to treatment for depression. As you recover from depression, you may start to: Have more interest in doing activities. Feel more hopeful. Have more energy. Eat a more regular amount of food. Have better mental focus. It is important to recognize if your depression is not getting better or is getting worse. The symptoms you had in the beginning may return, such as: Feeling tired. Eating too much or too little. Sleeping too much or too little. Feeling restless, agitated, or hopeless. Trouble focusing or making decisions. Having unexplained aches and pains.  Feeling irritable, angry, or aggressive. If you or your family members notice these symptoms coming back, let your health care provider know right away. Follow these instructions at home: Activity Try to get some form of exercise each  day, such as walking. Try yoga, mindfulness, or other stress reduction techniques. Participate in group activities if you are able. Lifestyle Get enough sleep. Cut down on or stop using caffeine, tobacco, alcohol, and any other harmful substances. Eat a healthy diet that includes plenty of vegetables, fruits, whole grains, low-fat dairy products, and lean protein. Limit foods that are high in solid fats, added sugar, or salt (sodium). General instructions Take over-the-counter and prescription medicines only as told by your health care provider. Keep all follow-up visits. It is important for your health care provider to check on your mood, behavior, and medicines. Your health care provider may need to make changes to your treatment. Where to find support Talking to others  Friends and family members can be sources of support and guidance. Talk to trusted friends or family members about your condition. Explain your symptoms and let them know that you are working with a health care provider to treat your depression. Tell friends and family how they can help. Finances Find mental health providers that fit with your financial situation. Talk with your health care provider if you are worried about access to food, housing, or medicine. Call your insurance company to learn about your co-pays and prescription plan. Where to find more information You can find support in your area from: Anxiety and Depression Association of America (ADAA): adaa.org Mental Health America: mentalhealthamerica.net [employer] on Mental Illness: nami.org Contact a health care provider if: You stop taking your antidepressant medicines, and you have any of these symptoms: Nausea. Headache. Light-headedness. Chills and body aches. Not being able to sleep (insomnia). You or your friends and family think your depression is getting worse. Get help right away if: You have thoughts of hurting yourself or others. Get  help right away if you feel like you may hurt yourself or others, or have thoughts about taking your own life. Go to your nearest emergency room or: Call 911. Call the [facility] at [phone] or 988. This is open 24 hours a day. Text the Crisis Text Line at 819-858-4985. This information is not intended to replace advice given to you by your health care provider. Make sure you discuss any questions you have with your health care provider. Document Revised: 05/24/2021 Document Reviewed: 05/24/2021 Elsevier Patient Education  [place].    Prediabetes Prediabetes is when your blood sugar (blood glucose) level is higher than normal but not high enough for you to be diagnosed with type 2 diabetes. Having prediabetes puts you at risk for developing type 2 diabetes (type 2 diabetes mellitus). With certain lifestyle changes, you may be able to prevent or delay the onset of type 2 diabetes. This is important because type 2 diabetes can lead to serious complications, such as: Heart disease. Stroke. Blindness. Kidney disease. Depression. Poor circulation in the feet and legs. In severe cases, this could lead to surgical removal of a leg (amputation). What are the causes? The exact cause of prediabetes is not known. It may result from insulin resistance. Insulin resistance develops when cells in the body do not respond properly to insulin that the body makes. This can cause excess glucose to build up in the blood. High blood glucose (hyperglycemia) can develop. What increases the risk? The  following factors may make you more likely to develop this condition: You have a family member with type 2 diabetes. You are older than 45 years. You had a temporary form of diabetes during a pregnancy (gestational diabetes). You had polycystic ovary syndrome (PCOS). You are overweight or obese. You are inactive (sedentary). You have a history of heart disease, including problems  with cholesterol levels, high levels of blood fats, or high blood pressure. What are the signs or symptoms? You may have no symptoms. If you do have symptoms, they may include: Increased hunger. Increased thirst. Increased urination. Vision changes, such as blurry vision. Tiredness (fatigue). How is this diagnosed? This condition can be diagnosed with blood tests. Your blood glucose may be checked with one or more of the following tests: A fasting blood glucose (FBG) test. You will not be allowed to eat (you will fast) for at least 8 hours before a blood sample is taken. An A1C blood test (hemoglobin A1C). This test provides information about blood glucose levels over the previous 2?3 months. An oral glucose tolerance test (OGTT). This test measures your blood glucose at two points in time: After fasting. This is your baseline level. Two hours after you drink a beverage that contains glucose. You may be diagnosed with prediabetes if: Your FBG is 100?125 mg/dL (5.6-6.9 mmol/L). Your A1C level is 5.7?6.4% (39-46 mmol/mol). Your OGTT result is 140?199 mg/dL (7.8-11 mmol/L). These blood tests may be repeated to confirm your diagnosis. How is this treated? Treatment may include dietary and lifestyle changes to help lower your blood glucose and prevent type 2 diabetes from developing. In some cases, medicine may be prescribed to help lower the risk of type 2 diabetes. Follow these instructions at home: Nutrition  Follow a healthy meal plan. This includes eating lean proteins, whole grains, legumes, fresh fruits and vegetables, low-fat dairy products, and healthy fats. Follow instructions from your health care provider about eating or drinking restrictions. Meet with a dietitian to create a healthy eating plan that is right for you. Lifestyle Do moderate-intensity exercise for at least 30 minutes a day on 5 or more days each week, or as told by your health care provider. A mix of activities may  be best, such as: Brisk walking, swimming, biking, and weight lifting. Lose weight as told by your health care provider. Losing 5-7% of your body weight can reverse insulin resistance. Do not drink alcohol if: Your health care provider tells you not to drink. You are pregnant, may be pregnant, or are planning to become pregnant. If you drink alcohol: Limit how much you use to: 0-1 drink a day for women. 0-2 drinks a day for men. Be aware of how much alcohol is in your drink. In the [place] one drink equals one 12 oz bottle of beer (355 mL), one 5 oz glass of wine (148 mL), or one 1 oz glass of hard liquor (44 mL). General instructions Take over-the-counter and prescription medicines only as told by your health care provider. You may be prescribed medicines that help lower the risk of type 2 diabetes. Do not use any products that contain nicotine or tobacco, such as cigarettes, e-cigarettes, and chewing tobacco. If you need help quitting, ask your health care provider. Keep all follow-up visits. This is important. Where to find more information American Diabetes Association: www.diabetes.org Academy of Nutrition and Dietetics: www.eatright.org American Heart Association: www.heart.org Contact a health care provider if: You have any of these symptoms: Increased hunger. Increased urination.  Increased thirst. Fatigue. Vision changes, such as blurry vision. Get help right away if you: Have shortness of breath. Feel confused. Vomit or feel like you may vomit. Summary Prediabetes is when your blood sugar (blood glucose)level is higher than normal but not high enough for you to be diagnosed with type 2 diabetes. Having prediabetes puts you at risk for developing type 2 diabetes (type 2 diabetes mellitus). Make lifestyle changes such as eating a healthy diet and exercising regularly to help prevent diabetes. Lose weight as told by your health care provider. This information is not intended to  replace advice given to you by your health care provider. Make sure you discuss any questions you have with your health care provider. Document Revised: 04/17/2019 Document Reviewed: 04/17/2019 Elsevier Patient Education  [place].    Prediabetes Eating Plan Prediabetes is a condition that causes blood sugar (glucose) levels to be higher than normal. This increases the risk for developing type 2 diabetes (type 2 diabetes mellitus). Working with a health care provider or nutrition specialist (dietitian) to make diet and lifestyle changes can help prevent the onset of diabetes. These changes may help you: Control your blood glucose levels. Improve your cholesterol levels. Manage your blood pressure. What are tips for following this plan? Reading food labels Read food labels to check the amount of fat, salt (sodium), and sugar in prepackaged foods. Avoid foods that have: Saturated fats. Trans fats. Added sugars. Avoid foods that have more than 300 milligrams (mg) of sodium per serving. Limit your sodium intake to less than 2,300 mg each day. Shopping Avoid buying pre-made and processed foods. Avoid buying drinks with added sugar. Cooking Cook with olive oil. Do not use butter, lard, or ghee. Bake, broil, grill, steam, or boil foods. Avoid frying. Meal planning  Work with your dietitian to create an eating plan that is right for you. This may include tracking how many calories you take in each day. Use a food diary, notebook, or mobile application to track what you eat at each meal. Consider following a Mediterranean diet. This includes: Eating several servings of fresh fruits and vegetables each day. Eating fish at least twice a week. Eating one serving each day of whole grains, beans, nuts, and seeds. Using olive oil instead of other fats. Limiting alcohol. Limiting red meat. Using nonfat or low-fat dairy products. Consider following a plant-based diet. This includes  dietary choices that focus on eating mostly vegetables and fruit, grains, beans, nuts, and seeds. If you have high blood pressure, you may need to limit your sodium intake or follow a diet such as the DASH (Dietary Approaches to Stop Hypertension) eating plan. The DASH diet aims to lower high blood pressure. Lifestyle Set weight loss goals with help from your health care team. It is recommended that most people with prediabetes lose 7% of their body weight. Exercise for at least 30 minutes 5 or more days a week. Attend a support group or seek support from a mental health counselor. Take over-the-counter and prescription medicines only as told by your health care provider. What foods are recommended? Fruits Berries. Bananas. Apples. Oranges. Grapes. Papaya. Mango. Pomegranate. Kiwi. Grapefruit. Cherries. Vegetables Lettuce. Spinach. Peas. Beets. Cauliflower. Cabbage. Broccoli. Carrots. Tomatoes. Squash. Eggplant. Herbs. Peppers. Onions. Cucumbers. Brussels sprouts. Grains Whole grains, such as whole-wheat or whole-grain breads, crackers, cereals, and pasta. Unsweetened oatmeal. Bulgur. Barley. Quinoa. Brown rice. Corn or whole-wheat flour tortillas or taco shells. Meats and other proteins Seafood. Poultry without skin. [employer]  cuts of pork and beef. Tofu. Eggs. Nuts. Beans. Dairy Low-fat or fat-free dairy products, such as yogurt, cottage cheese, and cheese. Beverages Water. Tea. Coffee. Sugar-free or diet soda. Seltzer water. Low-fat or nonfat milk. Milk alternatives, such as soy or almond milk. Fats and oils Olive oil. Canola oil. Sunflower oil. Grapeseed oil. Avocado. Walnuts. Sweets and desserts Sugar-free or low-fat pudding. Sugar-free or low-fat ice cream and other frozen treats. Seasonings and condiments Herbs. Sodium-free spices. Mustard. Relish. Low-salt, low-sugar ketchup. Low-salt, low-sugar barbecue sauce. Low-fat or fat-free mayonnaise. The items listed above may not be a complete  list of recommended foods and beverages. Contact a dietitian for more information. What foods are not recommended? Fruits Fruits canned with syrup. Vegetables Canned vegetables. Frozen vegetables with butter or cream sauce. Grains Refined white flour and flour products, such as bread, pasta, snack foods, and cereals. Meats and other proteins Fatty cuts of meat. Poultry with skin. Breaded or fried meat. Processed meats. Dairy Full-fat yogurt, cheese, or milk. Beverages Sweetened drinks, such as iced tea and soda. Fats and oils Butter. Lard. Ghee. Sweets and desserts Baked goods, such as cake, cupcakes, pastries, cookies, and cheesecake. Seasonings and condiments Spice mixes with added salt. Ketchup. Barbecue sauce. Mayonnaise. The items listed above may not be a complete list of foods and beverages that are not recommended. Contact a dietitian for more information. Where to find more information American Diabetes Association: www.diabetes.org Summary You may need to make diet and lifestyle changes to help prevent the onset of diabetes. These changes can help you control blood sugar, improve cholesterol levels, and manage blood pressure. Set weight loss goals with help from your health care team. It is recommended that most people with prediabetes lose 7% of their body weight. Consider following a Mediterranean diet. This includes eating plenty of fresh fruits and vegetables, whole grains, beans, nuts, seeds, fish, and low-fat dairy, and using olive oil instead of other fats. This information is not intended to replace advice given to you by your health care provider. Make sure you discuss any questions you have with your health care provider. Document Revised: 04/17/2019 Document Reviewed: 04/17/2019 Elsevier Patient Education  [place].

## 2021-11-24 LAB — COMPREHENSIVE METABOLIC PANEL
ALT: 27 IU/L (ref 0–32)
AST: 21 IU/L (ref 0–40)
Albumin/Globulin Ratio: 2 (ref 1.2–2.2)
Albumin: 4.6 g/dL (ref 3.9–4.9)
Alkaline Phosphatase: 122 IU/L — ABNORMAL HIGH (ref 44–121)
BUN/Creatinine Ratio: 13 (ref 12–28)
BUN: 12 mg/dL (ref 8–27)
Bilirubin Total: 0.5 mg/dL (ref 0.0–1.2)
CO2: 27 mmol/L (ref 20–29)
Calcium: 9.6 mg/dL (ref 8.7–10.3)
Chloride: 99 mmol/L (ref 96–106)
Creatinine, Ser: 0.91 mg/dL (ref 0.57–1.00)
Globulin, Total: 2.3 g/dL (ref 1.5–4.5)
Glucose: 92 mg/dL (ref 70–99)
Potassium: 4.1 mmol/L (ref 3.5–5.2)
Sodium: 143 mmol/L (ref 134–144)
Total Protein: 6.9 g/dL (ref 6.0–8.5)
eGFR: 71 mL/min/{1.73_m2} (ref >59)

## 2021-11-24 LAB — CBC WITH DIFFERENTIAL/PLATELET
Basophils Absolute: 0.1 10*3/uL (ref 0.0–0.2)
Basos: 1 %
EOS (ABSOLUTE): 0.2 10*3/uL (ref 0.0–0.4)
Eos: 3 %
Hematocrit: 37.9 % (ref 34.0–46.6)
Hemoglobin: 12 g/dL (ref 11.1–15.9)
Immature Grans (Abs): 0 10*3/uL (ref 0.0–0.1)
Immature Granulocytes: 1 %
Lymphocytes Absolute: 1.8 10*3/uL (ref 0.7–3.1)
Lymphs: 27 %
MCH: 28.2 pg (ref 26.6–33.0)
MCHC: 31.7 g/dL (ref 31.5–35.7)
MCV: 89 fL (ref 79–97)
Monocytes Absolute: 0.6 10*3/uL (ref 0.1–0.9)
Monocytes: 9 %
Neutrophils Absolute: 3.8 10*3/uL (ref 1.4–7.0)
Neutrophils: 59 %
Platelets: 294 10*3/uL (ref 150–450)
RBC: 4.26 x10E6/uL (ref 3.77–5.28)
RDW: 13.4 % (ref 11.7–15.4)
WBC: 6.4 10*3/uL (ref 3.4–10.8)

## 2021-11-24 LAB — HEMOGLOBIN A1C
Est. average glucose Bld gHb Est-mCnc: 131 mg/dL
Hgb A1c MFr Bld: 6.2 % — ABNORMAL HIGH (ref 4.8–5.6)

## 2021-11-24 LAB — LIPID PANEL
Chol/HDL Ratio: 3.4 ratio (ref 0.0–4.4)
Cholesterol, Total: 168 mg/dL (ref 100–199)
HDL: 50 mg/dL (ref >39)
LDL Chol Calc (NIH): 90 mg/dL (ref 0–99)
Triglycerides: 162 mg/dL — ABNORMAL HIGH (ref 0–149)
VLDL Cholesterol Cal: 28 mg/dL (ref 5–40)

## 2021-11-24 LAB — B12 AND FOLATE PANEL
Folate: 6.9 ng/mL (ref >3.0)
Vitamin B-12: 213 pg/mL — ABNORMAL LOW (ref 232–1245)

## 2021-11-24 LAB — VITAMIN D 25 HYDROXY (VIT D DEFICIENCY, FRACTURES): Vit D, 25-Hydroxy: 62.2 ng/mL (ref 30.0–100.0)

## 2021-11-24 LAB — TSH: TSH: 1.61 u[IU]/mL (ref 0.450–4.500)

## 2021-11-24 LAB — CARDIOVASCULAR RISK ASSESSMENT

## 2021-11-24 LAB — T4, FREE: Free T4: 1.09 ng/dL (ref 0.82–1.77)

## 2021-11-25 ENCOUNTER — Other Ambulatory Visit: Payer: Self-pay | Admitting: Nurse Practitioner

## 2021-11-25 ENCOUNTER — Encounter: Payer: Self-pay | Admitting: Gastroenterology

## 2021-11-25 ENCOUNTER — Other Ambulatory Visit: Payer: Self-pay

## 2021-11-25 DIAGNOSIS — M109 Gout, unspecified: Secondary | ICD-10-CM

## 2021-11-27 MED ORDER — CYANOCOBALAMIN 1000 MCG/ML IJ SOLN: 1000.0000 ug | Freq: Once | INTRAMUSCULAR | 0 refills | Status: AC

## 2021-11-28 ENCOUNTER — Other Ambulatory Visit: Payer: Self-pay

## 2021-11-28 DIAGNOSIS — E539 Vitamin B deficiency, unspecified: Secondary | ICD-10-CM

## 2021-11-28 MED ORDER — CYANOCOBALAMIN 1000 MCG/ML IJ SOLN: INTRAMUSCULAR | 0 refills | Status: DC

## 2021-12-01 ENCOUNTER — Ambulatory Visit (INDEPENDENT_AMBULATORY_CARE_PROVIDER_SITE_OTHER): Payer: Commercial Managed Care - HMO

## 2021-12-01 DIAGNOSIS — E539 Vitamin B deficiency, unspecified: Secondary | ICD-10-CM | POA: Diagnosis not present

## 2021-12-01 MED ORDER — CYANOCOBALAMIN 1000 MCG/ML IJ SOLN
1000.0000 ug | Freq: Once | INTRAMUSCULAR | Status: AC
  Administered 2021-12-01: 1000 ug via INTRAMUSCULAR

## 2021-12-01 NOTE — Progress Notes (Signed)
Patient came in today for B-12 injection, tolerated well.

## 2021-12-05 ENCOUNTER — Other Ambulatory Visit: Payer: Self-pay | Admitting: Nurse Practitioner

## 2021-12-05 DIAGNOSIS — E039 Hypothyroidism, unspecified: Secondary | ICD-10-CM

## 2021-12-05 DIAGNOSIS — I10 Essential (primary) hypertension: Secondary | ICD-10-CM

## 2021-12-05 DIAGNOSIS — E782 Mixed hyperlipidemia: Secondary | ICD-10-CM

## 2021-12-05 DIAGNOSIS — R7301 Impaired fasting glucose: Secondary | ICD-10-CM

## 2021-12-05 DIAGNOSIS — M109 Gout, unspecified: Secondary | ICD-10-CM

## 2021-12-05 DIAGNOSIS — R7303 Prediabetes: Secondary | ICD-10-CM

## 2021-12-05 MED ORDER — LEVOTHYROXINE SODIUM 50 MCG PO TABS: 50.0000 ug | ORAL_TABLET | Freq: Every day | ORAL | 1 refills | Status: DC

## 2021-12-05 MED ORDER — OLMESARTAN-AMLODIPINE-HCTZ 40-10-25 MG PO TABS: ORAL_TABLET | ORAL | 1 refills | Status: DC

## 2021-12-05 MED ORDER — METFORMIN HCL 500 MG PO TABS: 500.0000 mg | ORAL_TABLET | Freq: Two times a day (BID) | ORAL | 3 refills | Status: DC

## 2021-12-05 MED ORDER — ATORVASTATIN CALCIUM 40 MG PO TABS: 40.0000 mg | ORAL_TABLET | Freq: Every day | ORAL | 1 refills | Status: DC

## 2021-12-05 MED ORDER — VENLAFAXINE HCL ER 150 MG PO CP24: 150.0000 mg | ORAL_CAPSULE | Freq: Every day | ORAL | 1 refills | Status: DC

## 2021-12-08 ENCOUNTER — Ambulatory Visit (INDEPENDENT_AMBULATORY_CARE_PROVIDER_SITE_OTHER): Payer: Commercial Managed Care - HMO

## 2021-12-08 DIAGNOSIS — E539 Vitamin B deficiency, unspecified: Secondary | ICD-10-CM | POA: Diagnosis not present

## 2021-12-08 MED ORDER — CYANOCOBALAMIN 1000 MCG/ML IJ SOLN
1000.0000 ug | Freq: Once | INTRAMUSCULAR | Status: AC
  Administered 2021-12-08: 1000 ug via INTRAMUSCULAR

## 2021-12-15 ENCOUNTER — Ambulatory Visit (INDEPENDENT_AMBULATORY_CARE_PROVIDER_SITE_OTHER): Payer: Commercial Managed Care - HMO

## 2021-12-15 DIAGNOSIS — E539 Vitamin B deficiency, unspecified: Secondary | ICD-10-CM | POA: Diagnosis not present

## 2021-12-15 MED ORDER — CYANOCOBALAMIN 1000 MCG/ML IJ SOLN
1000.0000 ug | Freq: Once | INTRAMUSCULAR | Status: AC
  Administered 2021-12-15: 1000 ug via INTRAMUSCULAR

## 2021-12-15 NOTE — Progress Notes (Signed)
   B12 injection given per order, patient tolerated well.   Erie Noe, LPN 6:80 AM

## 2021-12-20 ENCOUNTER — Ambulatory Visit (INDEPENDENT_AMBULATORY_CARE_PROVIDER_SITE_OTHER): Payer: Commercial Managed Care - HMO | Admitting: Nurse Practitioner

## 2021-12-20 ENCOUNTER — Encounter: Payer: Self-pay | Admitting: Nurse Practitioner

## 2021-12-20 ENCOUNTER — Other Ambulatory Visit: Payer: Self-pay | Admitting: Nurse Practitioner

## 2021-12-20 VITALS — BP 122/60 | HR 79 | Temp 97.3°F | Ht 63.0 in | Wt 232.0 lb

## 2021-12-20 DIAGNOSIS — G4733 Obstructive sleep apnea (adult) (pediatric): Secondary | ICD-10-CM

## 2021-12-20 DIAGNOSIS — F339 Major depressive disorder, recurrent, unspecified: Secondary | ICD-10-CM

## 2021-12-20 DIAGNOSIS — E539 Vitamin B deficiency, unspecified: Secondary | ICD-10-CM

## 2021-12-20 MED ORDER — CYANOCOBALAMIN 1000 MCG/ML IJ SOLN: 1000.0000 ug | Freq: Once | INTRAMUSCULAR | 0 refills | Status: AC

## 2021-12-20 MED ORDER — VENLAFAXINE HCL ER 150 MG PO CP24: 150.0000 mg | ORAL_CAPSULE | Freq: Every day | ORAL | 1 refills | Status: DC

## 2021-12-20 MED ORDER — CYANOCOBALAMIN 1000 MCG/ML IJ SOLN
1000.0000 ug | Freq: Once | INTRAMUSCULAR | Status: AC
  Administered 2021-12-20: 1000 ug via INTRAMUSCULAR

## 2021-12-20 MED ORDER — CARIPRAZINE HCL 1.5 MG PO CAPS: 1.5000 mg | ORAL_CAPSULE | Freq: Every day | ORAL | 1 refills | Status: DC

## 2021-12-20 NOTE — Addendum Note (Signed)
Addended by: Marlyn Corporal A on: 12/20/2021 03:28 PM   Modules accepted: Orders

## 2021-12-20 NOTE — Progress Notes (Signed)
Subjective:  Patient ID: Danielle Jones, female    DOB: 04-16-1960  Age: 61 y.o. MRN: [id number]  Chief Complaint  Patient presents with   Sleep apnea   Depression    HPI: Pt presents for follow-up of OSA and depression. Home sleep study reveals OSA with AHI 41. Pt needs a CPAP machine. Results discussed and she has agreed to proceed with CPAP machine order. She will check with her insurance company and local home O2 supply stores concerning cost.   Depression, Follow-up  She  was last seen for this 4 months ago. Current treatment includes Effexor 150 mg and Vraylar 1.5 mg QD.   She reports excellent compliance with treatment. She is not having side effects.   She reports good tolerance of treatment. Current symptoms include: fatigue; Pt positive for sleep apnea, may be contributing to fatigue.  She feels she is Improved since last visit.     12/20/2021   12:01 PM 11/23/2021   11:26 AM 04/01/2021    9:03 AM  Depression screen PHQ 2/9  Decreased Interest '2 3 2  '$ Down, Depressed, Hopeless '2 2 2  '$ PHQ - 2 Score '4 5 4  '$ Altered sleeping '3 3 3  '$ Tired, decreased energy '3 3 3  '$ Change in appetite 2 0 3  Feeling bad or failure about yourself  1 2 0  Trouble concentrating '1 2 2  '$ Moving slowly or fidgety/restless 0 0 1  Suicidal thoughts 0 0 0  PHQ-9 Score '14 15 16  '$ Difficult doing work/chores Not difficult at all Very difficult        Current Outpatient Medications on File Prior to Visit  Medication Sig Dispense Refill   atorvastatin (LIPITOR) 40 MG tablet Take 1 tablet (40 mg total) by mouth daily. 90 tablet 1   cariprazine (VRAYLAR) 1.5 MG capsule Take 1 capsule (1.5 mg total) by mouth daily. 28 capsule 0   cyanocobalamin (VITAMIN B12) 1000 MCG/ML injection Inject one ml weekly for four weeks then inject one ml monthly 7 mL 0   ergocalciferol (VITAMIN D2) 1.25 MG (50000 UT) capsule TAKE 1 CAPSULE BY MOUTH 1 TIME A WEEK FOR 12 DOSES     levothyroxine (SYNTHROID) 50 MCG tablet  Take 1 tablet (50 mcg total) by mouth daily. 90 tablet 1   metFORMIN (GLUCOPHAGE) 500 MG tablet Take 1 tablet (500 mg total) by mouth 2 (two) times daily with a meal. 180 tablet 3   Olmesartan-amLODIPine-HCTZ 40-10-25 MG TABS TAKE ONE (1) TABLET BY MOUTH EVERY DAY 90 tablet 1   venlafaxine XR (EFFEXOR-XR) 150 MG 24 hr capsule Take 1 capsule (150 mg total) by mouth daily with breakfast. 90 capsule 1   No current facility-administered medications on file prior to visit.   Past Medical History:  Diagnosis Date   Anxiety    CMC arthritis    left thumb   Depression    Gout    High cholesterol    Hypertension    Hypothyroidism    Past Surgical History:  Procedure Laterality Date   ABDOMINAL HYSTERECTOMY     Carpal tunnel  2019   CARPOMETACARPEL SUSPENSION PLASTY Left 05/12/2020   Procedure: LEFT THUMB CARPOMETACARPAL ARTHROPLASTY;  Surgeon: Leandrew Koyanagi, MD;  Location: [facility];  Service: Orthopedics;  Laterality: Left;   thumb release  2019    Family History  Problem Relation Age of Onset   Arthritis Mother    Alzheimer's disease Mother    Kidney failure Mother  Congenital heart disease Father    Melanoma Father    Healthy Son    Healthy Son    Blindness Son    Mental illness Son    Social History   Socioeconomic History   Marital status: Single    Spouse name: Not on file   Number of children: Not on file   Years of education: Not on file   Highest education level: Not on file  Occupational History   Not on file  Tobacco Use   Smoking status: Never    Passive exposure: Past   Smokeless tobacco: Never  Substance and Sexual Activity   Alcohol use: Yes    Comment: social   Drug use: No   Sexual activity: Not on file  Other Topics Concern   Not on file  Social History Narrative   Not on file   Social Determinants of Health   Financial Resource Strain: Not on file  Food Insecurity: Not on file  Transportation Needs: Not on file  Physical  Activity: Not on file  Stress: Not on file  Social Connections: Not on file    Review of Systems  Constitutional:  Positive for fatigue. Negative for chills.  HENT:  Negative for congestion, ear pain, sinus pain and sore throat.   Respiratory:  Negative for cough and shortness of breath.   Cardiovascular:  Negative for chest pain and leg swelling.  Gastrointestinal:  Negative for abdominal pain, constipation, diarrhea, nausea and vomiting.  Genitourinary:  Negative for dysuria and frequency.  Musculoskeletal:  Negative for arthralgias, back pain and myalgias.  Neurological:  Negative for dizziness and headaches.     Objective:  BP 122/60   Pulse 79   Temp (!) 97.3 F (36.3 C)   Ht '5\' 3"'$  (1.6 m)   Wt 232 lb (105.2 kg)   SpO2 96%   BMI 41.10 kg/m      12/20/2021   10:01 AM 11/23/2021   11:33 AM 09/23/2021    9:30 AM  BP/Weight  Systolic BP [phone]  Diastolic BP 60 78 78  Wt. (Lbs) 232 234   BMI 41.1 kg/m2 41.45 kg/m2     Physical Exam Vitals reviewed.  Constitutional:      Appearance: She is obese.  Cardiovascular:     Rate and Rhythm: Normal rate and regular rhythm.     Pulses: Normal pulses.     Heart sounds: Normal heart sounds.  Skin:    General: Skin is warm and dry.     Capillary Refill: Capillary refill takes less than 2 seconds.  Neurological:     General: No focal deficit present.     Mental Status: She is alert and oriented to person, place, and time.  Psychiatric:        Mood and Affect: Mood normal.        Behavior: Behavior normal.       Lab Results  Component Value Date   WBC 6.4 11/23/2021   HGB 12.0 11/23/2021   HCT 37.9 11/23/2021   PLT 294 11/23/2021   GLUCOSE 92 11/23/2021   CHOL 168 11/23/2021   TRIG 162 (H) 11/23/2021   HDL 50 11/23/2021   LDLCALC 90 11/23/2021   ALT 27 11/23/2021   AST 21 11/23/2021   NA 143 11/23/2021   K 4.1 11/23/2021   CL 99 11/23/2021   CREATININE 0.91 11/23/2021   BUN 12 11/23/2021   CO2 27  11/23/2021   TSH 1.610 11/23/2021  HGBA1C 6.2 (H) 11/23/2021      Assessment & Plan:   1. OSA (obstructive sleep apnea) -awaiting pt to notify office of CPAP machine supply store preference  2. Depression, recurrent (HCC)-stable -continue Effexor 150 mg QD -continue Vraylar 1.5 mg QD -recommend counseling        Follow-up: 25-month, fasting  An After Visit Summary was printed and given to the patient.  I, SRip Harbour NP, have reviewed all documentation for this visit. The documentation on 12/20/21 for the exam, diagnosis, procedures, and orders are all accurate and complete.    Signed, SRip Harbour NP [facility]([phone]

## 2021-12-20 NOTE — Addendum Note (Signed)
Addended by: Marlyn Corporal A on: 12/20/2021 03:38 PM   Modules accepted: Orders

## 2021-12-21 ENCOUNTER — Ambulatory Visit: Payer: Commercial Managed Care - HMO

## 2022-01-02 ENCOUNTER — Ambulatory Visit (AMBULATORY_SURGERY_CENTER): Payer: Commercial Managed Care - HMO | Admitting: *Deleted

## 2022-01-02 VITALS — Ht 63.0 in | Wt 237.0 lb

## 2022-01-02 DIAGNOSIS — Z1211 Encounter for screening for malignant neoplasm of colon: Secondary | ICD-10-CM

## 2022-01-02 MED ORDER — NA SULFATE-K SULFATE-MG SULF 17.5-3.13-1.6 GM/177ML PO SOLN: 1.0000 | Freq: Once | ORAL | 0 refills | Status: AC

## 2022-01-02 NOTE — Progress Notes (Signed)

## 2022-01-17 ENCOUNTER — Encounter: Payer: Self-pay | Admitting: Gastroenterology

## 2022-01-18 ENCOUNTER — Ambulatory Visit (INDEPENDENT_AMBULATORY_CARE_PROVIDER_SITE_OTHER): Payer: Commercial Managed Care - HMO

## 2022-01-18 DIAGNOSIS — E539 Vitamin B deficiency, unspecified: Secondary | ICD-10-CM

## 2022-01-18 MED ORDER — CYANOCOBALAMIN 1000 MCG/ML IJ SOLN
1000.0000 ug | Freq: Once | INTRAMUSCULAR | Status: AC
  Administered 2022-01-18: 1000 ug via INTRAMUSCULAR

## 2022-01-18 NOTE — Progress Notes (Signed)
   B12 injection given per order, patient tolerated well.   Erie Noe, LPN 5:36 AM

## 2022-01-19 ENCOUNTER — Encounter: Payer: Self-pay | Admitting: Gastroenterology

## 2022-01-19 ENCOUNTER — Ambulatory Visit (AMBULATORY_SURGERY_CENTER): Payer: Commercial Managed Care - HMO | Admitting: Gastroenterology

## 2022-01-19 VITALS — BP 103/56 | HR 72 | Temp 96.1°F | Resp 14 | Ht 63.0 in | Wt 237.0 lb

## 2022-01-19 DIAGNOSIS — Z1211 Encounter for screening for malignant neoplasm of colon: Secondary | ICD-10-CM

## 2022-01-19 MED ORDER — SODIUM CHLORIDE 0.9 % IV SOLN: 500.0000 mL | Freq: Once | INTRAVENOUS | Status: DC

## 2022-01-19 NOTE — Progress Notes (Signed)
Pt's states no medical or surgical changes since previsit or office visit. 

## 2022-01-19 NOTE — Progress Notes (Signed)
[facility] History and Physical   Primary Care Physician:  Rip Harbour, NP   Reason for Procedure:   CRC screening  Plan:    colon     HPI: Danielle Jones is a 61 y.o. female  No nausea, vomiting, heartburn, regurgitation, odynophagia or dysphagia.  No significant diarrhea or constipation.  No melena or hematochezia. No unintentional weight loss. No abdominal pain.   Past Medical History:  Diagnosis Date   Anemia    "YOUNGER"   Anxiety    Arthritis    HANDS   CMC arthritis    left thumb   Depression    Gout    High cholesterol    Hypertension    Hypothyroidism    Sleep apnea     Past Surgical History:  Procedure Laterality Date   ABDOMINAL HYSTERECTOMY     2007   Carpal tunnel  2019   CARPOMETACARPEL SUSPENSION PLASTY Left 05/12/2020   Procedure: LEFT THUMB CARPOMETACARPAL ARTHROPLASTY;  Surgeon: Leandrew Koyanagi, MD;  Location: [place];  Service: Orthopedics;  Laterality: Left;   thumb release  2019    Prior to Admission medications   Medication Sig Start Date End Date Taking? Authorizing Provider  atorvastatin (LIPITOR) 40 MG tablet Take 1 tablet (40 mg total) by mouth daily. 12/05/21  Yes Rip Harbour, NP  cariprazine (VRAYLAR) 1.5 MG capsule Take 1 capsule (1.5 mg total) by mouth daily. 12/20/21  Yes Rip Harbour, NP  cyanocobalamin (VITAMIN B12) 1000 MCG/ML injection Inject one ml weekly for four weeks then inject one ml monthly 11/28/21  Yes Heaton, Thornell Mule, NP  ergocalciferol (VITAMIN D2) 1.25 MG (50000 UT) capsule TAKE 1 CAPSULE BY MOUTH 1 TIME A WEEK FOR 12 DOSES 01/07/20  Yes [provider]  levothyroxine (SYNTHROID) 50 MCG tablet Take 1 tablet (50 mcg total) by mouth daily. 12/05/21  Yes Rip Harbour, NP  metFORMIN (GLUCOPHAGE) 500 MG tablet Take 1 tablet (500 mg total) by mouth 2 (two) times daily with a meal. 12/05/21  Yes Rip Harbour, NP  Olmesartan-amLODIPine-HCTZ 40-10-25 MG TABS TAKE  ONE (1) TABLET BY MOUTH EVERY DAY 12/05/21  Yes Rip Harbour, NP  venlafaxine XR (EFFEXOR-XR) 150 MG 24 hr capsule Take 1 capsule (150 mg total) by mouth daily with breakfast. 12/20/21  Yes Rip Harbour, NP    Current Outpatient Medications  Medication Sig Dispense Refill   atorvastatin (LIPITOR) 40 MG tablet Take 1 tablet (40 mg total) by mouth daily. 90 tablet 1   cariprazine (VRAYLAR) 1.5 MG capsule Take 1 capsule (1.5 mg total) by mouth daily. 90 capsule 1   cyanocobalamin (VITAMIN B12) 1000 MCG/ML injection Inject one ml weekly for four weeks then inject one ml monthly 7 mL 0   ergocalciferol (VITAMIN D2) 1.25 MG (50000 UT) capsule TAKE 1 CAPSULE BY MOUTH 1 TIME A WEEK FOR 12 DOSES     levothyroxine (SYNTHROID) 50 MCG tablet Take 1 tablet (50 mcg total) by mouth daily. 90 tablet 1   metFORMIN (GLUCOPHAGE) 500 MG tablet Take 1 tablet (500 mg total) by mouth 2 (two) times daily with a meal. 180 tablet 3   Olmesartan-amLODIPine-HCTZ 40-10-25 MG TABS TAKE ONE (1) TABLET BY MOUTH EVERY DAY 90 tablet 1   venlafaxine XR (EFFEXOR-XR) 150 MG 24 hr capsule Take 1 capsule (150 mg total) by mouth daily with breakfast. 90 capsule 1   Current Facility-Administered Medications  Medication Dose Route Frequency Provider Last  Rate Last Admin   0.9 %  sodium chloride infusion  500 mL Intravenous Once Jackquline Denmark, MD        Allergies as of 01/19/2022   (No Known Allergies)    Family History  Problem Relation Age of Onset   Arthritis Mother    Alzheimer's disease Mother    Kidney failure Mother    Congenital heart disease Father    Melanoma Father    Healthy Son    Healthy Son    Blindness Son    Mental illness Son    Colon cancer Neg Hx    Colon polyps Neg Hx    Crohn's disease Neg Hx    Esophageal cancer Neg Hx    Stomach cancer Neg Hx    Rectal cancer Neg Hx    Ulcerative colitis Neg Hx     Social History   Socioeconomic History   Marital status: Single    Spouse name:  Not on file   Number of children: Not on file   Years of education: Not on file   Highest education level: Not on file  Occupational History   Not on file  Tobacco Use   Smoking status: Never    Passive exposure: Past   Smokeless tobacco: Never  Vaping Use   Vaping Use: Never used  Substance and Sexual Activity   Alcohol use: Yes    Comment: social   Drug use: No   Sexual activity: Not on file  Other Topics Concern   Not on file  Social History Narrative   Not on file   Social Determinants of Health   Financial Resource Strain: Not on file  Food Insecurity: Not on file  Transportation Needs: Not on file  Physical Activity: Not on file  Stress: Not on file  Social Connections: Not on file  Intimate Partner Violence: Not on file    Review of Systems: Positive for none All other review of systems negative except as mentioned in the HPI.  Physical Exam: Vital signs in last 24 hours: '@VSRANGES'$ @   General:   Alert,  Well-developed, well-nourished, pleasant and cooperative in NAD Lungs:  Clear throughout to auscultation.   Heart:  Regular rate and rhythm; no murmurs, clicks, rubs,  or gallops. Abdomen:  Soft, nontender and nondistended. Normal bowel sounds.   Neuro/Psych:  Alert and cooperative. Normal mood and affect. A and O x 3    No significant changes were identified.  The patient continues to be an appropriate candidate for the planned procedure and anesthesia.   Carmell Austria, MD. [facility] 01/19/2022 10:46 AM@

## 2022-01-19 NOTE — Patient Instructions (Signed)
Read all of the handouts given to you by your recovery room nurse.  Resume all of your medications today as ordered.  YOU HAD AN ENDOSCOPIC PROCEDURE TODAY AT [facility]:   Refer to the procedure report that was given to you for any specific questions about what was found during the examination.  If the procedure report does not answer your questions, please call your gastroenterologist to clarify.  If you requested that your care partner not be given the details of your procedure findings, then the procedure report has been included in a sealed envelope for you to review at your convenience later.  YOU SHOULD EXPECT: Some feelings of bloating in the abdomen. Passage of more gas than usual.  Walking can help get rid of the air that was put into your GI tract during the procedure and reduce the bloating. If you had a lower endoscopy (such as a colonoscopy or flexible sigmoidoscopy) you may notice spotting of blood in your stool or on the toilet paper. If you underwent a bowel prep for your procedure, you may not have a normal bowel movement for a few days.  Please Note:  You might notice some irritation and congestion in your nose or some drainage.  This is from the oxygen used during your procedure.  There is no need for concern and it should clear up in a day or so.  SYMPTOMS TO REPORT IMMEDIATELY:  Following lower endoscopy (colonoscopy or flexible sigmoidoscopy):  Excessive amounts of blood in the stool  Significant tenderness or worsening of abdominal pains  Swelling of the abdomen that is new, acute  Fever of 100F or higher  For urgent or emergent issues, a gastroenterologist can be reached at any hour by calling [phone]. Do not use MyChart messaging for urgent concerns.    DIET:  We do recommend a small meal at first, but then you may proceed to your regular diet.  Drink plenty of fluids but you should avoid alcoholic beverages for 24 hours. Try to increase the  fiber in your diet, and drink plenty of water.  ACTIVITY:  You should plan to take it easy for the rest of today and you should NOT DRIVE or use heavy machinery until tomorrow (because of the sedation medicines used during the test).    FOLLOW UP: Our staff will call the number listed on your records the next business day following your procedure.  We will call around 7:15- 8:00 am to check on you and address any questions or concerns that you may have regarding the information given to you following your procedure. If we do not reach you, we will leave a message.      SIGNATURES/CONFIDENTIALITY: You and/or your care partner have signed paperwork which will be entered into your electronic medical record.  These signatures attest to the fact that that the information above on your After Visit Summary has been reviewed and is understood.  Full responsibility of the confidentiality of this discharge information lies with you and/or your care-partner.

## 2022-01-19 NOTE — Op Note (Signed)
[facility] Patient Name: Danielle Jones Procedure Date: 01/19/2022 10:46 AM MRN: [id number] Endoscopist: Jackquline Denmark , MD, [id number] Age: 61 Referring MD:  Date of Birth: [birth year] Gender: Female Account #: [id number] Procedure:                Colonoscopy Indications:              Screening for colorectal malignant neoplasm Medicines:                Monitored Anesthesia Care Procedure:                Pre-Anesthesia Assessment:                           - Prior to the procedure, a History and Physical                            was performed, and patient medications and                            allergies were reviewed. The patient's tolerance of                            previous anesthesia was also reviewed. The risks                            and benefits of the procedure and the sedation                            options and risks were discussed with the patient.                            All questions were answered, and informed consent                            was obtained. Prior Anticoagulants: The patient has                            taken no anticoagulant or antiplatelet agents. ASA                            Grade Assessment: II - A patient with mild systemic                            disease. After reviewing the risks and benefits,                            the patient was deemed in satisfactory condition to                            undergo the procedure.                           After obtaining informed consent, the colonoscope  was passed under direct vision. Throughout the                            procedure, the patient's blood pressure, pulse, and                            oxygen saturations were monitored continuously. The                            CF [id number] #[id number] was introduced through the anus                            and advanced to the 2 cm into the ileum. The                            colonoscopy was  performed without difficulty. The                            patient tolerated the procedure well. The quality                            of the bowel preparation was good. The terminal                            ileum, ileocecal valve, appendiceal orifice, and                            rectum were photographed. Scope In: 10:54:57 AM Scope Out: 11:07:36 AM Scope Withdrawal Time: 0 hours 8 minutes 3 seconds  Total Procedure Duration: 0 hours 12 minutes 39 seconds  Findings:                 Multiple medium-mouthed diverticula were found in                            the sigmoid colon and descending colon. There was                            some degree of luminal narrowing in the sigmoid                            colon consistent with muscular hypertrophy. Sigmoid                            colon had "Swiss cheese appearance".                           Non-bleeding internal hemorrhoids were found during                            retroflexion. The hemorrhoids were small and Grade                            I (internal hemorrhoids that do not prolapse).  The terminal ileum appeared normal.                           The exam was otherwise without abnormality on                            direct and retroflexion views. Complications:            No immediate complications. Estimated Blood Loss:     Estimated blood loss: none. Impression:               - Moderate sigmoid diverticulosis.                           - Non-bleeding internal hemorrhoids.                           - The examined portion of the ileum was normal.                           - The examination was otherwise normal on direct                            and retroflexion views.                           - No specimens collected. Recommendation:           - Patient has a contact number available for                            emergencies. The signs and symptoms of potential                             delayed complications were discussed with the                            patient. Return to normal activities tomorrow.                            Written discharge instructions were provided to the                            patient.                           - High fiber diet.                           - Continue present medications.                           - Repeat colonoscopy in 10 years for screening                            purposes. Earlier, if with any new problems or  change in family history.                           - The findings and recommendations were discussed                            with the patient's family. Jackquline Denmark, MD 01/19/2022 11:11:16 AM This report has been signed electronically.

## 2022-01-19 NOTE — Progress Notes (Signed)
Pt resting comfortably. VSS. Airway intact. SBAR complete to RN. All questions answered.   

## 2022-01-20 ENCOUNTER — Telehealth: Payer: Self-pay | Admitting: *Deleted

## 2022-01-20 NOTE — Telephone Encounter (Signed)
  Follow up Call-     01/19/2022   10:33 AM  Call back number  Post procedure Call Back phone  # [phone]  Permission to leave phone message Yes     Patient questions:  Do you have a fever, pain , or abdominal swelling? No. Pain Score  0 *  Have you tolerated food without any problems? Yes.    Have you been able to return to your normal activities? Yes.    Do you have any questions about your discharge instructions: Diet   No. Medications  No. Follow up visit  No.  Do you have questions or concerns about your Care? No.  Actions: * If pain score is 4 or above: No action needed, pain <4.

## 2022-02-01 DIAGNOSIS — M1812 Unilateral primary osteoarthritis of first carpometacarpal joint, left hand: Secondary | ICD-10-CM | POA: Diagnosis not present

## 2022-02-08 ENCOUNTER — Inpatient Hospital Stay: Admission: RE | Admit: 2022-02-08 | Payer: Commercial Managed Care - HMO | Source: Ambulatory Visit

## 2022-02-17 DIAGNOSIS — M13842 Other specified arthritis, left hand: Secondary | ICD-10-CM | POA: Diagnosis not present

## 2022-03-08 ENCOUNTER — Ambulatory Visit
Admission: RE | Admit: 2022-03-08 | Discharge: 2022-03-08 | Disposition: A | Discharge status: Home / Self Care | Payer: Medicare HMO | Source: Ambulatory Visit | Attending: Nurse Practitioner | Admitting: Nurse Practitioner

## 2022-03-08 DIAGNOSIS — Z1231 Encounter for screening mammogram for malignant neoplasm of breast: Secondary | ICD-10-CM | POA: Diagnosis not present

## 2022-03-16 DIAGNOSIS — M25642 Stiffness of left hand, not elsewhere classified: Secondary | ICD-10-CM | POA: Diagnosis not present

## 2022-03-16 DIAGNOSIS — M13842 Other specified arthritis, left hand: Secondary | ICD-10-CM | POA: Diagnosis not present

## 2022-03-16 DIAGNOSIS — M79645 Pain in left finger(s): Secondary | ICD-10-CM | POA: Diagnosis not present

## 2022-03-26 NOTE — Progress Notes (Unsigned)
Subjective:  Patient ID: Danielle Jones, female    DOB: [birth year]  Age: 62 y.o. MRN: [id number]  No chief complaint on file.    History of Present illness:   Diabetes Mellitus Type II, Follow-up  Lab Results  Component Value Date   HGBA1C 6.2 (H) 11/23/2021   HGBA1C 6.2 (H) 12/22/2020   HGBA1C 6.1 (H) 09/15/2020   Wt Readings from Last 3 Encounters:  01/19/22 237 lb (107.5 kg)  01/02/22 237 lb (107.5 kg)  12/20/21 232 lb (105.2 kg)   Last seen for diabetes {1-12:18279} {days/wks/mos/yrs:310907} ago.  Management  includes ***. She reports {excellent/good/fair/poor:19665} compliance with treatment. She {is/is not:21021397} having side effects. {document side effects if present:1}  Home blood sugar records: {diabetes glucometry results:16657}  Episodes of hypoglycemia? {Yes/No:20286} {enter symptoms and frequency of symptoms if yes:1}   Current insulin regiment: {enter 'none' or type of insulin and number of units taken with each dose of each insulin formulation that the patient is taking:1} Most Recent Eye Exam: *** {Current exercise:16438:::1} {Current diet habits:16563:::1}  Pertinent Labs: Lab Results  Component Value Date   CHOL 168 11/23/2021   HDL 50 11/23/2021   LDLCALC 90 11/23/2021   TRIG 162 (H) 11/23/2021   CHOLHDL 3.4 11/23/2021   Lab Results  Component Value Date   NA 143 11/23/2021   K 4.1 11/23/2021   CREATININE 0.91 11/23/2021   EGFR 71 11/23/2021   GFRNONAA >60 05/10/2020   GLUCOSE 92 11/23/2021     She was last seen for hypertension {NUMBERS 1-12:18279} {days/wks/mos/yrs:310907} ago.  BP at that visit was ***. Management includes ***.  She reports {excellent/good/fair/poor:19665} compliance with treatment. She {is/is not:9024} having side effects. {document side effects if present:1} She is following a {diet:21022986} diet. She {is/is not:9024} exercising. She {does/does not:200015} smoke.  Use of agents associated with hypertension:  {bp agents assoc with hypertension:511::"none"}.   Outside blood pressures are {***enter patient reported home BP readings, or 'not being checked':1}.  Pertinent labs: Lab Results  Component Value Date   CHOL 168 11/23/2021   HDL 50 11/23/2021   LDLCALC 90 11/23/2021   TRIG 162 (H) 11/23/2021   CHOLHDL 3.4 11/23/2021   Lab Results  Component Value Date   NA 143 11/23/2021   K 4.1 11/23/2021   CREATININE 0.91 11/23/2021   EGFR 71 11/23/2021   GFRNONAA >60 05/10/2020   GLUCOSE 92 11/23/2021     The 10-year ASCVD risk score (Arnett DK, et al., 2019) is: 2.5%    Current Outpatient Medications on File Prior to Visit  Medication Sig Dispense Refill   cariprazine (VRAYLAR) 1.5 MG capsule Take 1 capsule (1.5 mg total) by mouth daily. 90 capsule 1   cyanocobalamin (VITAMIN B12) 1000 MCG/ML injection Inject one ml weekly for four weeks then inject one ml monthly 7 mL 0   ergocalciferol (VITAMIN D2) 1.25 MG (50000 UT) capsule TAKE 1 CAPSULE BY MOUTH 1 TIME A WEEK FOR 12 DOSES     levothyroxine (SYNTHROID) 50 MCG tablet Take 1 tablet (50 mcg total) by mouth daily. 90 tablet 1   metFORMIN (GLUCOPHAGE) 500 MG tablet Take 1 tablet (500 mg total) by mouth 2 (two) times daily with a meal. 180 tablet 3   Olmesartan-amLODIPine-HCTZ 40-10-25 MG TABS TAKE ONE (1) TABLET BY MOUTH EVERY DAY 90 tablet 1   venlafaxine XR (EFFEXOR-XR) 150 MG 24 hr capsule Take 1 capsule (150 mg total) by mouth daily with breakfast. 90 capsule 1   Current Facility-Administered Medications  on File Prior to Visit  Medication Dose Route Frequency Provider Last Rate Last Admin   0.9 %  sodium chloride infusion  500 mL Intravenous Once Jackquline Denmark, MD       Past Medical History:  Diagnosis Date   Anemia    "YOUNGER"   Anxiety    Arthritis    HANDS   CMC arthritis    left thumb   Depression    Gout    High cholesterol    Hypertension    Hypothyroidism    Sleep apnea    Past Surgical History:  Procedure  Laterality Date   ABDOMINAL HYSTERECTOMY     2007   Carpal tunnel  2019   CARPOMETACARPEL SUSPENSION PLASTY Left 05/12/2020   Procedure: LEFT THUMB CARPOMETACARPAL ARTHROPLASTY;  Surgeon: Leandrew Koyanagi, MD;  Location: [place];  Service: Orthopedics;  Laterality: Left;   thumb release  2019    Family History  Problem Relation Age of Onset   Arthritis Mother    Alzheimer's disease Mother    Kidney failure Mother    Congenital heart disease Father    Melanoma Father    Healthy Son    Healthy Son    Blindness Son    Mental illness Son    Colon cancer Neg Hx    Colon polyps Neg Hx    Crohn's disease Neg Hx    Esophageal cancer Neg Hx    Stomach cancer Neg Hx    Rectal cancer Neg Hx    Ulcerative colitis Neg Hx    Breast cancer Neg Hx    Social History   Socioeconomic History   Marital status: Single    Spouse name: Not on file   Number of children: Not on file   Years of education: Not on file   Highest education level: Not on file  Occupational History   Not on file  Tobacco Use   Smoking status: Never    Passive exposure: Past   Smokeless tobacco: Never  Vaping Use   Vaping Use: Never used  Substance and Sexual Activity   Alcohol use: Yes    Comment: social   Drug use: No   Sexual activity: Not on file  Other Topics Concern   Not on file  Social History Narrative   Not on file   Social Determinants of Health   Financial Resource Strain: Not on file  Food Insecurity: Not on file  Transportation Needs: Not on file  Physical Activity: Not on file  Stress: Not on file  Social Connections: Not on file    Review of Systems   Objective:  There were no vitals taken for this visit.     01/19/2022   11:22 AM 01/19/2022   11:12 AM 01/19/2022   11:08 AM  BP/Weight  Systolic BP [id number]  Diastolic BP 56 52 61    Physical Exam  Diabetic Foot Exam - Simple   No data filed        12/20/2021   12:01 PM 11/23/2021   11:26 AM  04/01/2021    9:03 AM 09/15/2020    9:08 AM 03/18/2020    8:50 AM  Depression screen PHQ 2/9  Decreased Interest '2 3 2 1 2  '$ Down, Depressed, Hopeless '2 2 2 1 3  '$ PHQ - 2 Score '4 5 4 2 5  '$ Altered sleeping '3 3 3 3 1  '$ Tired, decreased energy '3 3 3 3 3  '$ Change in  appetite 2 0 '3 3 1  '$ Feeling bad or failure about yourself  1 2 0 3 2  Trouble concentrating '1 2 2 3 2  '$ Moving slowly or fidgety/restless 0 0 1 0 1  Suicidal thoughts 0 0 0 0 1  PHQ-9 Score '14 15 16 17 16  '$ Difficult doing work/chores Not difficult at all Very difficult  Somewhat difficult Not difficult at all       02/05/2020   10:18 AM 03/18/2020    8:42 AM 05/12/2020   10:17 AM 04/01/2021    9:03 AM  Fall Risk  Falls in the past year? 1 0  0  Was there an injury with Fall? 0 0  0  Fall Risk Category Calculator 2 0  0  Fall Risk Category (Retired) Moderate Low  Low  (RETIRED) Patient Fall Risk Level  Low fall risk Low fall risk Low fall risk    Lab Results  Component Value Date   WBC 6.4 11/23/2021   HGB 12.0 11/23/2021   HCT 37.9 11/23/2021   PLT 294 11/23/2021   GLUCOSE 92 11/23/2021   CHOL 168 11/23/2021   TRIG 162 (H) 11/23/2021   HDL 50 11/23/2021   LDLCALC 90 11/23/2021   ALT 27 11/23/2021   AST 21 11/23/2021   NA 143 11/23/2021   K 4.1 11/23/2021   CL 99 11/23/2021   CREATININE 0.91 11/23/2021   BUN 12 11/23/2021   CO2 27 11/23/2021   TSH 1.610 11/23/2021   HGBA1C 6.2 (H) 11/23/2021      Assessment & Plan:   There are no diagnoses linked to this encounter.   Follow-up: No follow-ups on file.  An After Visit Summary was printed and given to the patient.  I, Neil Crouch have reviewed all documentation for this visit. The documentation on 03/26/22   for the exam, diagnosis, procedures, and orders are all accurate and complete.    Neil Crouch, DNP, [facility] [phone]

## 2022-03-27 ENCOUNTER — Encounter: Payer: Self-pay | Admitting: Nurse Practitioner

## 2022-03-27 ENCOUNTER — Ambulatory Visit (INDEPENDENT_AMBULATORY_CARE_PROVIDER_SITE_OTHER): Payer: Medicare HMO | Admitting: Nurse Practitioner

## 2022-03-27 VITALS — BP 124/70 | HR 79 | Temp 97.0°F | Resp 18 | Ht 63.0 in | Wt 232.8 lb

## 2022-03-27 DIAGNOSIS — I10 Essential (primary) hypertension: Secondary | ICD-10-CM | POA: Diagnosis not present

## 2022-03-27 DIAGNOSIS — E782 Mixed hyperlipidemia: Secondary | ICD-10-CM

## 2022-03-27 DIAGNOSIS — R69 Illness, unspecified: Secondary | ICD-10-CM | POA: Diagnosis not present

## 2022-03-27 DIAGNOSIS — M25561 Pain in right knee: Secondary | ICD-10-CM | POA: Diagnosis not present

## 2022-03-27 DIAGNOSIS — E039 Hypothyroidism, unspecified: Secondary | ICD-10-CM

## 2022-03-27 DIAGNOSIS — R7989 Other specified abnormal findings of blood chemistry: Secondary | ICD-10-CM | POA: Diagnosis not present

## 2022-03-27 DIAGNOSIS — I159 Secondary hypertension, unspecified: Secondary | ICD-10-CM | POA: Diagnosis not present

## 2022-03-27 DIAGNOSIS — F325 Major depressive disorder, single episode, in full remission: Secondary | ICD-10-CM

## 2022-03-27 MED ORDER — VITAMIN B-12 1000 MCG PO TABS: 1000.0000 ug | ORAL_TABLET | Freq: Every day | ORAL | 0 refills | Status: AC

## 2022-03-27 MED ORDER — LEVOTHYROXINE SODIUM 50 MCG PO TABS: 50.0000 ug | ORAL_TABLET | Freq: Every day | ORAL | 1 refills | Status: DC

## 2022-03-27 MED ORDER — MELOXICAM 7.5 MG PO TABS: 7.5000 mg | ORAL_TABLET | Freq: Every day | ORAL | 0 refills | Status: DC

## 2022-03-27 MED ORDER — OLMESARTAN-AMLODIPINE-HCTZ 40-10-25 MG PO TABS: ORAL_TABLET | ORAL | 1 refills | Status: DC

## 2022-03-27 MED ORDER — CALCIUM-VITAMIN D 600-5 MG-MCG PO TABS: 1.0000 | ORAL_TABLET | Freq: Every day | ORAL | 1 refills | Status: AC

## 2022-03-27 MED ORDER — BUPROPION HCL ER (XL) 150 MG PO TB24: 150.0000 mg | ORAL_TABLET | Freq: Every day | ORAL | 1 refills | Status: DC

## 2022-03-27 MED ORDER — VENLAFAXINE HCL ER 150 MG PO CP24: 150.0000 mg | ORAL_CAPSULE | Freq: Every day | ORAL | 1 refills | Status: DC

## 2022-03-27 MED ORDER — DICLOFENAC SODIUM 1 % EX GEL: 4.0000 g | Freq: Four times a day (QID) | CUTANEOUS | Status: DC

## 2022-03-27 NOTE — Assessment & Plan Note (Signed)
Continue taking synthroid 50 mcg daily  Will check TSH level

## 2022-03-27 NOTE — Assessment & Plan Note (Signed)
Continue Meloxicam and Diclofenac gel Will follow up with Knee X-ray and go from there Possible orthopedics referral and physical therapy

## 2022-03-27 NOTE — Assessment & Plan Note (Signed)
Well controlled with Olmesartan-amlodipine-HYDROCHLOROTHIAZIDE 40-10-25 mg daily Lab work will be done Nutrition: Stressed importance of moderation in sodium intake, saturated fat and cholesterol, caloric balance, sufficient intake of complex carbohydrates, fiber, calcium and iron.   Exercise: Stressed the importance of regular exercise.

## 2022-03-27 NOTE — Assessment & Plan Note (Signed)
PHQ 20 GAD 17 Continue Venlafaxine  Added Wellbutrin 150 XL daily

## 2022-03-27 NOTE — Patient Instructions (Signed)
Follow up in 3 months Will call you regarding Knee X-ray Please let us know how wellbutrin works for you in a month to send refill Keep taking Meloxicam as needed for knee pain     Bupropion Tablets (Depression/Mood Disorders) What is this medication? BUPROPION (byoo PROE pee on) treats depression. It increases norepinephrine and dopamine in the brain, hormones that help regulate mood. It belongs to a group of medications called NDRIs. This medicine may be used for other purposes; ask your health care provider or pharmacist if you have questions. COMMON BRAND NAME(S): Wellbutrin What should I tell my care team before I take this medication? They need to know if you have any of these conditions: An eating disorder, such as anorexia or bulimia Bipolar disorder or psychosis Diabetes or high blood sugar, treated with medication Glaucoma Heart disease, previous heart attack, or irregular heart beat Head injury or brain tumor High blood pressure Kidney or liver disease Seizures Suicidal thoughts or a previous suicide attempt Tourette's syndrome Weight loss An unusual or allergic reaction to bupropion, other medications, foods, dyes, or preservatives Pregnant or trying to become pregnant Breast-feeding How should I use this medication? Take this medication by mouth with a glass of water. Follow the directions on the prescription label. You can take it with or without food. If it upsets your stomach, take it with food. Take your medication at regular intervals. Do not take your medication more often than directed. Do not stop taking this medication suddenly except upon the advice of your care team. Stopping this medication too quickly may cause serious side effects or your condition may worsen. A special MedGuide will be given to you by the pharmacist with each prescription and refill. Be sure to read this information carefully each time. Talk to your care team regarding the use of this  medication in children. Special care may be needed. Overdosage: If you think you have taken too much of this medicine contact a poison control center or emergency room at once. NOTE: This medicine is only for you. Do not share this medicine with others. What if I miss a dose? If you miss a dose, take it as soon as you can. If it is less than four hours to your next dose, take only that dose and skip the missed dose. Do not take double or extra doses. What may interact with this medication? Do not take this medication with any of the following: Linezolid MAOIs like Azilect, Carbex, Eldepryl, Marplan, Nardil, and Parnate Methylene blue (injected into a vein) Other medications that contain bupropion like Zyban This medication may also interact with the following: Alcohol Certain medications for anxiety or sleep Certain medications for blood pressure like metoprolol, propranolol Certain medications for depression or psychotic disturbances Certain medications for HIV or AIDS like efavirenz, lopinavir, nelfinavir, ritonavir Certain medications for irregular heart beat like propafenone, flecainide Certain medications for Parkinson's disease like amantadine, levodopa Certain medications for seizures like carbamazepine, phenytoin, phenobarbital Cimetidine Clopidogrel Cyclophosphamide Digoxin Furazolidone Isoniazid Nicotine Orphenadrine Procarbazine Steroid medications like prednisone or cortisone Stimulant medications for attention disorders, weight loss, or to stay awake Tamoxifen Theophylline Thiotepa Ticlopidine Tramadol Warfarin This list may not describe all possible interactions. Give your health care provider a list of all the medicines, herbs, non-prescription drugs, or dietary supplements you use. Also tell them if you smoke, drink alcohol, or use illegal drugs. Some items may interact with your medicine. What should I watch for while using this medication? Tell your care  team  if your symptoms do not get better or if they get worse. Visit your care team for regular checks on your progress. Because it may take several weeks to see the full effects of this medication, it is important to continue your treatment as prescribed. Watch for new or worsening thoughts of suicide or depression. This includes sudden changes in mood, behavior, or thoughts. These changes can happen at any time but are more common in the beginning of treatment or after a change in dose. Call your care team right away if you experience these thoughts or worsening depression. Manic episodes may happen in patients with bipolar disorder who take this medication. Watch for changes in feelings or behaviors such as feeling anxious, nervous, agitated, panicky, irritable, hostile, aggressive, impulsive, severely restless, overly excited and hyperactive, or trouble sleeping. These symptoms can happen at anytime but are more common in the beginning of treatment or after a change in dose. Call your care team right away if you notice any of these symptoms. This medication may cause serious skin reactions. They can happen weeks to months after starting the medication. Contact your care team right away if you notice fevers or flu-like symptoms with a rash. The rash may be red or purple and then turn into blisters or peeling of the skin. Or, you might notice a red rash with swelling of the face, lips or lymph nodes in your neck or under your arms. Avoid drinks that contain alcohol while taking this medication. Drinking large amounts of alcohol, using sleeping or anxiety medications, or quickly stopping the use of these agents while taking this medication may increase your risk for a seizure. Do not drive or use heavy machinery until you know how this medication affects you. This medication can impair your ability to perform these tasks. Do not take this medication close to bedtime. It may prevent you from sleeping. Your mouth  may get dry. Chewing sugarless gum or sucking hard candy, and drinking plenty of water may help. Contact your care team if the problem does not go away or is severe. What side effects may I notice from receiving this medication? Side effects that you should report to your care team as soon as possible: Allergic reactions--skin rash, itching, hives, swelling of the face, lips, tongue, or throat Increase in blood pressure Mood and behavior changes--anxiety, nervousness, confusion, hallucinations, irritability, hostility, thoughts of suicide or self-harm, worsening mood, feelings of depression Redness, blistering, peeling, or loosening of the skin, including inside the mouth Seizures Sudden eye pain or change in vision such as blurry vision, seeing halos around lights, vision loss Side effects that usually do not require medical attention (report to your care team if they continue or are bothersome): Constipation Dizziness Dry mouth Loss of appetite Nausea Tremors or shaking Trouble sleeping This list may not describe all possible side effects. Call your doctor for medical advice about side effects. You may report side effects to FDA at 1-800-FDA-1088. Where should I keep my medication? Keep out of the reach of children and pets. Store at room temperature between 20 and 25 degrees C (68 and 77 degrees F), away from direct sunlight and moisture. Keep tightly closed. Throw away any unused medication after the expiration date. NOTE: This sheet is a summary. It may not cover all possible information. If you have questions about this medicine, talk to your doctor, pharmacist, or health care provider.  2023 Elsevier/Gold Standard (2020-01-19 00:00:00) eloxicam for your right knee pain

## 2022-03-27 NOTE — Assessment & Plan Note (Signed)
Continue taking Vitamin B12 daily

## 2022-03-27 NOTE — Assessment & Plan Note (Signed)
Continue Calcium and vitamin D daily

## 2022-03-27 NOTE — Assessment & Plan Note (Signed)
Continue working on diet and exercise Lipid level will be checked today  Nutrition: Stressed importance of moderation in sodium intake, saturated fat and cholesterol, caloric balance, sufficient intake of complex carbohydrates, fiber, calcium and iron.   Exercise: Stressed the importance of regular exercise.

## 2022-03-28 LAB — COMPREHENSIVE METABOLIC PANEL
ALT: 27 IU/L (ref 0–32)
AST: 17 IU/L (ref 0–40)
Albumin/Globulin Ratio: 1.8 (ref 1.2–2.2)
Albumin: 4.4 g/dL (ref 3.9–4.9)
Alkaline Phosphatase: 135 IU/L — ABNORMAL HIGH (ref 44–121)
BUN/Creatinine Ratio: 14 (ref 12–28)
BUN: 13 mg/dL (ref 8–27)
Bilirubin Total: 0.3 mg/dL (ref 0.0–1.2)
CO2: 26 mmol/L (ref 20–29)
Calcium: 10 mg/dL (ref 8.7–10.3)
Chloride: 99 mmol/L (ref 96–106)
Creatinine, Ser: 0.91 mg/dL (ref 0.57–1.00)
Globulin, Total: 2.4 g/dL (ref 1.5–4.5)
Glucose: 105 mg/dL — ABNORMAL HIGH (ref 70–99)
Potassium: 4.4 mmol/L (ref 3.5–5.2)
Sodium: 143 mmol/L (ref 134–144)
Total Protein: 6.8 g/dL (ref 6.0–8.5)
eGFR: 71 mL/min/{1.73_m2} (ref >59)

## 2022-03-28 LAB — CBC WITH DIFFERENTIAL/PLATELET
Basophils Absolute: 0.1 x10E3/uL (ref 0.0–0.2)
Basos: 1 %
EOS (ABSOLUTE): 0.2 x10E3/uL (ref 0.0–0.4)
Eos: 2 %
Hematocrit: 37.5 % (ref 34.0–46.6)
Hemoglobin: 12.1 g/dL (ref 11.1–15.9)
Immature Grans (Abs): 0.1 x10E3/uL (ref 0.0–0.1)
Immature Granulocytes: 1 %
Lymphocytes Absolute: 2.1 x10E3/uL (ref 0.7–3.1)
Lymphs: 31 %
MCH: 28.4 pg (ref 26.6–33.0)
MCHC: 32.3 g/dL (ref 31.5–35.7)
MCV: 88 fL (ref 79–97)
Monocytes Absolute: 0.6 x10E3/uL (ref 0.1–0.9)
Monocytes: 9 %
Neutrophils Absolute: 3.9 x10E3/uL (ref 1.4–7.0)
Neutrophils: 56 %
Platelets: 282 x10E3/uL (ref 150–450)
RBC: 4.26 x10E6/uL (ref 3.77–5.28)
RDW: 13.7 % (ref 11.7–15.4)
WBC: 6.9 x10E3/uL (ref 3.4–10.8)

## 2022-03-28 LAB — CARDIOVASCULAR RISK ASSESSMENT

## 2022-03-28 LAB — LIPID PANEL
Chol/HDL Ratio: 3.3 ratio (ref 0.0–4.4)
Cholesterol, Total: 163 mg/dL (ref 100–199)
HDL: 50 mg/dL (ref >39)
LDL Chol Calc (NIH): 89 mg/dL (ref 0–99)
Triglycerides: 138 mg/dL (ref 0–149)
VLDL Cholesterol Cal: 24 mg/dL (ref 5–40)

## 2022-03-28 LAB — TSH: TSH: 2.96 u[IU]/mL (ref 0.450–4.500)

## 2022-03-28 LAB — VITAMIN B12: Vitamin B-12: 478 pg/mL (ref 232–1245)

## 2022-03-29 ENCOUNTER — Other Ambulatory Visit: Payer: Self-pay | Admitting: Nurse Practitioner

## 2022-03-29 DIAGNOSIS — M1711 Unilateral primary osteoarthritis, right knee: Secondary | ICD-10-CM

## 2022-03-30 DIAGNOSIS — Z791 Long term (current) use of non-steroidal anti-inflammatories (NSAID): Secondary | ICD-10-CM | POA: Diagnosis not present

## 2022-03-30 DIAGNOSIS — E039 Hypothyroidism, unspecified: Secondary | ICD-10-CM | POA: Diagnosis not present

## 2022-03-30 DIAGNOSIS — Z7984 Long term (current) use of oral hypoglycemic drugs: Secondary | ICD-10-CM | POA: Diagnosis not present

## 2022-03-30 DIAGNOSIS — E785 Hyperlipidemia, unspecified: Secondary | ICD-10-CM | POA: Diagnosis not present

## 2022-03-30 DIAGNOSIS — Z6841 Body Mass Index (BMI) 40.0 and over, adult: Secondary | ICD-10-CM | POA: Diagnosis not present

## 2022-03-30 DIAGNOSIS — M1990 Unspecified osteoarthritis, unspecified site: Secondary | ICD-10-CM | POA: Diagnosis not present

## 2022-03-30 DIAGNOSIS — R69 Illness, unspecified: Secondary | ICD-10-CM | POA: Diagnosis not present

## 2022-03-30 DIAGNOSIS — E119 Type 2 diabetes mellitus without complications: Secondary | ICD-10-CM | POA: Diagnosis not present

## 2022-03-30 DIAGNOSIS — Z008 Encounter for other general examination: Secondary | ICD-10-CM | POA: Diagnosis not present

## 2022-03-30 DIAGNOSIS — I10 Essential (primary) hypertension: Secondary | ICD-10-CM | POA: Diagnosis not present

## 2022-03-30 DIAGNOSIS — Z8249 Family history of ischemic heart disease and other diseases of the circulatory system: Secondary | ICD-10-CM | POA: Diagnosis not present

## 2022-04-05 ENCOUNTER — Other Ambulatory Visit: Payer: Self-pay

## 2022-04-05 DIAGNOSIS — M1711 Unilateral primary osteoarthritis, right knee: Secondary | ICD-10-CM

## 2022-04-12 DIAGNOSIS — M1711 Unilateral primary osteoarthritis, right knee: Secondary | ICD-10-CM | POA: Diagnosis not present

## 2022-04-12 DIAGNOSIS — M25561 Pain in right knee: Secondary | ICD-10-CM | POA: Diagnosis not present

## 2022-04-13 DIAGNOSIS — M13842 Other specified arthritis, left hand: Secondary | ICD-10-CM | POA: Diagnosis not present

## 2022-04-24 ENCOUNTER — Ambulatory Visit (INDEPENDENT_AMBULATORY_CARE_PROVIDER_SITE_OTHER): Payer: Medicare HMO | Admitting: Physician Assistant

## 2022-04-24 ENCOUNTER — Encounter: Payer: Self-pay | Admitting: Physician Assistant

## 2022-04-24 VITALS — BP 128/66 | HR 76 | Temp 97.8°F | Ht 63.0 in | Wt 228.0 lb

## 2022-04-24 DIAGNOSIS — L03113 Cellulitis of right upper limb: Secondary | ICD-10-CM

## 2022-04-24 DIAGNOSIS — L239 Allergic contact dermatitis, unspecified cause: Secondary | ICD-10-CM | POA: Diagnosis not present

## 2022-04-24 MED ORDER — CEPHALEXIN 500 MG PO CAPS: 500.0000 mg | ORAL_CAPSULE | Freq: Four times a day (QID) | ORAL | 0 refills | Status: DC

## 2022-04-24 MED ORDER — TRIAMCINOLONE ACETONIDE 0.1 % EX CREA: 1.0000 | TOPICAL_CREAM | Freq: Two times a day (BID) | CUTANEOUS | 0 refills | Status: DC

## 2022-04-24 MED ORDER — PREDNISONE 20 MG PO TABS: ORAL_TABLET | ORAL | 0 refills | Status: AC

## 2022-04-24 NOTE — Progress Notes (Signed)
Acute Office Visit  Subjective:    Patient ID: Danielle Jones, female    DOB: 21-Feb-1960, 62 y.o.   MRN: [id number]  Chief Complaint  Patient presents with   Bug bite    Right forearm/right side back    HPI: Patient is in today for complaints of possible spider bite on right arm and right back - states started last week and have worsened - red, inflamed and very pruritic Denies fever/malaise  Past Medical History:  Diagnosis Date   Anemia    "YOUNGER"   Anxiety    Arthritis    HANDS   CMC arthritis    left thumb   Depression    Gout    High cholesterol    Hypertension    Hypothyroidism    Secondary hypertension 02/05/2020   Sleep apnea     Past Surgical History:  Procedure Laterality Date   ABDOMINAL HYSTERECTOMY     2007   Carpal tunnel  2019   CARPOMETACARPEL SUSPENSION PLASTY Left 05/12/2020   Procedure: LEFT THUMB CARPOMETACARPAL ARTHROPLASTY;  Surgeon: Leandrew Koyanagi, MD;  Location: [facility];  Service: Orthopedics;  Laterality: Left;   thumb release  2019    Family History  Problem Relation Age of Onset   Arthritis Mother    Alzheimer's disease Mother    Kidney failure Mother    Congenital heart disease Father    Melanoma Father    Healthy Son    Healthy Son    Blindness Son    Mental illness Son    Colon cancer Neg Hx    Colon polyps Neg Hx    Crohn's disease Neg Hx    Esophageal cancer Neg Hx    Stomach cancer Neg Hx    Rectal cancer Neg Hx    Ulcerative colitis Neg Hx    Breast cancer Neg Hx     Social History   Socioeconomic History   Marital status: Single    Spouse name: Not on file   Number of children: Not on file   Years of education: Not on file   Highest education level: Not on file  Occupational History   Not on file  Tobacco Use   Smoking status: Never    Passive exposure: Past   Smokeless tobacco: Never  Vaping Use   Vaping Use: Never used  Substance and Sexual Activity   Alcohol use: Yes     Comment: social   Drug use: No   Sexual activity: Not on file  Other Topics Concern   Not on file  Social History Narrative   Not on file   Social Determinants of Health   Financial Resource Strain: Not on file  Food Insecurity: Not on file  Transportation Needs: Not on file  Physical Activity: Not on file  Stress: Not on file  Social Connections: Not on file  Intimate Partner Violence: Not on file    Outpatient Medications Prior to Visit  Medication Sig Dispense Refill   atorvastatin (LIPITOR) 40 MG tablet Take 40 mg by mouth daily.     buPROPion (WELLBUTRIN XL) 150 MG 24 hr tablet Take 1 tablet (150 mg total) by mouth daily. 30 tablet 1   Calcium-Vitamin D 600-5 MG-MCG TABS Take 1 each by mouth daily. 90 tablet 1   cyanocobalamin (VITAMIN B12) 1000 MCG tablet Take 1 tablet (1,000 mcg total) by mouth daily. 90 tablet 0   diclofenac Sodium (VOLTAREN) 1 % GEL Apply 4 g  topically 4 (four) times daily. 4 g G   ergocalciferol (VITAMIN D2) 1.25 MG (50000 UT) capsule TAKE 1 CAPSULE BY MOUTH 1 TIME A WEEK FOR 12 DOSES     levothyroxine (SYNTHROID) 50 MCG tablet Take 1 tablet (50 mcg total) by mouth daily. 90 tablet 1   meloxicam (MOBIC) 15 MG tablet Take 15 mg by mouth daily.     metFORMIN (GLUCOPHAGE) 500 MG tablet Take 1 tablet (500 mg total) by mouth 2 (two) times daily with a meal. 180 tablet 3   Olmesartan-amLODIPine-HCTZ 40-10-25 MG TABS TAKE ONE (1) TABLET BY MOUTH EVERY DAY 90 tablet 1   venlafaxine XR (EFFEXOR-XR) 150 MG 24 hr capsule Take 1 capsule (150 mg total) by mouth daily with breakfast. 90 capsule 1   meloxicam (MOBIC) 7.5 MG tablet Take 1 tablet (7.5 mg total) by mouth daily. 30 tablet 0   0.9 %  sodium chloride infusion      No facility-administered medications prior to visit.    No Known Allergies  Review of Systems    CONSTITUTIONAL: Negative for chills, fatigue, fever,  CARDIOVASCULAR: Negative for chest pain, dizziness RESPIRATORY: Negative for recent cough  and dyspnea.  INTEGUMENTARY see HPI Objective:    PHYSICAL EXAM:   VS: BP 128/66 (BP Location: Left Arm, Patient Position: Sitting)   Pulse 76   Temp 97.8 F (36.6 C) (Temporal)   Ht 5\' 3"  (1.6 m)   Wt 228 lb (103.4 kg)   SpO2 99%   BMI 40.39 kg/m   GEN: Well nourished, well developed, in no acute distress  Cardiac: RRR; no murmurs, Respiratory:  normal respiratory rate and pattern with no distress - normal breath sounds with no rales, rhonchi, wheezes or rubs Skin: :right arm with what appears to be central bite to forearm with thickened redness extending to upper arm --- similar appearance of same type of reaction on right side   Health Maintenance Due  Topic Date Due   Medicare Annual Wellness (AWV)  Never done   HIV Screening  Never done   Hepatitis C Screening  Never done   DTaP/Tdap/Td (1 - Tdap) Never done   PAP SMEAR-Modifier  Never done   COVID-19 Vaccine (3 - Pfizer risk series) 08/15/2019    There are no preventive care reminders to display for this patient.   Lab Results  Component Value Date   TSH 2.960 03/27/2022   Lab Results  Component Value Date   WBC 6.9 03/27/2022   HGB 12.1 03/27/2022   HCT 37.5 03/27/2022   MCV 88 03/27/2022   PLT 282 03/27/2022   Lab Results  Component Value Date   NA 143 03/27/2022   K 4.4 03/27/2022   CO2 26 03/27/2022   GLUCOSE 105 (H) 03/27/2022   BUN 13 03/27/2022   CREATININE 0.91 03/27/2022   BILITOT 0.3 03/27/2022   ALKPHOS 135 (H) 03/27/2022   AST 17 03/27/2022   ALT 27 03/27/2022   PROT 6.8 03/27/2022   ALBUMIN 4.4 03/27/2022   CALCIUM 10.0 03/27/2022   ANIONGAP 8 05/10/2020   EGFR 71 03/27/2022   Lab Results  Component Value Date   CHOL 163 03/27/2022   Lab Results  Component Value Date   HDL 50 03/27/2022   Lab Results  Component Value Date   LDLCALC 89 03/27/2022   Lab Results  Component Value Date   TRIG 138 03/27/2022   Lab Results  Component Value Date   CHOLHDL 3.3 03/27/2022    Lab  Results  Component Value Date   HGBA1C 6.2 (H) 11/23/2021       Assessment & Plan:  Cellulitis of right upper extremity -     Cephalexin; Take 1 capsule (500 mg total) by mouth 4 (four) times daily.  Dispense: 20 capsule; Refill: 0 -     predniSONE; Take 3 tablets (60 mg total) by mouth daily with breakfast for 3 days, THEN 2 tablets (40 mg total) daily with breakfast for 3 days, THEN 1 tablet (20 mg total) daily with breakfast for 3 days.  Dispense: 18 tablet; Refill: 0 -     Triamcinolone Acetonide; Apply 1 Application topically 2 (two) times daily.  Dispense: 30 g; Refill: 0  Allergic dermatitis -     Cephalexin; Take 1 capsule (500 mg total) by mouth 4 (four) times daily.  Dispense: 20 capsule; Refill: 0 -     predniSONE; Take 3 tablets (60 mg total) by mouth daily with breakfast for 3 days, THEN 2 tablets (40 mg total) daily with breakfast for 3 days, THEN 1 tablet (20 mg total) daily with breakfast for 3 days.  Dispense: 18 tablet; Refill: 0 -     Triamcinolone Acetonide; Apply 1 Application topically 2 (two) times daily.  Dispense: 30 g; Refill: 0     Meds ordered this encounter  Medications   cephALEXin (KEFLEX) 500 MG capsule    Sig: Take 1 capsule (500 mg total) by mouth 4 (four) times daily.    Dispense:  20 capsule    Refill:  0    Order Specific Question:   Supervising Provider    Answer:   Shelton Silvas   predniSONE (DELTASONE) 20 MG tablet    Sig: Take 3 tablets (60 mg total) by mouth daily with breakfast for 3 days, THEN 2 tablets (40 mg total) daily with breakfast for 3 days, THEN 1 tablet (20 mg total) daily with breakfast for 3 days.    Dispense:  18 tablet    Refill:  0    Order Specific Question:   Supervising Provider    Answer:   Shelton Silvas   triamcinolone cream (KENALOG) 0.1 %    Sig: Apply 1 Application topically 2 (two) times daily.    Dispense:  30 g    Refill:  0    Order Specific Question:   Supervising Provider    Answer:    Shelton Silvas    No orders of the defined types were placed in this encounter.    Follow-up: Return if symptoms worsen or fail to improve.  An After Visit Summary was printed and given to the patient.  Yetta Flock [facility] [phone]

## 2022-05-10 DIAGNOSIS — M1711 Unilateral primary osteoarthritis, right knee: Secondary | ICD-10-CM | POA: Diagnosis not present

## 2022-05-11 DIAGNOSIS — M13842 Other specified arthritis, left hand: Secondary | ICD-10-CM | POA: Diagnosis not present

## 2022-05-18 ENCOUNTER — Other Ambulatory Visit: Payer: Self-pay

## 2022-05-18 DIAGNOSIS — F325 Major depressive disorder, single episode, in full remission: Secondary | ICD-10-CM

## 2022-05-18 MED ORDER — BUPROPION HCL ER (XL) 150 MG PO TB24: 150.0000 mg | ORAL_TABLET | Freq: Every day | ORAL | 1 refills | Status: DC

## 2022-05-19 ENCOUNTER — Other Ambulatory Visit: Payer: Self-pay

## 2022-05-19 MED ORDER — ATORVASTATIN CALCIUM 40 MG PO TABS: 40.0000 mg | ORAL_TABLET | Freq: Every day | ORAL | 0 refills | Status: DC

## 2022-06-13 ENCOUNTER — Telehealth: Payer: Self-pay

## 2022-06-13 NOTE — Telephone Encounter (Signed)
Patient is only using [facility] per insurance now.

## 2022-07-06 ENCOUNTER — Ambulatory Visit (INDEPENDENT_AMBULATORY_CARE_PROVIDER_SITE_OTHER): Payer: Medicare HMO | Admitting: Physician Assistant

## 2022-07-06 ENCOUNTER — Encounter: Payer: Self-pay | Admitting: Physician Assistant

## 2022-07-06 VITALS — BP 120/68 | HR 84 | Temp 97.8°F | Ht 63.0 in | Wt 223.0 lb

## 2022-07-06 DIAGNOSIS — E039 Hypothyroidism, unspecified: Secondary | ICD-10-CM

## 2022-07-06 DIAGNOSIS — R7989 Other specified abnormal findings of blood chemistry: Secondary | ICD-10-CM

## 2022-07-06 DIAGNOSIS — Z90710 Acquired absence of both cervix and uterus: Secondary | ICD-10-CM | POA: Diagnosis not present

## 2022-07-06 DIAGNOSIS — R7303 Prediabetes: Secondary | ICD-10-CM | POA: Diagnosis not present

## 2022-07-06 DIAGNOSIS — I10 Essential (primary) hypertension: Secondary | ICD-10-CM | POA: Diagnosis not present

## 2022-07-06 DIAGNOSIS — E782 Mixed hyperlipidemia: Secondary | ICD-10-CM

## 2022-07-06 NOTE — Progress Notes (Signed)
Subjective:  Patient ID: Danielle Jones, female    DOB: [birth year]  Age: 62 y.o. MRN: [id number]  Chief Complaint  Patient presents with   Hypertension   Hyperlipidemia    HPI Mixed hyperlipidemia  Pt presents with hyperlipidemia. Patient was diagnosed  more than 10 years ago - Compliance with treatment has been good -The patient is compliant with medications, maintains a low cholesterol diet , follows up as directed , and maintains an exercise regimen . The patient denies experiencing any hypercholesterolemia related symptoms.  Currently taking lipitor 40mg  qd  Pt presents for follow up of hypertension. Patient was diagnosed more than 20 years ago - The patient is tolerating the medication well without side effects. Compliance with treatment has been good; including taking medication as directed , maintains a healthy diet and regular exercise regimen , and following up as directed. Currently taking olmesartan/amlodipine/hctz  Pt with history of anxiety and depression - has been on Effexor and wellbutrin for several years - states these medications work well for her and not having any breakthrough symptoms  Pt with history of prediabetes - currently taking glucophage 500mg  bid - states does not check glucose regulary  Pt with history of hypothyroidism - currently on syntrhoid qd       07/06/2022   10:07 AM 03/27/2022   10:14 AM 03/27/2022    9:21 AM 12/20/2021   12:01 PM 11/23/2021   11:26 AM  Depression screen PHQ 2/9  Decreased Interest 2 3 3 2 3   Down, Depressed, Hopeless 1 3 3 2 2   PHQ - 2 Score 3 6 6 4 5   Altered sleeping 1 2 1 3 3   Tired, decreased energy 2 3 3 3 3   Change in appetite 1 3 3 2  0  Feeling bad or failure about yourself  1 3 3 1 2   Trouble concentrating 1 2 1 1 2   Moving slowly or fidgety/restless 0 1 0 0 0  Suicidal thoughts 0 0 0 0 0  PHQ-9 Score 9 20 17 14 15   Difficult doing work/chores Somewhat difficult Very difficult Very difficult Not  difficult at all Very difficult        02/05/2020   10:18 AM 03/18/2020    8:42 AM 05/12/2020   10:17 AM 04/01/2021    9:03 AM 07/06/2022   10:07 AM  Fall Risk  Falls in the past year? 1 0  0 0  Was there an injury with Fall? 0 0  0 0  Fall Risk Category Calculator 2 0  0 0  Fall Risk Category (Retired) Moderate Low  Low   (RETIRED) Patient Fall Risk Level  Low fall risk Low fall risk Low fall risk   Patient at Risk for Falls Due to     No Fall Risks  Fall risk Follow up     Falls evaluation completed     ROS CONSTITUTIONAL: Negative for chills, fatigue, fever, unintentional weight gain and unintentional weight loss.  E/N/T: Negative for ear pain, nasal congestion and sore throat.  CARDIOVASCULAR: Negative for chest pain, dizziness, palpitations and pedal edema.  RESPIRATORY: Negative for recent cough and dyspnea.  GASTROINTESTINAL: Negative for abdominal pain, acid reflux symptoms, constipation, diarrhea, nausea and vomiting.  MSK: Negative for arthralgias and myalgias.  INTEGUMENTARY: Negative for rash.  NEUROLOGICAL: Negative for dizziness and headaches.  PSYCHIATRIC: Negative for sleep disturbance and to question depression screen.  Negative for depression, negative for anhedonia.    Current Outpatient Medications:  atorvastatin (LIPITOR) 40 MG tablet, Take 1 tablet (40 mg total) by mouth daily., Disp: 100 tablet, Rfl: 0   buPROPion (WELLBUTRIN XL) 150 MG 24 hr tablet, Take 1 tablet (150 mg total) by mouth daily., Disp: 30 tablet, Rfl: 1   Calcium-Vitamin D 600-5 MG-MCG TABS, Take 1 each by mouth daily., Disp: 90 tablet, Rfl: 1   cyanocobalamin (VITAMIN B12) 1000 MCG tablet, Take 1 tablet (1,000 mcg total) by mouth daily., Disp: 90 tablet, Rfl: 0   levothyroxine (SYNTHROID) 50 MCG tablet, Take 1 tablet (50 mcg total) by mouth daily., Disp: 90 tablet, Rfl: 1   meloxicam (MOBIC) 15 MG tablet, Take 15 mg by mouth daily., Disp: , Rfl:    metFORMIN (GLUCOPHAGE) 500 MG tablet, Take 1  tablet (500 mg total) by mouth 2 (two) times daily with a meal., Disp: 180 tablet, Rfl: 3   Olmesartan-amLODIPine-HCTZ 40-10-25 MG TABS, TAKE ONE (1) TABLET BY MOUTH EVERY DAY, Disp: 90 tablet, Rfl: 1   venlafaxine XR (EFFEXOR-XR) 150 MG 24 hr capsule, Take 1 capsule (150 mg total) by mouth daily with breakfast., Disp: 90 capsule, Rfl: 1  Past Medical History:  Diagnosis Date   Anemia    "YOUNGER"   Anxiety    Arthritis    HANDS   CMC arthritis    left thumb   Depression    Gout    High cholesterol    Hypertension    Hypothyroidism    Secondary hypertension 02/05/2020   Sleep apnea    Objective:  PHYSICAL EXAM:   BP 120/68 (BP Location: Left Arm, Patient Position: Sitting)   Pulse 84   Temp 97.8 F (36.6 C) (Temporal)   Ht 5\' 3"  (1.6 m)   Wt 223 lb (101.2 kg)   SpO2 98%   BMI 39.50 kg/m    GEN: Well nourished, well developed, in no acute distress  Cardiac: RRR; no murmurs, rubs, or gallops,no edema -  Respiratory:  normal respiratory rate and pattern with no distress - normal breath sounds with no rales, rhonchi, wheezes or rubs MS: no deformity or atrophy  Skin: warm and dry, no rash  Neuro:  Alert and Oriented x 3, Strength and sensation are intact - CN II-Xii grossly intact Psych: euthymic mood, appropriate affect and demeanor  Assessment & Plan:    Prediabetes -     Hemoglobin A1c Continue meds  Low vitamin D level -     VITAMIN D 25 Hydroxy (Vit-D Deficiency, Fractures)  Acquired hypothyroidism -     TSH Continue levothyroxine  Primary hypertension -     CBC with Differential/Platelet -     Comprehensive metabolic panel Continue med Mixed hyperlipidemia -     Comprehensive metabolic panel -     Lipid panel Continue med Absence of both cervix and uterus, acquired     Follow-up: Return in about 6 months (around 01/05/2023) for chronic fasting follow-up - -schedule phone mcr well with Selena Batten.  An After Visit Summary was printed and given to the  patient.  Jettie Pagan [facility] [phone]

## 2022-07-07 ENCOUNTER — Other Ambulatory Visit: Payer: Self-pay | Admitting: Physician Assistant

## 2022-07-07 DIAGNOSIS — R899 Unspecified abnormal finding in specimens from other organs, systems and tissues: Secondary | ICD-10-CM

## 2022-07-07 LAB — LIPID PANEL
Chol/HDL Ratio: 3.2 ratio (ref 0.0–4.4)
Cholesterol, Total: 139 mg/dL (ref 100–199)
HDL: 43 mg/dL (ref >39)
LDL Chol Calc (NIH): 74 mg/dL (ref 0–99)
Triglycerides: 123 mg/dL (ref 0–149)
VLDL Cholesterol Cal: 22 mg/dL (ref 5–40)

## 2022-07-07 LAB — CBC WITH DIFFERENTIAL/PLATELET
Basophils Absolute: 0.1 10*3/uL (ref 0.0–0.2)
Basos: 1 %
EOS (ABSOLUTE): 0.2 10*3/uL (ref 0.0–0.4)
Eos: 3 %
Hematocrit: 38.4 % (ref 34.0–46.6)
Hemoglobin: 12.4 g/dL (ref 11.1–15.9)
Immature Grans (Abs): 0 10*3/uL (ref 0.0–0.1)
Immature Granulocytes: 0 %
Lymphocytes Absolute: 1.8 10*3/uL (ref 0.7–3.1)
Lymphs: 28 %
MCH: 29.3 pg (ref 26.6–33.0)
MCHC: 32.3 g/dL (ref 31.5–35.7)
MCV: 91 fL (ref 79–97)
Monocytes Absolute: 0.6 10*3/uL (ref 0.1–0.9)
Monocytes: 9 %
Neutrophils Absolute: 3.9 10*3/uL (ref 1.4–7.0)
Neutrophils: 59 %
Platelets: 303 10*3/uL (ref 150–450)
RBC: 4.23 x10E6/uL (ref 3.77–5.28)
RDW: 14.2 % (ref 11.7–15.4)
WBC: 6.6 10*3/uL (ref 3.4–10.8)

## 2022-07-07 LAB — LITHOLINK CKD PROGRAM

## 2022-07-07 LAB — COMPREHENSIVE METABOLIC PANEL
ALT: 16 IU/L (ref 0–32)
AST: 14 IU/L (ref 0–40)
Albumin/Globulin Ratio: 2.4 — ABNORMAL HIGH (ref 1.2–2.2)
Albumin: 4.5 g/dL (ref 3.9–4.9)
Alkaline Phosphatase: 88 IU/L (ref 44–121)
BUN/Creatinine Ratio: 15 (ref 12–28)
BUN: 21 mg/dL (ref 8–27)
Bilirubin Total: 0.5 mg/dL (ref 0.0–1.2)
CO2: 25 mmol/L (ref 20–29)
Calcium: 10 mg/dL (ref 8.7–10.3)
Chloride: 98 mmol/L (ref 96–106)
Creatinine, Ser: 1.43 mg/dL — ABNORMAL HIGH (ref 0.57–1.00)
Globulin, Total: 1.9 g/dL (ref 1.5–4.5)
Glucose: 99 mg/dL (ref 70–99)
Potassium: 4.2 mmol/L (ref 3.5–5.2)
Sodium: 140 mmol/L (ref 134–144)
Total Protein: 6.4 g/dL (ref 6.0–8.5)
eGFR: 41 mL/min/{1.73_m2} — ABNORMAL LOW (ref >59)

## 2022-07-07 LAB — HEMOGLOBIN A1C
Est. average glucose Bld gHb Est-mCnc: 120 mg/dL
Hgb A1c MFr Bld: 5.8 % — ABNORMAL HIGH (ref 4.8–5.6)

## 2022-07-07 LAB — VITAMIN D 25 HYDROXY (VIT D DEFICIENCY, FRACTURES): Vit D, 25-Hydroxy: 38 ng/mL (ref 30.0–100.0)

## 2022-07-07 LAB — TSH: TSH: 1.22 u[IU]/mL (ref 0.450–4.500)

## 2022-07-10 ENCOUNTER — Other Ambulatory Visit: Payer: Self-pay

## 2022-07-10 DIAGNOSIS — E039 Hypothyroidism, unspecified: Secondary | ICD-10-CM

## 2022-07-10 DIAGNOSIS — I10 Essential (primary) hypertension: Secondary | ICD-10-CM

## 2022-07-10 DIAGNOSIS — R7303 Prediabetes: Secondary | ICD-10-CM

## 2022-07-10 DIAGNOSIS — F325 Major depressive disorder, single episode, in full remission: Secondary | ICD-10-CM

## 2022-07-10 DIAGNOSIS — R7301 Impaired fasting glucose: Secondary | ICD-10-CM

## 2022-07-11 MED ORDER — VENLAFAXINE HCL ER 150 MG PO CP24
150.0000 mg | ORAL_CAPSULE | Freq: Every day | ORAL | 1 refills | Status: DC
Start: 2022-07-11 — End: 2023-01-09

## 2022-07-11 MED ORDER — METFORMIN HCL 500 MG PO TABS
500.0000 mg | ORAL_TABLET | Freq: Two times a day (BID) | ORAL | 1 refills | Status: DC
Start: 2022-07-11 — End: 2023-04-24

## 2022-07-11 MED ORDER — OLMESARTAN-AMLODIPINE-HCTZ 40-10-25 MG PO TABS
ORAL_TABLET | ORAL | 1 refills | Status: DC
Start: 2022-07-11 — End: 2023-02-12

## 2022-07-11 MED ORDER — LEVOTHYROXINE SODIUM 50 MCG PO TABS: 50.0000 ug | ORAL_TABLET | Freq: Every day | ORAL | 1 refills | Status: DC

## 2022-07-11 MED ORDER — ATORVASTATIN CALCIUM 40 MG PO TABS: 40.0000 mg | ORAL_TABLET | Freq: Every day | ORAL | 1 refills | Status: DC

## 2022-07-11 MED ORDER — MELOXICAM 15 MG PO TABS: 15.0000 mg | ORAL_TABLET | Freq: Every day | ORAL | 1 refills | Status: DC

## 2022-07-20 ENCOUNTER — Ambulatory Visit: Payer: Medicare HMO

## 2022-07-20 DIAGNOSIS — R899 Unspecified abnormal finding in specimens from other organs, systems and tissues: Secondary | ICD-10-CM

## 2022-07-21 LAB — COMPREHENSIVE METABOLIC PANEL
ALT: 20 IU/L (ref 0–32)
AST: 19 IU/L (ref 0–40)
Albumin: 4.6 g/dL (ref 3.9–4.9)
Alkaline Phosphatase: 85 IU/L (ref 44–121)
BUN/Creatinine Ratio: 15 (ref 12–28)
BUN: 20 mg/dL (ref 8–27)
Bilirubin Total: 0.5 mg/dL (ref 0.0–1.2)
CO2: 25 mmol/L (ref 20–29)
Calcium: 10.1 mg/dL (ref 8.7–10.3)
Chloride: 102 mmol/L (ref 96–106)
Creatinine, Ser: 1.36 mg/dL — ABNORMAL HIGH (ref 0.57–1.00)
Globulin, Total: 2 g/dL (ref 1.5–4.5)
Glucose: 111 mg/dL — ABNORMAL HIGH (ref 70–99)
Potassium: 4.2 mmol/L (ref 3.5–5.2)
Sodium: 143 mmol/L (ref 134–144)
Total Protein: 6.6 g/dL (ref 6.0–8.5)
eGFR: 44 mL/min/{1.73_m2} — ABNORMAL LOW (ref >59)

## 2022-07-21 LAB — LITHOLINK CKD PROGRAM

## 2022-09-14 ENCOUNTER — Ambulatory Visit (INDEPENDENT_AMBULATORY_CARE_PROVIDER_SITE_OTHER): Payer: Medicare HMO | Admitting: Physician Assistant

## 2022-09-14 ENCOUNTER — Encounter: Payer: Self-pay | Admitting: Physician Assistant

## 2022-09-14 VITALS — BP 110/70 | HR 78 | Temp 97.5°F | Resp 12 | Ht 63.0 in | Wt 205.0 lb

## 2022-09-14 DIAGNOSIS — L239 Allergic contact dermatitis, unspecified cause: Secondary | ICD-10-CM | POA: Diagnosis not present

## 2022-09-14 DIAGNOSIS — L209 Atopic dermatitis, unspecified: Secondary | ICD-10-CM | POA: Diagnosis not present

## 2022-09-14 MED ORDER — TRIAMCINOLONE ACETONIDE 40 MG/ML IJ SUSP
80.0000 mg | Freq: Once | INTRAMUSCULAR | Status: AC
Start: 2022-09-14 — End: 2022-09-14
  Administered 2022-09-14: 80 mg via INTRAMUSCULAR

## 2022-09-14 MED ORDER — FLUOCINONIDE EMULSIFIED BASE 0.05 % EX CREA
1.0000 | TOPICAL_CREAM | Freq: Two times a day (BID) | CUTANEOUS | 2 refills | Status: DC
Start: 2022-09-14 — End: 2022-09-20

## 2022-09-14 NOTE — Assessment & Plan Note (Signed)
Prescribed Lidex creme to help with current lesions Given Kenalog shot in office Patient will follow up if symptoms continue or get worse Educated on taking cool showers and drying skin thoroughly.

## 2022-09-14 NOTE — Progress Notes (Signed)
Acute Office Visit  Subjective:    Patient ID: Danielle Jones, female    DOB: 05-06-1960, 62 y.o.   MRN: [id number]  Chief Complaint  Patient presents with   Rash    HPI: Patient is in today for rash on scalp, buttocks, and both legs. She mentioned the rash started couples months ago on the scalp and then get down to the shoulders to the buttocks. It is very itchy, warm, tender to the touch. She tried alcohol, peroxide, neosporin, cover with band aid, but nothing helps, She is the only one at home. Denies any history of bed bugs or scabies. Denies being around anyone who has either of those. Denies fever, chills, or sever erythema with the rash. Admits to it being pruritic to the point she wakes up at night scratching.   Past Medical History:  Diagnosis Date   Anemia    "YOUNGER"   Anxiety    Arthritis    HANDS   CMC arthritis    left thumb   Depression    Gout    High cholesterol    Hypertension    Hypothyroidism    Secondary hypertension 02/05/2020   Sleep apnea     Past Surgical History:  Procedure Laterality Date   ABDOMINAL HYSTERECTOMY     2007   Carpal tunnel  2019   CARPOMETACARPEL SUSPENSION PLASTY Left 05/12/2020   Procedure: LEFT THUMB CARPOMETACARPAL ARTHROPLASTY;  Surgeon: Tarry Kos, MD;  Location: [facility];  Service: Orthopedics;  Laterality: Left;   thumb release  2019    Family History  Problem Relation Age of Onset   Arthritis Mother    Alzheimer's disease Mother    Kidney failure Mother    Congenital heart disease Father    Melanoma Father    Healthy Son    Healthy Son    Blindness Son    Mental illness Son    Colon cancer Neg Hx    Colon polyps Neg Hx    Crohn's disease Neg Hx    Esophageal cancer Neg Hx    Stomach cancer Neg Hx    Rectal cancer Neg Hx    Ulcerative colitis Neg Hx    Breast cancer Neg Hx     Social History   Socioeconomic History   Marital status: Single    Spouse name: Not on file   Number  of children: Not on file   Years of education: Not on file   Highest education level: GED or equivalent  Occupational History   Not on file  Tobacco Use   Smoking status: Never    Passive exposure: Past   Smokeless tobacco: Never  Vaping Use   Vaping status: Never Used  Substance and Sexual Activity   Alcohol use: Yes    Comment: social   Drug use: No   Sexual activity: Not on file  Other Topics Concern   Not on file  Social History Narrative   Not on file   Social Determinants of Health   Financial Resource Strain: Low Risk  (07/02/2022)   Overall Financial Resource Strain (CARDIA)    Difficulty of Paying Living Expenses: Not very hard  Food Insecurity: No Food Insecurity (07/02/2022)   Hunger Vital Sign    Worried About Running Out of Food in the Last Year: Never true    Ran Out of Food in the Last Year: Never true  Transportation Needs: No Transportation Needs (07/02/2022)   PRAPARE - Transportation  Lack of Transportation (Medical): No    Lack of Transportation (Non-Medical): No  Physical Activity: Unknown (07/02/2022)   Exercise Vital Sign    Days of Exercise per Week: 0 days    Minutes of Exercise per Session: Not on file  Stress: Stress Concern Present (07/02/2022)   [employer] of Occupational Health - Occupational Stress Questionnaire    Feeling of Stress : To some extent  Social Connections: Socially Isolated (07/02/2022)   Social Connection and Isolation Panel [NHANES]    Frequency of Communication with Friends and Family: Never    Frequency of Social Gatherings with Friends and Family: Never    Attends Religious Services: Never    Database administrator or Organizations: No    Attends Engineer, structural: Not on file    Marital Status: Married  Catering manager Violence: Not on file    Outpatient Medications Prior to Visit  Medication Sig Dispense Refill   atorvastatin (LIPITOR) 40 MG tablet Take 1 tablet (40 mg total) by mouth daily. 90 tablet  1   buPROPion (WELLBUTRIN XL) 150 MG 24 hr tablet Take 1 tablet (150 mg total) by mouth daily. 30 tablet 1   Calcium-Vitamin D 600-5 MG-MCG TABS Take 1 each by mouth daily. 90 tablet 1   cyanocobalamin (VITAMIN B12) 1000 MCG tablet Take 1 tablet (1,000 mcg total) by mouth daily. 90 tablet 0   levothyroxine (SYNTHROID) 50 MCG tablet Take 1 tablet (50 mcg total) by mouth daily. 90 tablet 1   meloxicam (MOBIC) 15 MG tablet Take 1 tablet (15 mg total) by mouth daily. 90 tablet 1   metFORMIN (GLUCOPHAGE) 500 MG tablet Take 1 tablet (500 mg total) by mouth 2 (two) times daily with a meal. 180 tablet 1   Olmesartan-amLODIPine-HCTZ 40-10-25 MG TABS TAKE ONE (1) TABLET BY MOUTH EVERY DAY 90 tablet 1   venlafaxine XR (EFFEXOR-XR) 150 MG 24 hr capsule Take 1 capsule (150 mg total) by mouth daily with breakfast. 90 capsule 1   No facility-administered medications prior to visit.    No Known Allergies  Review of Systems  Constitutional:  Negative for chills, fatigue and fever.  HENT:  Negative for congestion, ear pain and sore throat.   Respiratory:  Negative for cough and shortness of breath.   Cardiovascular:  Negative for chest pain and palpitations.  Gastrointestinal:  Negative for abdominal pain, constipation, diarrhea, nausea and vomiting.  Endocrine: Negative for polydipsia, polyphagia and polyuria.  Genitourinary:  Negative for difficulty urinating and dysuria.  Musculoskeletal:  Negative for arthralgias, back pain and myalgias.  Skin:  Positive for rash.  Neurological:  Positive for headaches.  Psychiatric/Behavioral:  Negative for dysphoric mood. The patient is not nervous/anxious.        Objective:        09/14/2022   10:32 AM 07/06/2022   10:08 AM 04/24/2022   10:28 AM  Vitals with BMI  Height 5\' 3"  5\' 3"  5\' 3"   Weight 205 lbs 223 lbs 228 lbs  BMI 36.32 39.51 40.4  Systolic 110 120 161  Diastolic 70 68 66  Pulse 78 84 76    No data found.   Physical Exam Vitals reviewed.   Constitutional:      Appearance: Normal appearance.  Neck:     Vascular: No carotid bruit.  Cardiovascular:     Rate and Rhythm: Normal rate and regular rhythm.     Heart sounds: Normal heart sounds.  Pulmonary:     Effort: Pulmonary  effort is normal.     Breath sounds: Normal breath sounds.  Abdominal:     General: Bowel sounds are normal.     Palpations: Abdomen is soft.     Tenderness: There is no abdominal tenderness.  Skin:    General: Skin is warm and dry.     Findings: Erythema, lesion and rash present. Rash is purpuric.          Comments: Excoriations, with multiple 2X2 mm lesions  Neurological:     Mental Status: She is alert and oriented to person, place, and time.  Psychiatric:        Mood and Affect: Mood normal.        Behavior: Behavior normal.    Total time spent on today's visit was greater than 30 minutes, including both face-to-face time and nonface-to-face time personally spent on review of chart (labs and imaging), discussing labs and goals, discussing further work-up, treatment options, referrals to specialist if needed, reviewing outside records of pertinent, answering patient's questions, and coordinating care.  Health Maintenance Due  Topic Date Due   Medicare Annual Wellness (AWV)  Never done   DTaP/Tdap/Td (1 - Tdap) Never done   INFLUENZA VACCINE  08/31/2022    There are no preventive care reminders to display for this patient.   Lab Results  Component Value Date   TSH 1.220 07/06/2022   Lab Results  Component Value Date   WBC 6.6 07/06/2022   HGB 12.4 07/06/2022   HCT 38.4 07/06/2022   MCV 91 07/06/2022   PLT 303 07/06/2022   Lab Results  Component Value Date   NA 143 07/20/2022   K 4.2 07/20/2022   CO2 25 07/20/2022   GLUCOSE 111 (H) 07/20/2022   BUN 20 07/20/2022   CREATININE 1.36 (H) 07/20/2022   BILITOT 0.5 07/20/2022   ALKPHOS 85 07/20/2022   AST 19 07/20/2022   ALT 20 07/20/2022   PROT 6.6 07/20/2022   ALBUMIN 4.6  07/20/2022   CALCIUM 10.1 07/20/2022   ANIONGAP 8 05/10/2020   EGFR 44 (L) 07/20/2022   Lab Results  Component Value Date   CHOL 139 07/06/2022   Lab Results  Component Value Date   HDL 43 07/06/2022   Lab Results  Component Value Date   LDLCALC 74 07/06/2022   Lab Results  Component Value Date   TRIG 123 07/06/2022   Lab Results  Component Value Date   CHOLHDL 3.2 07/06/2022   Lab Results  Component Value Date   HGBA1C 5.8 (H) 07/06/2022       Assessment & Plan:  Atopic dermatitis in adult Assessment & Plan: Prescribed Lidex creme to help with current lesions Given Kenalog shot in office Patient will follow up if symptoms continue or get worse Educated on taking cool showers and drying skin thoroughly.    Allergic dermatitis -     Fluocinonide Emulsified Base; Apply 1 Application topically 2 (two) times daily.  Dispense: 30 g; Refill: 2 -     Triamcinolone Acetonide     Meds ordered this encounter  Medications   fluocinonide-emollient (LIDEX-E) 0.05 % cream    Sig: Apply 1 Application topically 2 (two) times daily.    Dispense:  30 g    Refill:  2   triamcinolone acetonide (KENALOG-40) injection 80 mg    No orders of the defined types were placed in this encounter.    Follow-up: Return if symptoms worsen or fail to improve, for Julio Sicks.  An After  Visit Summary was printed and given to the patient.  Langley Gauss, [place] [facility] [phone]

## 2022-09-20 ENCOUNTER — Ambulatory Visit (INDEPENDENT_AMBULATORY_CARE_PROVIDER_SITE_OTHER): Payer: Medicare HMO | Admitting: Physician Assistant

## 2022-09-20 ENCOUNTER — Encounter: Payer: Self-pay | Admitting: Physician Assistant

## 2022-09-20 VITALS — BP 118/72 | HR 72 | Temp 97.3°F | Ht 63.0 in | Wt 201.0 lb

## 2022-09-20 DIAGNOSIS — L309 Dermatitis, unspecified: Secondary | ICD-10-CM | POA: Diagnosis not present

## 2022-09-20 MED ORDER — CLOBETASOL PROPIONATE 0.05 % EX SOLN
1.0000 | Freq: Two times a day (BID) | CUTANEOUS | 0 refills | Status: DC
Start: 2022-09-20 — End: 2022-10-23

## 2022-09-20 MED ORDER — PREDNISONE 20 MG PO TABS: ORAL_TABLET | ORAL | 0 refills | Status: DC

## 2022-09-20 MED ORDER — DOXYCYCLINE HYCLATE 100 MG PO TABS: 100.0000 mg | ORAL_TABLET | Freq: Two times a day (BID) | ORAL | 0 refills | Status: DC

## 2022-09-20 NOTE — Progress Notes (Signed)
Acute Office Visit  Subjective:    Patient ID: Danielle Jones, female    DOB: 06-29-1960, 62 y.o.   MRN: [id number]  Chief Complaint  Patient presents with   Rash    HPI: Patient is in today for rash across buttocks , back of upper legs and neck.  She has had trouble for several months.  Complains of itching - cannot recall exposure to any allergens Did receive a kenalog shot last week which slightly helped Says lesions start as small pustule or blister   Current Outpatient Medications:    atorvastatin (LIPITOR) 40 MG tablet, Take 1 tablet (40 mg total) by mouth daily., Disp: 90 tablet, Rfl: 1   buPROPion (WELLBUTRIN XL) 150 MG 24 hr tablet, Take 1 tablet (150 mg total) by mouth daily., Disp: 30 tablet, Rfl: 1   Calcium-Vitamin D 600-5 MG-MCG TABS, Take 1 each by mouth daily., Disp: 90 tablet, Rfl: 1   clobetasol (TEMOVATE) 0.05 % external solution, Apply 1 Application topically 2 (two) times daily., Disp: 50 mL, Rfl: 0   cyanocobalamin (VITAMIN B12) 1000 MCG tablet, Take 1 tablet (1,000 mcg total) by mouth daily., Disp: 90 tablet, Rfl: 0   doxycycline (VIBRA-TABS) 100 MG tablet, Take 1 tablet (100 mg total) by mouth 2 (two) times daily., Disp: 20 tablet, Rfl: 0   levothyroxine (SYNTHROID) 50 MCG tablet, Take 1 tablet (50 mcg total) by mouth daily., Disp: 90 tablet, Rfl: 1   meloxicam (MOBIC) 15 MG tablet, Take 1 tablet (15 mg total) by mouth daily., Disp: 90 tablet, Rfl: 1   metFORMIN (GLUCOPHAGE) 500 MG tablet, Take 1 tablet (500 mg total) by mouth 2 (two) times daily with a meal., Disp: 180 tablet, Rfl: 1   Olmesartan-amLODIPine-HCTZ 40-10-25 MG TABS, TAKE ONE (1) TABLET BY MOUTH EVERY DAY, Disp: 90 tablet, Rfl: 1   predniSONE (DELTASONE) 20 MG tablet, 1 po tid for 3 days then 1 po bid for 3 days then 1 po qd for 3 days, Disp: 18 tablet, Rfl: 0   venlafaxine XR (EFFEXOR-XR) 150 MG 24 hr capsule, Take 1 capsule (150 mg total) by mouth daily with breakfast., Disp: 90 capsule, Rfl:  1  No Known Allergies  ROS CONSTITUTIONAL: Negative for chills, fatigue, fever,   CARDIOVASCULAR: Negative for chest pain,  RESPIRATORY: Negative for recent cough and dyspnea.   INTEGUMENTARY: see HPI      Objective:    PHYSICAL EXAM:   BP 118/72   Pulse 72   Temp (!) 97.3 F (36.3 C)   Ht 5\' 3"  (1.6 m)   Wt 201 lb (91.2 kg)   SpO2 98%   BMI 35.61 kg/m    GEN: Well nourished, well developed, in no acute distress   Cardiac: RRR; no murmurs,  Respiratory:  normal respiratory rate and pattern with no distress - normal breath sounds with no rales, rhonchi, wheezes or rubs  Skin: pustules and excoriated lesions noted across entire aspect of both buttocks and upper legs - same type lesions noted in scalp      Assessment & Plan:    Dermatitis -     Doxycycline Hyclate; Take 1 tablet (100 mg total) by mouth 2 (two) times daily.  Dispense: 20 tablet; Refill: 0 -     predniSONE; 1 po tid for 3 days then 1 po bid for 3 days then 1 po qd for 3 days  Dispense: 18 tablet; Refill: 0 -     Clobetasol Propionate; Apply 1 Application topically  2 (two) times daily.  Dispense: 50 mL; Refill: 0   Recommend antibacterial soap Refer to dermatology if no improvement  Follow-up: Return if symptoms worsen or fail to improve.  An After Visit Summary was printed and given to the patient.  Jettie Pagan [facility] [phone]

## 2022-09-21 ENCOUNTER — Other Ambulatory Visit: Payer: Self-pay

## 2022-09-21 DIAGNOSIS — F325 Major depressive disorder, single episode, in full remission: Secondary | ICD-10-CM

## 2022-09-21 MED ORDER — BUPROPION HCL ER (XL) 150 MG PO TB24
150.0000 mg | ORAL_TABLET | Freq: Every day | ORAL | 1 refills | Status: DC
Start: 2022-09-21 — End: 2022-10-23

## 2022-10-23 ENCOUNTER — Encounter: Payer: Self-pay | Admitting: Physician Assistant

## 2022-10-23 ENCOUNTER — Ambulatory Visit (INDEPENDENT_AMBULATORY_CARE_PROVIDER_SITE_OTHER): Payer: Medicare HMO | Admitting: Physician Assistant

## 2022-10-23 VITALS — BP 124/80 | HR 75 | Temp 97.9°F | Ht 63.0 in | Wt 199.8 lb

## 2022-10-23 DIAGNOSIS — E039 Hypothyroidism, unspecified: Secondary | ICD-10-CM | POA: Diagnosis not present

## 2022-10-23 DIAGNOSIS — R42 Dizziness and giddiness: Secondary | ICD-10-CM

## 2022-10-23 DIAGNOSIS — Z23 Encounter for immunization: Secondary | ICD-10-CM

## 2022-10-23 DIAGNOSIS — R7989 Other specified abnormal findings of blood chemistry: Secondary | ICD-10-CM | POA: Diagnosis not present

## 2022-10-23 DIAGNOSIS — E782 Mixed hyperlipidemia: Secondary | ICD-10-CM | POA: Diagnosis not present

## 2022-10-23 DIAGNOSIS — L309 Dermatitis, unspecified: Secondary | ICD-10-CM

## 2022-10-23 DIAGNOSIS — I10 Essential (primary) hypertension: Secondary | ICD-10-CM

## 2022-10-23 DIAGNOSIS — F325 Major depressive disorder, single episode, in full remission: Secondary | ICD-10-CM | POA: Diagnosis not present

## 2022-10-23 DIAGNOSIS — R7303 Prediabetes: Secondary | ICD-10-CM

## 2022-10-23 MED ORDER — BUPROPION HCL ER (XL) 150 MG PO TB24
150.0000 mg | ORAL_TABLET | Freq: Every day | ORAL | 5 refills | Status: DC
Start: 2022-10-23 — End: 2023-04-24

## 2022-10-23 NOTE — Progress Notes (Signed)
Subjective:  Patient ID: Danielle Jones, female    DOB: September 08, 1960  Age: 62 y.o. MRN: [id number]  Chief Complaint  Patient presents with   Medical Management of Chronic Issues    HPI Mixed hyperlipidemia  Pt presents with hyperlipidemia. Compliance with treatment has been good The patient is compliant with medications, maintains a low cholesterol diet , follows up as directed , and maintains an exercise regimen . The patient denies experiencing any hypercholesterolemia related symptoms. Taking lipitor 40mg  qd  Pt with hypothyroidism - taking synthroid qd - due for labwork  Pt with NIDDM - taking glucophage 500mg  and voices no concerns or problems - not having any significantly elevated glucose readings  Pt with anxiety - stable on effexor and wellbutrin  Pt presents for follow up of hypertension. The patient is tolerating the medication well without side effects. Compliance with treatment has been good; including taking medication as directed , maintains a healthy diet and regular exercise regimen , and following up as directed. Currently taking olmesartan/amlodipine/hctz Denies chest pain or shortness of breath but last week had dizziness and fatigue for 3 days solid - said overall did not feel well - did have positive exposure to COVID (presumably this)  Pt still dealing with rash on backs of legs and buttocks over the past several months - she would like referral to dermatology     10/23/2022    1:18 PM 07/06/2022   10:07 AM 03/27/2022   10:14 AM 03/27/2022    9:21 AM 12/20/2021   12:01 PM  Depression screen PHQ 2/9  Decreased Interest 1 2 3 3 2   Down, Depressed, Hopeless 1 1 3 3 2   PHQ - 2 Score 2 3 6 6 4   Altered sleeping 1 1 2 1 3   Tired, decreased energy 1 2 3 3 3   Change in appetite 0 1 3 3 2   Feeling bad or failure about yourself  0 1 3 3 1   Trouble concentrating 0 1 2 1 1   Moving slowly or fidgety/restless 0 0 1 0 0  Suicidal thoughts 0 0 0 0 0  PHQ-9 Score 4 9  20 17 14   Difficult doing work/chores Not difficult at all Somewhat difficult Very difficult Very difficult Not difficult at all        03/18/2020    8:42 AM 05/12/2020   10:17 AM 04/01/2021    9:03 AM 07/06/2022   10:07 AM 10/23/2022    1:18 PM  Fall Risk  Falls in the past year? 0  0 0 0  Was there an injury with Fall? 0  0 0 0  Fall Risk Category Calculator 0  0 0 0  Fall Risk Category (Retired) Low  Low    (RETIRED) Patient Fall Risk Level Low fall risk Low fall risk Low fall risk    Patient at Risk for Falls Due to    No Fall Risks No Fall Risks  Fall risk Follow up    Falls evaluation completed Falls evaluation completed     ROS CONSTITUTIONAL: Negative for chills, fatigue, fever, unintentional weight gain and unintentional weight loss.  E/N/T: Negative for ear pain, nasal congestion and sore throat.  CARDIOVASCULAR: see HPI RESPIRATORY: Negative for recent cough and dyspnea.  GASTROINTESTINAL: Negative for abdominal pain, acid reflux symptoms, constipation, diarrhea, nausea and vomiting.  MSK: Negative for arthralgias and myalgias.  INTEGUMENTARY: see HPI NEUROLOGICAL: see HPI PSYCHIATRIC: Negative for sleep disturbance and to question depression screen.  Negative  for depression, negative for anhedonia.    Current Outpatient Medications:    atorvastatin (LIPITOR) 40 MG tablet, Take 1 tablet (40 mg total) by mouth daily., Disp: 90 tablet, Rfl: 1   Calcium-Vitamin D 600-5 MG-MCG TABS, Take 1 each by mouth daily., Disp: 90 tablet, Rfl: 1   cyanocobalamin (VITAMIN B12) 1000 MCG tablet, Take 1 tablet (1,000 mcg total) by mouth daily., Disp: 90 tablet, Rfl: 0   levothyroxine (SYNTHROID) 50 MCG tablet, Take 1 tablet (50 mcg total) by mouth daily., Disp: 90 tablet, Rfl: 1   meloxicam (MOBIC) 15 MG tablet, Take 1 tablet (15 mg total) by mouth daily., Disp: 90 tablet, Rfl: 1   metFORMIN (GLUCOPHAGE) 500 MG tablet, Take 1 tablet (500 mg total) by mouth 2 (two) times daily with a meal.,  Disp: 180 tablet, Rfl: 1   Olmesartan-amLODIPine-HCTZ 40-10-25 MG TABS, TAKE ONE (1) TABLET BY MOUTH EVERY DAY, Disp: 90 tablet, Rfl: 1   venlafaxine XR (EFFEXOR-XR) 150 MG 24 hr capsule, Take 1 capsule (150 mg total) by mouth daily with breakfast., Disp: 90 capsule, Rfl: 1   buPROPion (WELLBUTRIN XL) 150 MG 24 hr tablet, Take 1 tablet (150 mg total) by mouth daily., Disp: 30 tablet, Rfl: 5  Past Medical History:  Diagnosis Date   Anemia    "YOUNGER"   Anxiety    Arthritis    HANDS   CMC arthritis    left thumb   Depression    Gout    High cholesterol    Hypertension    Hypothyroidism    Secondary hypertension 02/05/2020   Sleep apnea    Objective:  PHYSICAL EXAM:   BP 124/80 (BP Location: Left Arm, Patient Position: Sitting, Cuff Size: Large)   Pulse 75   Temp 97.9 F (36.6 C) (Temporal)   Ht 5\' 3"  (1.6 m)   Wt 199 lb 12.8 oz (90.6 kg)   SpO2 98%   BMI 35.39 kg/m    GEN: Well nourished, well developed, in no acute distress  Cardiac: RRR; no murmurs, rubs, or gallops,no edema - no significant varicosities Respiratory:  normal respiratory rate and pattern with no distress - normal breath sounds with no rales, rhonchi, wheezes or rubs GI: normal bowel sounds, no masses or tenderness MS: no deformity or atrophy  Skin: scabbed lesions on backs of legs Neuro:  Alert and Oriented x 3, - CN II-Xii grossly intact Psych: euthymic mood, appropriate affect and demeanor  Assessment & Plan:    Prediabetes -     Hemoglobin A1c Watch diet Low vitamin D level -     VITAMIN D 25 Hydroxy (Vit-D Deficiency, Fractures)  Acquired hypothyroidism Continue current med Primary hypertension -     CBC with Differential/Platelet -     Comprehensive metabolic panel -     TSH Continue med Mixed hyperlipidemia -     Lipid panel Continue med and watch diet Needs flu shot -     Influenza, MDCK, trivalent, PF(Flucelvax egg-free)  Dizziness -     EKG 12-Lead Follow up if symptoms  recur Dermatitis -     Ambulatory referral to Dermatology  Depression, major, single episode, complete remission (HCC) -     buPROPion HCl ER (XL); Take 1 tablet (150 mg total) by mouth daily.  Dispense: 30 tablet; Refill: 5     Follow-up: Return in about 6 months (around 04/22/2023) for chronic fasting follow-up.  An After Visit Summary was printed and given to the patient.  SARA R  Dimas [facility] [phone]

## 2022-10-24 LAB — CBC WITH DIFFERENTIAL/PLATELET
Basophils Absolute: 0.1 10*3/uL (ref 0.0–0.2)
Basos: 1 %
EOS (ABSOLUTE): 0.1 10*3/uL (ref 0.0–0.4)
Eos: 2 %
Hematocrit: 37.4 % (ref 34.0–46.6)
Hemoglobin: 11.8 g/dL (ref 11.1–15.9)
Immature Grans (Abs): 0.1 10*3/uL (ref 0.0–0.1)
Immature Granulocytes: 1 %
Lymphocytes Absolute: 2 10*3/uL (ref 0.7–3.1)
Lymphs: 28 %
MCH: 29.6 pg (ref 26.6–33.0)
MCHC: 31.6 g/dL (ref 31.5–35.7)
MCV: 94 fL (ref 79–97)
Monocytes Absolute: 0.7 10*3/uL (ref 0.1–0.9)
Monocytes: 9 %
Neutrophils Absolute: 4.1 10*3/uL (ref 1.4–7.0)
Neutrophils: 59 %
Platelets: 377 10*3/uL (ref 150–450)
RBC: 3.98 x10E6/uL (ref 3.77–5.28)
RDW: 14 % (ref 11.7–15.4)
WBC: 7 10*3/uL (ref 3.4–10.8)

## 2022-10-24 LAB — COMPREHENSIVE METABOLIC PANEL
ALT: 21 IU/L (ref 0–32)
AST: 17 IU/L (ref 0–40)
Albumin: 4.6 g/dL (ref 3.9–4.9)
Alkaline Phosphatase: 105 IU/L (ref 44–121)
BUN/Creatinine Ratio: 20 (ref 12–28)
BUN: 20 mg/dL (ref 8–27)
Bilirubin Total: 0.6 mg/dL (ref 0.0–1.2)
CO2: 24 mmol/L (ref 20–29)
Calcium: 9.8 mg/dL (ref 8.7–10.3)
Chloride: 99 mmol/L (ref 96–106)
Creatinine, Ser: 1.02 mg/dL — ABNORMAL HIGH (ref 0.57–1.00)
Globulin, Total: 2 g/dL (ref 1.5–4.5)
Glucose: 86 mg/dL (ref 70–99)
Potassium: 4.6 mmol/L (ref 3.5–5.2)
Sodium: 142 mmol/L (ref 134–144)
Total Protein: 6.6 g/dL (ref 6.0–8.5)
eGFR: 62 mL/min/{1.73_m2} (ref >59)

## 2022-10-24 LAB — LIPID PANEL
Chol/HDL Ratio: 3.3 ratio (ref 0.0–4.4)
Cholesterol, Total: 194 mg/dL (ref 100–199)
HDL: 58 mg/dL (ref >39)
LDL Chol Calc (NIH): 114 mg/dL — ABNORMAL HIGH (ref 0–99)
Triglycerides: 124 mg/dL (ref 0–149)
VLDL Cholesterol Cal: 22 mg/dL (ref 5–40)

## 2022-10-24 LAB — TSH: TSH: 2.7 u[IU]/mL (ref 0.450–4.500)

## 2022-10-24 LAB — HEMOGLOBIN A1C
Est. average glucose Bld gHb Est-mCnc: 126 mg/dL
Hgb A1c MFr Bld: 6 % — ABNORMAL HIGH (ref 4.8–5.6)

## 2022-10-24 LAB — VITAMIN D 25 HYDROXY (VIT D DEFICIENCY, FRACTURES): Vit D, 25-Hydroxy: 52.1 ng/mL (ref 30.0–100.0)

## 2022-11-01 DIAGNOSIS — L209 Atopic dermatitis, unspecified: Secondary | ICD-10-CM | POA: Diagnosis not present

## 2022-11-01 DIAGNOSIS — L281 Prurigo nodularis: Secondary | ICD-10-CM | POA: Diagnosis not present

## 2022-12-11 ENCOUNTER — Ambulatory Visit (INDEPENDENT_AMBULATORY_CARE_PROVIDER_SITE_OTHER): Payer: Medicare HMO

## 2022-12-11 VITALS — Ht 63.0 in | Wt 199.0 lb

## 2022-12-11 DIAGNOSIS — Z0000 Encounter for general adult medical examination without abnormal findings: Secondary | ICD-10-CM

## 2022-12-11 NOTE — Patient Instructions (Signed)
Danielle Jones , Thank you for taking time to come for your Medicare Wellness Visit. I appreciate your ongoing commitment to your health goals. Please review the following plan we discussed and let me know if I can assist you in the future.   Referrals/Orders/Follow-Ups/Clinician Recommendations: Aim for 30 minutes of exercise or brisk walking, 6-8 glasses of water, and 5 servings of fruits and vegetables each day.  This is a list of the screening recommended for you and due dates:  Health Maintenance  Topic Date Due   DTaP/Tdap/Td vaccine (1 - Tdap) Never done   Mammogram  03/09/2023   Medicare Annual Wellness Visit  12/11/2023   Colon Cancer Screening  01/20/2032   Flu Shot  Completed   Zoster (Shingles) Vaccine  Completed   HPV Vaccine  Aged Out   COVID-19 Vaccine  Discontinued   Hepatitis C Screening  Discontinued   HIV Screening  Discontinued    Advanced directives: (ACP Link)Information on Advanced Care Planning can be found at [facility] Advance Health Care Directives Advance Health Care Directives ([email or web address]   Next Medicare Annual Wellness Visit scheduled for next year: Yes

## 2022-12-11 NOTE — Progress Notes (Signed)
Subjective:   Danielle Jones is a 62 y.o. female who presents for an Initial Medicare Annual Wellness Visit.  Visit Complete: Virtual I connected with  Laurin Coder on 12/11/22 by a audio enabled telemedicine application and verified that I am speaking with the correct person using two identifiers.  Patient Location: Home  Provider Location: Home Office  I discussed the limitations of evaluation and management by telemedicine. The patient expressed understanding and agreed to proceed.  Vital Signs: Because this visit was a virtual/telehealth visit, some criteria may be missing or patient reported. Any vitals not documented were not able to be obtained and vitals that have been documented are patient reported.  Cardiac Risk Factors include: hypertension;dyslipidemia     Objective:    Today's Vitals   12/11/22 1005  Weight: 199 lb (90.3 kg)  Height: 5\' 3"  (1.6 m)   Body mass index is 35.25 kg/m.     12/11/2022   10:09 AM 05/12/2020   10:15 AM 05/04/2020    3:57 PM 08/16/2014    7:19 AM  Advanced Directives  Does Patient Have a Medical Advance Directive? No Yes Yes No  Type of Special educational needs teacher of [place];Living will Healthcare Power of [place];Living will   Does patient want to make changes to medical advance directive?  No - Patient declined    Copy of Healthcare Power of Attorney in Chart?  No - copy requested    Would patient like information on creating a medical advance directive? Yes (MAU/Ambulatory/Procedural Areas - Information given)   No - patient declined information    Current Medications (verified) Outpatient Encounter Medications as of 12/11/2022  Medication Sig   atorvastatin (LIPITOR) 40 MG tablet Take 1 tablet (40 mg total) by mouth daily.   buPROPion (WELLBUTRIN XL) 150 MG 24 hr tablet Take 1 tablet (150 mg total) by mouth daily.   Calcium-Vitamin D 600-5 MG-MCG TABS Take 1 each by mouth daily.   cyanocobalamin (VITAMIN B12) 1000  MCG tablet Take 1 tablet (1,000 mcg total) by mouth daily.   levothyroxine (SYNTHROID) 50 MCG tablet Take 1 tablet (50 mcg total) by mouth daily.   meloxicam (MOBIC) 15 MG tablet Take 1 tablet (15 mg total) by mouth daily.   metFORMIN (GLUCOPHAGE) 500 MG tablet Take 1 tablet (500 mg total) by mouth 2 (two) times daily with a meal.   Olmesartan-amLODIPine-HCTZ 40-10-25 MG TABS TAKE ONE (1) TABLET BY MOUTH EVERY DAY   venlafaxine XR (EFFEXOR-XR) 150 MG 24 hr capsule Take 1 capsule (150 mg total) by mouth daily with breakfast.   No facility-administered encounter medications on file as of 12/11/2022.    Allergies (verified) Patient has no known allergies.   History: Past Medical History:  Diagnosis Date   Anemia    "YOUNGER"   Anxiety    Arthritis    HANDS   [facility] arthritis    left thumb   Depression    Gout    High cholesterol    Hypertension    Hypothyroidism    Secondary hypertension 02/05/2020   Sleep apnea    Past Surgical History:  Procedure Laterality Date   ABDOMINAL HYSTERECTOMY     2007   Carpal tunnel  2019   CARPOMETACARPEL SUSPENSION PLASTY Left 05/12/2020   Procedure: LEFT THUMB CARPOMETACARPAL ARTHROPLASTY;  Surgeon: Tarry Kos, MD;  Location: [facility];  Service: Orthopedics;  Laterality: Left;   thumb release  2019   Family History  Problem  Relation Age of Onset   Arthritis Mother    Alzheimer's disease Mother    Kidney failure Mother    Congenital heart disease Father    Melanoma Father    Healthy Son    Healthy Son    Blindness Son    Mental illness Son    Colon cancer Neg Hx    Colon polyps Neg Hx    Crohn's disease Neg Hx    Esophageal cancer Neg Hx    Stomach cancer Neg Hx    Rectal cancer Neg Hx    Ulcerative colitis Neg Hx    Breast cancer Neg Hx    Social History   Socioeconomic History   Marital status: Single    Spouse name: Not on file   Number of children: Not on file   Years of education: Not on file    Highest education level: GED or equivalent  Occupational History   Not on file  Tobacco Use   Smoking status: Never    Passive exposure: Past   Smokeless tobacco: Never  Vaping Use   Vaping status: Never Used  Substance and Sexual Activity   Alcohol use: Yes    Comment: social   Drug use: No   Sexual activity: Not on file  Other Topics Concern   Not on file  Social History Narrative   Not on file   Social Determinants of Health   Financial Resource Strain: Low Risk  (12/11/2022)   Overall Financial Resource Strain (CARDIA)    Difficulty of Paying Living Expenses: Not hard at all  Food Insecurity: No Food Insecurity (12/11/2022)   Hunger Vital Sign    Worried About Running Out of Food in the Last Year: Never true    Ran Out of Food in the Last Year: Never true  Transportation Needs: No Transportation Needs (12/11/2022)   PRAPARE - Administrator, Civil Service (Medical): No    Lack of Transportation (Non-Medical): No  Physical Activity: Inactive (12/11/2022)   Exercise Vital Sign    Days of Exercise per Week: 0 days    Minutes of Exercise per Session: 0 min  Stress: No Stress Concern Present (12/11/2022)   [employer] of Occupational Health - Occupational Stress Questionnaire    Feeling of Stress : Only a little  Recent Concern: Stress - Stress Concern Present (10/23/2022)   [employer] of Occupational Health - Occupational Stress Questionnaire    Feeling of Stress : To some extent  Social Connections: Socially Isolated (12/11/2022)   Social Connection and Isolation Panel [NHANES]    Frequency of Communication with Friends and Family: Never    Frequency of Social Gatherings with Friends and Family: Never    Attends Religious Services: Never    Diplomatic Services operational officer: No    Attends Engineer, structural: Never    Marital Status: Married    Tobacco Counseling Counseling given: Not Answered   Clinical  Intake:  Pre-visit preparation completed: Yes  Pain : No/denies pain     Diabetes: No  How often do you need to have someone help you when you read instructions, pamphlets, or other written materials from your doctor or pharmacy?: 1 - Never  Interpreter Needed?: No  Information entered by :: Kandis Fantasia LPN   Activities of Daily Living    12/11/2022   10:09 AM  In your present state of health, do you have any difficulty performing the following activities:  Hearing? 0  Vision? 0  Difficulty concentrating or making decisions? 0  Walking or climbing stairs? 0  Dressing or bathing? 0  Doing errands, shopping? 0  Preparing Food and eating ? N  Using the Toilet? N  In the past six months, have you accidently leaked urine? N  Do you have problems with loss of bowel control? N  Managing your Medications? N  Managing your Finances? N  Housekeeping or managing your Housekeeping? N    Patient Care Team: Marianne Sofia, Cordelia Poche as PCP - General (Physician Assistant) Glyn Ade, PA-C as Physician Assistant (Dermatology)  Indicate any recent Medical Services you may have received from other than [facility] providers in the past year (date may be approximate).     Assessment:   This is a routine wellness examination for Cintya.  Hearing/Vision screen Hearing Screening - Comments:: Denies hearing difficulties   Vision Screening - Comments:: No vision problems; will schedule routine eye exam soon     Goals Addressed             This Visit's Progress    Remain active and independent        Depression Screen    12/11/2022   10:20 AM 10/23/2022    1:18 PM 07/06/2022   10:07 AM 03/27/2022   10:14 AM 03/27/2022    9:21 AM 12/20/2021   12:01 PM 11/23/2021   11:26 AM  PHQ 2/9 Scores  PHQ - 2 Score 2 2 3 6 6 4 5   PHQ- 9 Score 4 4 9 20 17 14 15     Fall Risk    12/11/2022   10:08 AM 10/23/2022    1:18 PM 07/06/2022   10:07 AM 04/01/2021    9:03 AM 03/18/2020    8:42 AM   Fall Risk   Falls in the past year? 0 0 0 0 0  Number falls in past yr: 0 0 0 0 0  Injury with Fall? 0 0 0 0 0  Risk for fall due to : No Fall Risks No Fall Risks No Fall Risks    Follow up Falls prevention discussed;Education provided;Falls evaluation completed Falls evaluation completed Falls evaluation completed      MEDICARE RISK AT HOME: Medicare Risk at Home Any stairs in or around the home?: No If so, are there any without handrails?: No Home free of loose throw rugs in walkways, pet beds, electrical cords, etc?: Yes Adequate lighting in your home to reduce risk of falls?: Yes Life alert?: No Use of a cane, walker or w/c?: No Grab bars in the bathroom?: Yes Shower chair or bench in shower?: No Elevated toilet seat or a handicapped toilet?: Yes  TIMED UP AND GO:  Was the test performed? No    Cognitive Function:        12/11/2022   10:09 AM 02/05/2020   10:19 AM  6CIT Screen  What Year? 0 points 0 points  What month? 0 points 0 points  What time? 0 points 0 points  Count back from 20 0 points 0 points  Months in reverse 0 points 0 points  Repeat phrase 0 points 0 points  Total Score 0 points 0 points    Immunizations Immunization History  Administered Date(s) Administered   Influenza Inj Mdck Quad Pf 01/18/2021, 11/23/2021   Influenza Split 09/30/2017   Influenza, Mdck, Trivalent,PF 6+ MOS(egg free) 10/23/2022   Influenza,inj,Quad PF,6-35 Mos 10/21/2018, 01/06/2020   Influenza-Unspecified 09/30/2017   PFIZER(Purple Top)SARS-COV-2 Vaccination 06/26/2019, 07/18/2019   Pneumococcal  Polysaccharide-23 01/02/2019   Zoster Recombinant(Shingrix) 09/16/2020, 12/22/2020    TDAP status: Due, Education has been provided regarding the importance of this vaccine. Advised may receive this vaccine at local pharmacy or Health Dept. Aware to provide a copy of the vaccination record if obtained from local pharmacy or Health Dept. Verbalized acceptance and  understanding.  Flu Vaccine status: Up to date  Pneumococcal vaccine status: Up to date  Covid-19 vaccine status: Information provided on how to obtain vaccines.   Qualifies for Shingles Vaccine? Yes   Zostavax completed No   Shingrix Completed?: Yes  Screening Tests Health Maintenance  Topic Date Due   DTaP/Tdap/Td (1 - Tdap) Never done   MAMMOGRAM  03/09/2023   Medicare Annual Wellness (AWV)  12/11/2023   Colonoscopy  01/20/2032   INFLUENZA VACCINE  Completed   Zoster Vaccines- Shingrix  Completed   HPV VACCINES  Aged Out   COVID-19 Vaccine  Discontinued   Hepatitis C Screening  Discontinued   HIV Screening  Discontinued    Health Maintenance  Health Maintenance Due  Topic Date Due   DTaP/Tdap/Td (1 - Tdap) Never done    Colorectal cancer screening: Type of screening: Colonoscopy. Completed 01/19/22. Repeat every 10 years  Mammogram status: Completed 03/08/22. Repeat every year  Lung Cancer Screening: (Low Dose CT Chest recommended if Age 42-80 years, 20 pack-year currently smoking OR have quit w/in 15years.) does not qualify.   Lung Cancer Screening Referral: n/a  Additional Screening:  Hepatitis C Screening: does qualify; Patient declines  Vision Screening: Recommended annual ophthalmology exams for early detection of glaucoma and other disorders of the eye. Is the patient up to date with their annual eye exam?  No  Who is the provider or what is the name of the office in which the patient attends annual eye exams? none If pt is not established with a provider, would they like to be referred to a provider to establish care? No .   Dental Screening: Recommended annual dental exams for proper oral hygiene  Community Resource Referral / Chronic Care Management: CRR required this visit?  No   CCM required this visit?  No     Plan:     I have personally reviewed and noted the following in the patient's chart:   Medical and social history Use of alcohol,  tobacco or illicit drugs  Current medications and supplements including opioid prescriptions. Patient is not currently taking opioid prescriptions. Functional ability and status Nutritional status Physical activity Advanced directives List of other physicians Hospitalizations, surgeries, and ER visits in previous 12 months Vitals Screenings to include cognitive, depression, and falls Referrals and appointments  In addition, I have reviewed and discussed with patient certain preventive protocols, quality metrics, and best practice recommendations. A written personalized care plan for preventive services as well as general preventive health recommendations were provided to patient.     Kandis Fantasia [place], [place]   [id number]   After Visit Summary: (MyChart) Due to this being a telephonic visit, the after visit summary with patients personalized plan was offered to patient via MyChart   Nurse Notes: Patient states that she may need a new referral for a different dermatologist but will contact the office if needed.

## 2022-12-13 DIAGNOSIS — L281 Prurigo nodularis: Secondary | ICD-10-CM | POA: Diagnosis not present

## 2022-12-13 DIAGNOSIS — L209 Atopic dermatitis, unspecified: Secondary | ICD-10-CM | POA: Diagnosis not present

## 2022-12-13 DIAGNOSIS — L299 Pruritus, unspecified: Secondary | ICD-10-CM | POA: Diagnosis not present

## 2022-12-20 ENCOUNTER — Ambulatory Visit: Payer: Self-pay | Admitting: Physician Assistant

## 2022-12-20 ENCOUNTER — Ambulatory Visit (INDEPENDENT_AMBULATORY_CARE_PROVIDER_SITE_OTHER): Payer: Medicare HMO | Admitting: Physician Assistant

## 2022-12-20 VITALS — BP 138/70 | HR 76 | Temp 97.6°F | Resp 16 | Ht 63.0 in | Wt 194.6 lb

## 2022-12-20 DIAGNOSIS — N3000 Acute cystitis without hematuria: Secondary | ICD-10-CM

## 2022-12-20 DIAGNOSIS — R109 Unspecified abdominal pain: Secondary | ICD-10-CM | POA: Diagnosis not present

## 2022-12-20 DIAGNOSIS — R10A0 Flank pain, unspecified side: Secondary | ICD-10-CM | POA: Insufficient documentation

## 2022-12-20 DIAGNOSIS — I10 Essential (primary) hypertension: Secondary | ICD-10-CM

## 2022-12-20 LAB — POCT URINALYSIS DIP (CLINITEK)
Bilirubin, UA: NEGATIVE
Blood, UA: NEGATIVE
Glucose, UA: NEGATIVE mg/dL
Ketones, POC UA: NEGATIVE mg/dL
Nitrite, UA: NEGATIVE
POC PROTEIN,UA: NEGATIVE
Spec Grav, UA: <=1.005 — AB (ref 1.010–1.025)
Urobilinogen, UA: 0.2 U/dL
pH, UA: 8 (ref 5.0–8.0)

## 2022-12-20 NOTE — Assessment & Plan Note (Signed)
-  Start empiric treatment for possible kidney infection with antibiotics. -Advise patient to monitor symptoms and seek urgent care if symptoms worsen significantly or if new symptoms such as fever or vomiting develop.

## 2022-12-20 NOTE — Assessment & Plan Note (Addendum)
Patient has been experiencing lightheadedness with blood pressure medication due to significant weight loss. Has adjusted dose by halving the medication which has resolved the lightheadedness. -Continue half dose of blood pressure medication. -Advise patient to avoid NSAIDs due to potential kidney effects and to use acetaminophen for pain relief instead.

## 2022-12-20 NOTE — Telephone Encounter (Signed)
Chief Complaint: back pain Symptoms: back pain Frequency: 3 days Pertinent Negatives: Patient denies fever, GU s/s, injury, previous PE/DVT, denies long trips via plane/car. Denies loss of bladder/bowel Disposition: [] ED /[] Urgent Care (no appt availability in office) / [x] Appointment(In office/virtual)/ []  [facility]/ [] Home Care/ [] Refused Recommended Disposition /[] [facility]/ []  Follow-up with PCP Additional Notes: Pt with back pain x3 days. Denies injury. Denies GU s/s. + CMS. Denies loss of bladder/bowel. Scheduled for today at 1600. Copied from CRM [id number]. Topic: Clinical - Red Word Triage >> Dec 20, 2022 11:23 AM Dennison Nancy wrote: Red Word that prompted transfer to Nurse Triage:  3 days having  back pain , medication taking making patient lighted headed Olmesartan-amLODIPine-HCTZ 40-10-25 MG TABS . Call back # [phone] Reason for Disposition  [1] MODERATE back pain (e.g., interferes with normal activities) AND [2] present > 3 days  Answer Assessment - Initial Assessment Questions 1. ONSET: "When did the pain begin?"      3 days 2. LOCATION: "Where does it hurt?" (upper, mid or lower back)     Bilaterally along the bottom of the rib cage 3. SEVERITY: "How bad is the pain?"  (e.g., Scale 1-10; mild, moderate, or severe)   - MILD (1-3): Doesn't interfere with normal activities.    - MODERATE (4-7): Interferes with normal activities or awakens from sleep.    - SEVERE (8-10): Excruciating pain, unable to do any normal activities.      9 4. PATTERN: "Is the pain constant?" (e.g., yes, no; constant, intermittent)      Yes with movement. 5. RADIATION: "Does the pain shoot into your legs or somewhere else?"     Denies 6. CAUSE:  "What do you think is causing the back pain?"      Denies injury 7. BACK OVERUSE:  "Any recent lifting of heavy objects, strenuous work or exercise?"     Denies 8. MEDICINES: "What have you taken so far for the pain?" (e.g., nothing,  acetaminophen, NSAIDS)     Ibuprofen, other home remedies 9. NEUROLOGIC SYMPTOMS: "Do you have any weakness, numbness, or problems with bowel/bladder control?"     denies 10. OTHER SYMPTOMS: "Do you have any other symptoms?" (e.g., fever, abdomen pain, burning with urination, blood in urine)       denies  Protocols used: Back Pain-A-AH

## 2022-12-20 NOTE — Assessment & Plan Note (Signed)
New onset, bilateral lower back pain for 3 days. No history of trauma or injury. Pain is not relieved by ibuprofen or Goody Powders. Pain is worse with bending forward and sideways. No urinary symptoms. No history of kidney stones. -Collect urine sample for urinalysis and culture. -Order blood work including CBC and CMP. -Order ultrasound of kidneys. -Start empiric treatment for possible kidney infection with antibiotics. -Advise patient to monitor symptoms and seek urgent care if symptoms worsen significantly or if new symptoms such as fever or vomiting develop.

## 2022-12-20 NOTE — Progress Notes (Signed)
Acute Office Visit  Subjective:    Patient ID: Danielle Jones, female    DOB: [birth year], 62 y.o.   MRN: [id number]  Chief Complaint  Patient presents with   Back Pain    HPI: Discussed the use of AI scribe software for clinical note transcription with the patient, who gave verbal consent to proceed.  History of Present Illness   The patient presents with a three-day history of back pain. The pain is located in the center of the back, at the edges of the ribs and sides, and extends upwards. The patient describes the pain as severe, not relieved by ibuprofen or Goody Powders, and has not found a comfortable position to alleviate the pain. The patient denies any recent injuries, strenuous activities, or any precipitating events. The patient also reports a significant weight loss of forty pounds since June. Patient denies any history of UTI or recent burning or stinging with urination. Patient also denies any recent fevers, or sicknesses    Patient denies any history of kidney stones   Past Medical History:  Diagnosis Date   Anemia    "YOUNGER"   Anxiety    Arthritis    HANDS   CMC arthritis    left thumb   Depression    Gout    High cholesterol    Hypertension    Hypothyroidism    Secondary hypertension 02/05/2020   Sleep apnea     Past Surgical History:  Procedure Laterality Date   ABDOMINAL HYSTERECTOMY     2007   Carpal tunnel  2019   CARPOMETACARPEL SUSPENSION PLASTY Left 05/12/2020   Procedure: LEFT THUMB CARPOMETACARPAL ARTHROPLASTY;  Surgeon: Tarry Kos, MD;  Location: [facility];  Service: Orthopedics;  Laterality: Left;   thumb release  2019    Family History  Problem Relation Age of Onset   Arthritis Mother    Alzheimer's disease Mother    Kidney failure Mother    Congenital heart disease Father    Melanoma Father    Healthy Son    Healthy Son    Blindness Son    Mental illness Son    Colon cancer Neg Hx    Colon polyps Neg Hx     Crohn's disease Neg Hx    Esophageal cancer Neg Hx    Stomach cancer Neg Hx    Rectal cancer Neg Hx    Ulcerative colitis Neg Hx    Breast cancer Neg Hx     Social History   Socioeconomic History   Marital status: Single    Spouse name: Not on file   Number of children: Not on file   Years of education: Not on file   Highest education level: Associate degree: occupational, Scientist, product/process development, or vocational program  Occupational History   Not on file  Tobacco Use   Smoking status: Never    Passive exposure: Past   Smokeless tobacco: Never  Vaping Use   Vaping status: Never Used  Substance and Sexual Activity   Alcohol use: Yes    Comment: social   Drug use: No   Sexual activity: Not on file  Other Topics Concern   Not on file  Social History Narrative   Not on file   Social Determinants of Health   Financial Resource Strain: Low Risk  (12/20/2022)   Overall Financial Resource Strain (CARDIA)    Difficulty of Paying Living Expenses: Not very hard  Food Insecurity: No Food Insecurity (12/20/2022)  Hunger Vital Sign    Worried About Running Out of Food in the Last Year: Never true    Ran Out of Food in the Last Year: Never true  Transportation Needs: No Transportation Needs (12/20/2022)   PRAPARE - Administrator, Civil Service (Medical): No    Lack of Transportation (Non-Medical): No  Physical Activity: Inactive (12/20/2022)   Exercise Vital Sign    Days of Exercise per Week: 0 days    Minutes of Exercise per Session: 0 min  Stress: No Stress Concern Present (12/20/2022)   [employer] of Occupational Health - Occupational Stress Questionnaire    Feeling of Stress : Only a little  Recent Concern: Stress - Stress Concern Present (10/23/2022)   [employer] of Occupational Health - Occupational Stress Questionnaire    Feeling of Stress : To some extent  Social Connections: Moderately Isolated (12/20/2022)   Social Connection and Isolation Panel  [NHANES]    Frequency of Communication with Friends and Family: Twice a week    Frequency of Social Gatherings with Friends and Family: Twice a week    Attends Religious Services: Never    Database administrator or Organizations: No    Attends Banker Meetings: Never    Marital Status: Married  Catering manager Violence: Not At Risk (12/11/2022)   Humiliation, Afraid, Rape, and Kick questionnaire    Fear of Current or Ex-Partner: No    Emotionally Abused: No    Physically Abused: No    Sexually Abused: No    Outpatient Medications Prior to Visit  Medication Sig Dispense Refill   atorvastatin (LIPITOR) 40 MG tablet Take 1 tablet (40 mg total) by mouth daily. 90 tablet 1   buPROPion (WELLBUTRIN XL) 150 MG 24 hr tablet Take 1 tablet (150 mg total) by mouth daily. 30 tablet 5   Calcium-Vitamin D 600-5 MG-MCG TABS Take 1 each by mouth daily. 90 tablet 1   cyanocobalamin (VITAMIN B12) 1000 MCG tablet Take 1 tablet (1,000 mcg total) by mouth daily. 90 tablet 0   Dupilumab (DUPIXENT) 300 MG/2ML SOAJ Inject 300 mg into the skin every 14 (fourteen) days.     levothyroxine (SYNTHROID) 50 MCG tablet Take 1 tablet (50 mcg total) by mouth daily. 90 tablet 1   metFORMIN (GLUCOPHAGE) 500 MG tablet Take 1 tablet (500 mg total) by mouth 2 (two) times daily with a meal. 180 tablet 1   Olmesartan-amLODIPine-HCTZ 40-10-25 MG TABS TAKE ONE (1) TABLET BY MOUTH EVERY DAY 90 tablet 1   venlafaxine XR (EFFEXOR-XR) 150 MG 24 hr capsule Take 1 capsule (150 mg total) by mouth daily with breakfast. 90 capsule 1   meloxicam (MOBIC) 15 MG tablet Take 1 tablet (15 mg total) by mouth daily. 90 tablet 1   No facility-administered medications prior to visit.    No Known Allergies  Review of Systems  Constitutional:  Negative for chills, fatigue and fever.  HENT:  Negative for congestion, ear pain and sore throat.   Respiratory:  Negative for cough and shortness of breath.   Cardiovascular:  Negative  for chest pain.  Gastrointestinal:  Negative for abdominal pain, nausea and rectal pain.  Genitourinary:  Positive for flank pain. Negative for difficulty urinating, dysuria, enuresis and hematuria.  Musculoskeletal:  Positive for back pain and myalgias.  Neurological:  Negative for headaches.       Objective:        12/20/2022    3:50 PM 12/11/2022   10:05  AM 10/23/2022    1:14 PM  Vitals with BMI  Height 5\' 3"  5\' 3"  5\' 3"   Weight 194 lbs 10 oz 199 lbs 199 lbs 13 oz  BMI 34.48 35.26 35.4  Systolic 138 -- 124  Diastolic 70 -- 80  Pulse 76  75    Orthostatic VS for the past 72 hrs (Last 3 readings):  Patient Position BP Location Cuff Size  12/20/22 1550 Sitting Left Arm Large     Physical Exam Vitals reviewed.  Constitutional:      Appearance: Normal appearance.  Cardiovascular:     Rate and Rhythm: Normal rate and regular rhythm.     Heart sounds: Normal heart sounds.  Pulmonary:     Effort: Pulmonary effort is normal.     Breath sounds: Normal breath sounds.  Abdominal:     General: Bowel sounds are normal.     Palpations: Abdomen is soft.     Tenderness: There is no abdominal tenderness. There is right CVA tenderness and left CVA tenderness.  Neurological:     Mental Status: She is alert and oriented to person, place, and time.  Psychiatric:        Mood and Affect: Mood normal.        Behavior: Behavior normal.     Health Maintenance Due  Topic Date Due   DTaP/Tdap/Td (1 - Tdap) Never done    There are no preventive care reminders to display for this patient.   Lab Results  Component Value Date   TSH 2.700 10/23/2022   Lab Results  Component Value Date   WBC 7.0 10/23/2022   HGB 11.8 10/23/2022   HCT 37.4 10/23/2022   MCV 94 10/23/2022   PLT 377 10/23/2022   Lab Results  Component Value Date   NA 142 10/23/2022   K 4.6 10/23/2022   CO2 24 10/23/2022   GLUCOSE 86 10/23/2022   BUN 20 10/23/2022   CREATININE 1.02 (H) 10/23/2022   BILITOT  0.6 10/23/2022   ALKPHOS 105 10/23/2022   AST 17 10/23/2022   ALT 21 10/23/2022   PROT 6.6 10/23/2022   ALBUMIN 4.6 10/23/2022   CALCIUM 9.8 10/23/2022   ANIONGAP 8 05/10/2020   EGFR 62 10/23/2022   Lab Results  Component Value Date   CHOL 194 10/23/2022   Lab Results  Component Value Date   HDL 58 10/23/2022   Lab Results  Component Value Date   LDLCALC 114 (H) 10/23/2022   Lab Results  Component Value Date   TRIG 124 10/23/2022   Lab Results  Component Value Date   CHOLHDL 3.3 10/23/2022   Lab Results  Component Value Date   HGBA1C 6.0 (H) 10/23/2022       Assessment & Plan:  Flank pain, acute Assessment & Plan: -Start empiric treatment for possible kidney infection with antibiotics. -Advise patient to monitor symptoms and seek urgent care if symptoms worsen significantly or if new symptoms such as fever or vomiting develop.  Orders: -     POCT URINALYSIS DIP (CLINITEK) -     CBC with Differential/Platelet -     Comprehensive metabolic panel -     US Abdomen Complete; Future  Acute cystitis without hematuria Assessment & Plan: New onset, bilateral lower back pain for 3 days. No history of trauma or injury. Pain is not relieved by ibuprofen or Goody Powders. Pain is worse with bending forward and sideways. No urinary symptoms. No history of kidney stones. -Collect urine sample for urinalysis  and culture. -Order blood work including CBC and CMP. -Order ultrasound of kidneys. -Start empiric treatment for possible kidney infection with antibiotics. -Advise patient to monitor symptoms and seek urgent care if symptoms worsen significantly or if new symptoms such as fever or vomiting develop.  Orders: -     Urine Culture  Primary hypertension Assessment & Plan: Patient has been experiencing lightheadedness with blood pressure medication due to significant weight loss. Has adjusted dose by halving the medication which has resolved the  lightheadedness. -Continue half dose of blood pressure medication. -Advise patient to avoid NSAIDs due to potential kidney effects and to use acetaminophen for pain relief instead.      No orders of the defined types were placed in this encounter.   Orders Placed This Encounter  Procedures   Urine Culture   US Abdomen Complete   CBC with Differential/Platelet   Comprehensive metabolic panel   POCT URINALYSIS DIP (CLINITEK)   Assessment and Plan        Follow-up: No follow-ups on file.  An After Visit Summary was printed and given to the patient.  Langley Gauss, [place] [facility] [phone]

## 2022-12-21 ENCOUNTER — Other Ambulatory Visit: Payer: Self-pay

## 2022-12-21 ENCOUNTER — Other Ambulatory Visit: Payer: Self-pay | Admitting: Physician Assistant

## 2022-12-21 DIAGNOSIS — R109 Unspecified abdominal pain: Secondary | ICD-10-CM

## 2022-12-21 LAB — CBC WITH DIFFERENTIAL/PLATELET
Basophils Absolute: 0.1 10*3/uL (ref 0.0–0.2)
Basos: 1 %
EOS (ABSOLUTE): 0.2 10*3/uL (ref 0.0–0.4)
Eos: 2 %
Hematocrit: 37 % (ref 34.0–46.6)
Hemoglobin: 12 g/dL (ref 11.1–15.9)
Immature Grans (Abs): 0 10*3/uL (ref 0.0–0.1)
Immature Granulocytes: 0 %
Lymphocytes Absolute: 2.5 10*3/uL (ref 0.7–3.1)
Lymphs: 31 %
MCH: 29.8 pg (ref 26.6–33.0)
MCHC: 32.4 g/dL (ref 31.5–35.7)
MCV: 92 fL (ref 79–97)
Monocytes Absolute: 0.7 10*3/uL (ref 0.1–0.9)
Monocytes: 9 %
Neutrophils Absolute: 4.6 10*3/uL (ref 1.4–7.0)
Neutrophils: 57 %
Platelets: 321 10*3/uL (ref 150–450)
RBC: 4.03 x10E6/uL (ref 3.77–5.28)
RDW: 13.2 % (ref 11.7–15.4)
WBC: 8 10*3/uL (ref 3.4–10.8)

## 2022-12-21 LAB — COMPREHENSIVE METABOLIC PANEL
ALT: 21 [IU]/L (ref 0–32)
AST: 16 [IU]/L (ref 0–40)
Albumin: 4.6 g/dL (ref 3.9–4.9)
Alkaline Phosphatase: 107 [IU]/L (ref 44–121)
BUN/Creatinine Ratio: 11 — ABNORMAL LOW (ref 12–28)
BUN: 12 mg/dL (ref 8–27)
Bilirubin Total: 0.5 mg/dL (ref 0.0–1.2)
CO2: 25 mmol/L (ref 20–29)
Calcium: 10.4 mg/dL — ABNORMAL HIGH (ref 8.7–10.3)
Chloride: 102 mmol/L (ref 96–106)
Creatinine, Ser: 1.07 mg/dL — ABNORMAL HIGH (ref 0.57–1.00)
Globulin, Total: 2.2 g/dL (ref 1.5–4.5)
Glucose: 90 mg/dL (ref 70–99)
Potassium: 4.3 mmol/L (ref 3.5–5.2)
Sodium: 143 mmol/L (ref 134–144)
Total Protein: 6.8 g/dL (ref 6.0–8.5)
eGFR: 58 mL/min/{1.73_m2} — ABNORMAL LOW (ref >59)

## 2022-12-21 MED ORDER — CIPROFLOXACIN HCL 500 MG PO TABS
500.0000 mg | ORAL_TABLET | Freq: Two times a day (BID) | ORAL | 0 refills | Status: AC
Start: 2022-12-21 — End: 2022-12-28

## 2022-12-22 LAB — URINE CULTURE: Organism ID, Bacteria: NO GROWTH

## 2023-01-05 ENCOUNTER — Ambulatory Visit: Payer: Medicare HMO | Admitting: Physician Assistant

## 2023-01-08 ENCOUNTER — Other Ambulatory Visit: Payer: Self-pay

## 2023-01-08 DIAGNOSIS — F325 Major depressive disorder, single episode, in full remission: Secondary | ICD-10-CM

## 2023-01-08 DIAGNOSIS — I10 Essential (primary) hypertension: Secondary | ICD-10-CM

## 2023-01-09 ENCOUNTER — Other Ambulatory Visit: Payer: Self-pay

## 2023-01-09 DIAGNOSIS — I10 Essential (primary) hypertension: Secondary | ICD-10-CM

## 2023-01-09 DIAGNOSIS — F325 Major depressive disorder, single episode, in full remission: Secondary | ICD-10-CM

## 2023-01-09 MED ORDER — VENLAFAXINE HCL ER 150 MG PO CP24
150.0000 mg | ORAL_CAPSULE | Freq: Every day | ORAL | 0 refills | Status: DC
Start: 2023-01-09 — End: 2023-03-28

## 2023-01-19 ENCOUNTER — Other Ambulatory Visit: Payer: Self-pay | Admitting: Physician Assistant

## 2023-02-08 ENCOUNTER — Ambulatory Visit: Payer: Medicare (Managed Care)

## 2023-02-12 ENCOUNTER — Ambulatory Visit (INDEPENDENT_AMBULATORY_CARE_PROVIDER_SITE_OTHER): Payer: Medicare (Managed Care) | Admitting: Physician Assistant

## 2023-02-12 ENCOUNTER — Encounter: Payer: Self-pay | Admitting: Physician Assistant

## 2023-02-12 VITALS — BP 136/80 | HR 75 | Temp 97.2°F | Resp 18 | Wt 197.6 lb

## 2023-02-12 DIAGNOSIS — I10 Essential (primary) hypertension: Secondary | ICD-10-CM | POA: Diagnosis not present

## 2023-02-12 DIAGNOSIS — L281 Prurigo nodularis: Secondary | ICD-10-CM | POA: Insufficient documentation

## 2023-02-12 MED ORDER — TRIAMCINOLONE ACETONIDE 0.1 % EX CREA: 1.0000 | TOPICAL_CREAM | Freq: Two times a day (BID) | CUTANEOUS | 1 refills | Status: DC

## 2023-02-12 MED ORDER — AMLODIPINE-OLMESARTAN 5-20 MG PO TABS: 1.0000 | ORAL_TABLET | Freq: Every day | ORAL | 2 refills | Status: DC

## 2023-02-12 NOTE — Progress Notes (Signed)
 Acute Office Visit  Subjective:    Patient ID: Danielle Jones, female    DOB: [birth year], 63 y.o.   MRN: [id number]  Chief Complaint  Patient presents with   Rash    HPI: Patient is in today for complaints of rash on upper legs - she states it has been there for about 5 months.  She has seen dermatology and given rx for dupixent and told it was eczema but pt feels her rash looks different than that She took the dupixent only a few months and stopped it - it actually was helping slightly She also states she got in tanning bed and it helped the spots on her buttocks  Pt with hypertension - over the spring months she lost weight (tried to lose) - has maintained since that time but noted bp was dropping and felt light headed.  She has decreased her bp med to 1/2 tablet daily and that has improved her symptoms - she is taking olmesartan /amlodipine /hctz 40-10-25 -- recommend we can try azor  5/20 in place of that medication to see if controls well instead She denies chest pain or shortness of breath - no swelling   Current Outpatient Medications:    amLODipine -olmesartan  (AZOR ) 5-20 MG tablet, Take 1 tablet by mouth daily., Disp: 30 tablet, Rfl: 2   atorvastatin  (LIPITOR) 40 MG tablet, TAKE 1 TABLET DAILY, Disp: 90 tablet, Rfl: 1   buPROPion  (WELLBUTRIN  XL) 150 MG 24 hr tablet, Take 1 tablet (150 mg total) by mouth daily., Disp: 30 tablet, Rfl: 5   Calcium -Vitamin D  600-5 MG-MCG TABS, Take 1 each by mouth daily., Disp: 90 tablet, Rfl: 1   cyanocobalamin  (VITAMIN B12) 1000 MCG tablet, Take 1 tablet (1,000 mcg total) by mouth daily., Disp: 90 tablet, Rfl: 0   Dupilumab (DUPIXENT) 300 MG/2ML SOAJ, Inject 300 mg into the skin every 14 (fourteen) days., Disp: , Rfl:    levothyroxine  (SYNTHROID ) 50 MCG tablet, Take 1 tablet (50 mcg total) by mouth daily., Disp: 90 tablet, Rfl: 1   metFORMIN  (GLUCOPHAGE ) 500 MG tablet, Take 1 tablet (500 mg total) by mouth 2 (two) times daily with a meal., Disp: 180  tablet, Rfl: 1   triamcinolone  cream (KENALOG ) 0.1 %, Apply 1 Application topically 2 (two) times daily., Disp: 45 g, Rfl: 1   venlafaxine  XR (EFFEXOR -XR) 150 MG 24 hr capsule, Take 1 capsule (150 mg total) by mouth daily with breakfast., Disp: 90 capsule, Rfl: 0  No Known Allergies  ROS CONSTITUTIONAL: Negative for chills, fatigue, fever,  E/N/T: Negative for ear pain, nasal congestion and sore throat.  CARDIOVASCULAR: Negative for chest pain, dizziness, palpitations and pedal edema.  RESPIRATORY: Negative for recent cough and dyspnea.  GASTROINTESTINAL: Negative for abdominal pain, acid reflux symptoms, constipation, diarrhea, nausea and vomiting.  MSK: Negative for arthralgias and myalgias.  INTEGUMENTARY:see HPI      Objective:    PHYSICAL EXAM:   BP 136/80 (BP Location: Left Arm, Patient Position: Sitting, Cuff Size: Large)   Pulse 75   Temp (!) 97.2 F (36.2 C) (Temporal)   Resp 18   Wt 197 lb 9.6 oz (89.6 kg)   SpO2 98%   BMI 35.00 kg/m    GEN: Well nourished, well developed, in no acute distress  Cardiac: RRR; no murmurs, rubs, or gallops,no edema - n Respiratory:  normal respiratory rate and pattern with no distress - normal breath sounds with no rales, rhonchi, wheezes or rubs Skin: lnodular lesions noted on upper legs and buttocks -  pt states are pruritic Psych: euthymic mood, appropriate affect and demeanor     Assessment & Plan:    Primary hypertension -     amLODIPine -Olmesartan ; Take 1 tablet by mouth daily.  Dispense: 30 tablet; Refill: 2  Prurigo nodularis -     Triamcinolone  Acetonide; Apply 1 Application topically 2 (two) times daily.  Dispense: 45 g; Refill: 1  Recommend to restart dupixent Consider UV therapy  Follow-up: Return for in March for chronic fasting follow up.  An After Visit Summary was printed and given to the patient.  Danielle Jones [facility] [phone]

## 2023-02-13 ENCOUNTER — Encounter: Payer: Medicare HMO | Admitting: Family Medicine

## 2023-03-09 ENCOUNTER — Other Ambulatory Visit: Payer: Self-pay | Admitting: Physician Assistant

## 2023-03-09 DIAGNOSIS — E039 Hypothyroidism, unspecified: Secondary | ICD-10-CM

## 2023-03-09 NOTE — Telephone Encounter (Signed)
 Per chart, patient had remaining refills for requested medication. This RN advised patient to call pharmacy to refill medication. Patient complied.

## 2023-03-28 ENCOUNTER — Other Ambulatory Visit: Payer: Self-pay

## 2023-03-28 DIAGNOSIS — F325 Major depressive disorder, single episode, in full remission: Secondary | ICD-10-CM

## 2023-03-28 DIAGNOSIS — I10 Essential (primary) hypertension: Secondary | ICD-10-CM

## 2023-03-28 DIAGNOSIS — E039 Hypothyroidism, unspecified: Secondary | ICD-10-CM

## 2023-03-28 MED ORDER — VENLAFAXINE HCL ER 150 MG PO CP24: 150.0000 mg | ORAL_CAPSULE | Freq: Every day | ORAL | 1 refills | Status: DC

## 2023-03-28 MED ORDER — AMLODIPINE-OLMESARTAN 5-20 MG PO TABS: 1.0000 | ORAL_TABLET | Freq: Every day | ORAL | 1 refills | Status: DC

## 2023-03-28 MED ORDER — LEVOTHYROXINE SODIUM 50 MCG PO TABS: 50.0000 ug | ORAL_TABLET | Freq: Every day | ORAL | 1 refills | Status: DC

## 2023-04-11 ENCOUNTER — Other Ambulatory Visit: Payer: Self-pay

## 2023-04-11 DIAGNOSIS — E039 Hypothyroidism, unspecified: Secondary | ICD-10-CM

## 2023-04-11 DIAGNOSIS — F325 Major depressive disorder, single episode, in full remission: Secondary | ICD-10-CM

## 2023-04-11 DIAGNOSIS — I10 Essential (primary) hypertension: Secondary | ICD-10-CM

## 2023-04-11 MED ORDER — VENLAFAXINE HCL ER 150 MG PO CP24: 150.0000 mg | ORAL_CAPSULE | Freq: Every day | ORAL | 0 refills | Status: DC

## 2023-04-11 MED ORDER — LEVOTHYROXINE SODIUM 50 MCG PO TABS: 50.0000 ug | ORAL_TABLET | Freq: Every day | ORAL | 0 refills | Status: DC

## 2023-04-11 MED ORDER — AMLODIPINE-OLMESARTAN 5-20 MG PO TABS: 1.0000 | ORAL_TABLET | Freq: Every day | ORAL | 0 refills | Status: DC

## 2023-04-17 ENCOUNTER — Other Ambulatory Visit: Payer: Self-pay | Admitting: Physician Assistant

## 2023-04-17 DIAGNOSIS — E039 Hypothyroidism, unspecified: Secondary | ICD-10-CM

## 2023-04-17 NOTE — Telephone Encounter (Signed)
 Copied from CRM [id number]. Topic: Clinical - Medication Refill >> Apr 17, 2023  3:03 PM Macon Large wrote: Most Recent Primary Care Visit:  Provider: Marianne Sofia  Department: [facility]  Visit Type: ACUTE  Date: 02/12/2023  Medication: levothyroxine (SYNTHROID) 50 MCG tablet  Has the patient contacted their pharmacy? Yes (Agent: If no, request that the patient contact the pharmacy for the refill. If patient does not wish to contact the pharmacy document the reason why and proceed with request.) (Agent: If yes, when and what did the pharmacy advise?)  Is this the correct pharmacy for this prescription? Yes If no, delete pharmacy and type the correct one.  This is the patient's preferred pharmacy:   [facility] [address] - [place], [place] - [address] [place] [address] Phone: [phone] Fax: [phone]  Has the prescription been filled recently? No  Is the patient out of the medication? Yes  Has the patient been seen for an appointment in the last year OR does the patient have an upcoming appointment? Yes  Can we respond through MyChart? No  Agent: Please be advised that Rx refills may take up to 3 business days. We ask that you follow-up with your pharmacy.

## 2023-04-19 MED ORDER — LEVOTHYROXINE SODIUM 50 MCG PO TABS: 50.0000 ug | ORAL_TABLET | Freq: Every day | ORAL | 0 refills | Status: DC

## 2023-04-23 ENCOUNTER — Ambulatory Visit (INDEPENDENT_AMBULATORY_CARE_PROVIDER_SITE_OTHER): Payer: Medicare (Managed Care) | Admitting: Physician Assistant

## 2023-04-23 ENCOUNTER — Encounter: Payer: Self-pay | Admitting: Physician Assistant

## 2023-04-23 VITALS — BP 148/80 | HR 76 | Temp 98.2°F | Resp 18 | Ht 63.0 in | Wt 209.0 lb

## 2023-04-23 DIAGNOSIS — I10 Essential (primary) hypertension: Secondary | ICD-10-CM | POA: Diagnosis not present

## 2023-04-23 DIAGNOSIS — E782 Mixed hyperlipidemia: Secondary | ICD-10-CM

## 2023-04-23 DIAGNOSIS — E039 Hypothyroidism, unspecified: Secondary | ICD-10-CM | POA: Diagnosis not present

## 2023-04-23 DIAGNOSIS — Z1231 Encounter for screening mammogram for malignant neoplasm of breast: Secondary | ICD-10-CM

## 2023-04-23 DIAGNOSIS — R7303 Prediabetes: Secondary | ICD-10-CM | POA: Diagnosis not present

## 2023-04-23 DIAGNOSIS — R7989 Other specified abnormal findings of blood chemistry: Secondary | ICD-10-CM

## 2023-04-23 DIAGNOSIS — R053 Chronic cough: Secondary | ICD-10-CM

## 2023-04-23 MED ORDER — MONTELUKAST SODIUM 10 MG PO TABS
10.0000 mg | ORAL_TABLET | Freq: Every day | ORAL | 3 refills | Status: DC
Start: 2023-04-23 — End: 2023-04-24

## 2023-04-23 NOTE — Progress Notes (Signed)
 Subjective:  Patient ID: Danielle Jones, female    DOB: July 29, 1960  Age: 63 y.o. MRN: [id number]  Chief Complaint  Patient presents with   Hypothyroidism    HPI Mixed hyperlipidemia  Pt presents with hyperlipidemia. Compliance with treatment has been good The patient is compliant with medications, maintains a low cholesterol diet , follows up as directed , and maintains an exercise regimen . The patient denies experiencing any hypercholesterolemia related symptoms. Taking lipitor 40mg  qd  Pt with hypothyroidism - taking synthroid qd - due for labwork  Pt with NIDDM - taking glucophage 500mg  and voices no concerns or problems - not having any significantly elevated glucose readings  Pt with anxiety - stable on effexor and wellbutrin  Pt presents for follow up of hypertension. The patient is tolerating the medication well without side effects. Compliance with treatment has been good; including taking medication as directed , maintains a healthy diet and regular exercise regimen , and following up as directed. Currently taking olmesartan/amlodipine/hctz 5/20 mg Denies chest pain or shortness of breath  Pt complains of chronic cough for the past several weeks - feels like she has a 'hairball' - cough is nonproductive She  is also having lower bilateral chest wall pain at the back of her ribs - sore with deep breathing  Pt would like to schedule mammogram     04/23/2023    9:26 AM 12/11/2022   10:20 AM 10/23/2022    1:18 PM 07/06/2022   10:07 AM 03/27/2022   10:14 AM  Depression screen PHQ 2/9  Decreased Interest 2 1 1 2 3   Down, Depressed, Hopeless 2 1 1 1 3   PHQ - 2 Score 4 2 2 3 6   Altered sleeping 2 1 1 1 2   Tired, decreased energy 2 1 1 2 3   Change in appetite 0 0 0 1 3  Feeling bad or failure about yourself  2 0 0 1 3  Trouble concentrating 2 0 0 1 2  Moving slowly or fidgety/restless 0 0 0 0 1  Suicidal thoughts 0 0 0 0 0  PHQ-9 Score 12 4 4 9 20   Difficult doing  work/chores Somewhat difficult  Not difficult at all Somewhat difficult Very difficult        04/01/2021    9:03 AM 07/06/2022   10:07 AM 10/23/2022    1:18 PM 12/11/2022   10:08 AM 04/23/2023    9:26 AM  Fall Risk  Falls in the past year? 0 0 0 0 0  Was there an injury with Fall? 0 0 0 0 0  Fall Risk Category Calculator 0 0 0 0 0  Fall Risk Category (Retired) Low      (RETIRED) Patient Fall Risk Level Low fall risk      Patient at Risk for Falls Due to  No Fall Risks No Fall Risks No Fall Risks No Fall Risks  Fall risk Follow up  Falls evaluation completed Falls evaluation completed Falls prevention discussed;Education provided;Falls evaluation completed Falls evaluation completed     CONSTITUTIONAL: Negative for chills, fatigue, fever, unintentional weight gain and unintentional weight loss.  E/N/T: Negative for ear pain, nasal congestion and sore throat.  CARDIOVASCULAR: Negative for chest pain, dizziness, palpitations and pedal edema.  RESPIRATORY: see HPI GASTROINTESTINAL: Negative for abdominal pain, acid reflux symptoms, constipation, diarrhea, nausea and vomiting.  MSK: Negative for arthralgias and myalgias.  INTEGUMENTARY: Negative for rash.  NEUROLOGICAL: Negative for dizziness and headaches.  PSYCHIATRIC: Negative for  sleep disturbance and to question depression screen.  Negative for depression, negative for anhedonia.       Current Outpatient Medications:    amLODipine-olmesartan (AZOR) 5-20 MG tablet, Take 1 tablet by mouth daily., Disp: 90 tablet, Rfl: 0   atorvastatin (LIPITOR) 40 MG tablet, TAKE 1 TABLET DAILY, Disp: 90 tablet, Rfl: 1   buPROPion (WELLBUTRIN XL) 150 MG 24 hr tablet, Take 1 tablet (150 mg total) by mouth daily., Disp: 30 tablet, Rfl: 5   Calcium-Vitamin D 600-5 MG-MCG TABS, Take 1 each by mouth daily., Disp: 90 tablet, Rfl: 1   cyanocobalamin (VITAMIN B12) 1000 MCG tablet, Take 1 tablet (1,000 mcg total) by mouth daily., Disp: 90 tablet, Rfl: 0    levothyroxine (SYNTHROID) 50 MCG tablet, Take 1 tablet (50 mcg total) by mouth daily., Disp: 90 tablet, Rfl: 0   metFORMIN (GLUCOPHAGE) 500 MG tablet, Take 1 tablet (500 mg total) by mouth 2 (two) times daily with a meal., Disp: 180 tablet, Rfl: 1   montelukast (SINGULAIR) 10 MG tablet, Take 1 tablet (10 mg total) by mouth at bedtime., Disp: 30 tablet, Rfl: 3   triamcinolone cream (KENALOG) 0.1 %, Apply 1 Application topically 2 (two) times daily., Disp: 45 g, Rfl: 1   venlafaxine XR (EFFEXOR-XR) 150 MG 24 hr capsule, Take 1 capsule (150 mg total) by mouth daily with breakfast., Disp: 90 capsule, Rfl: 0  Past Medical History:  Diagnosis Date   Anemia    "YOUNGER"   Anxiety    Arthritis    HANDS   CMC arthritis    left thumb   Depression    Gout    High cholesterol    Hypertension    Hypothyroidism    Secondary hypertension 02/05/2020   Sleep apnea    Objective:  PHYSICAL EXAM:   VS: BP (!) 148/80   Pulse 76   Temp 98.2 F (36.8 C) (Temporal)   Resp 18   Ht 5\' 3"  (1.6 m)   Wt 209 lb (94.8 kg)   SpO2 97%   BMI 37.02 kg/m   GEN: Well nourished, well developed, in no acute distress   Cardiac: RRR; no murmurs, rubs, or gallops,no edema -  Respiratory:  normal respiratory rate and pattern with no distress - normal breath sounds with no rales, rhonchi, wheezes or rubs  MS: no deformity or atrophy -  Skin: warm and dry, no rash  Neuro:  Alert and Oriented x 3, - CN II-Xii grossly intact Psych: euthymic mood, appropriate affect and demeanor   Assessment & Plan:    Prediabetes -     Hemoglobin A1c Watch diet  Acquired hypothyroidism Continue current med TSH pending Primary hypertension -     CBC with Differential/Platelet -     Comprehensive metabolic panel -     TSH Continue med Mixed hyperlipidemia -     Lipid panel Continue med and watch diet  -     Ambulatory referral to Dermatology  Depression, major, single episode, complete remission (HCC) Continue  meds   Breast cancer screening Mammogram scheduled  Chronic cough Rx for singulair 10mg  qd Chest xray ordered  Follow-up: Return in about 6 months (around 10/24/2023) for chronic fasting follow-up.  An After Visit Summary was printed and given to the patient.  Jettie Pagan [facility] [phone]

## 2023-04-24 ENCOUNTER — Encounter: Payer: Self-pay | Admitting: Physician Assistant

## 2023-04-24 ENCOUNTER — Ambulatory Visit (HOSPITAL_BASED_OUTPATIENT_CLINIC_OR_DEPARTMENT_OTHER)
Admission: RE | Admit: 2023-04-24 | Discharge: 2023-04-24 | Disposition: A | Discharge status: Home / Self Care | Payer: Medicare (Managed Care) | Source: Ambulatory Visit | Attending: Physician Assistant | Admitting: Physician Assistant

## 2023-04-24 ENCOUNTER — Other Ambulatory Visit: Payer: Self-pay | Admitting: Physician Assistant

## 2023-04-24 ENCOUNTER — Telehealth: Payer: Self-pay

## 2023-04-24 DIAGNOSIS — R7303 Prediabetes: Secondary | ICD-10-CM

## 2023-04-24 DIAGNOSIS — F325 Major depressive disorder, single episode, in full remission: Secondary | ICD-10-CM

## 2023-04-24 DIAGNOSIS — R053 Chronic cough: Secondary | ICD-10-CM

## 2023-04-24 DIAGNOSIS — R7301 Impaired fasting glucose: Secondary | ICD-10-CM

## 2023-04-24 DIAGNOSIS — L281 Prurigo nodularis: Secondary | ICD-10-CM

## 2023-04-24 DIAGNOSIS — I10 Essential (primary) hypertension: Secondary | ICD-10-CM

## 2023-04-24 DIAGNOSIS — E039 Hypothyroidism, unspecified: Secondary | ICD-10-CM

## 2023-04-24 DIAGNOSIS — E782 Mixed hyperlipidemia: Secondary | ICD-10-CM

## 2023-04-24 LAB — COMPREHENSIVE METABOLIC PANEL
ALT: 15 IU/L (ref 0–32)
AST: 15 IU/L (ref 0–40)
Albumin: 4.3 g/dL (ref 3.9–4.9)
Alkaline Phosphatase: 115 IU/L (ref 44–121)
BUN/Creatinine Ratio: 15 (ref 12–28)
BUN: 14 mg/dL (ref 8–27)
Bilirubin Total: 0.6 mg/dL (ref 0.0–1.2)
CO2: 25 mmol/L (ref 20–29)
Calcium: 9.6 mg/dL (ref 8.7–10.3)
Chloride: 100 mmol/L (ref 96–106)
Creatinine, Ser: 0.93 mg/dL (ref 0.57–1.00)
Globulin, Total: 2 g/dL (ref 1.5–4.5)
Glucose: 87 mg/dL (ref 70–99)
Potassium: 4 mmol/L (ref 3.5–5.2)
Sodium: 141 mmol/L (ref 134–144)
Total Protein: 6.3 g/dL (ref 6.0–8.5)
eGFR: 69 mL/min/{1.73_m2} (ref >59)

## 2023-04-24 LAB — HEMOGLOBIN A1C
Est. average glucose Bld gHb Est-mCnc: 111 mg/dL
Hgb A1c MFr Bld: 5.5 % (ref 4.8–5.6)

## 2023-04-24 LAB — LIPID PANEL
Chol/HDL Ratio: 2.8 ratio (ref 0.0–4.4)
Cholesterol, Total: 182 mg/dL (ref 100–199)
HDL: 65 mg/dL (ref >39)
LDL Chol Calc (NIH): 99 mg/dL (ref 0–99)
Triglycerides: 103 mg/dL (ref 0–149)
VLDL Cholesterol Cal: 18 mg/dL (ref 5–40)

## 2023-04-24 LAB — CBC WITH DIFFERENTIAL/PLATELET
Basophils Absolute: 0.1 10*3/uL (ref 0.0–0.2)
Basos: 2 %
EOS (ABSOLUTE): 0.1 10*3/uL (ref 0.0–0.4)
Eos: 2 %
Hematocrit: 35.5 % (ref 34.0–46.6)
Hemoglobin: 11.2 g/dL (ref 11.1–15.9)
Immature Grans (Abs): 0 10*3/uL (ref 0.0–0.1)
Immature Granulocytes: 1 %
Lymphocytes Absolute: 1.5 10*3/uL (ref 0.7–3.1)
Lymphs: 24 %
MCH: 28.1 pg (ref 26.6–33.0)
MCHC: 31.5 g/dL (ref 31.5–35.7)
MCV: 89 fL (ref 79–97)
Monocytes Absolute: 0.6 10*3/uL (ref 0.1–0.9)
Monocytes: 10 %
Neutrophils Absolute: 3.9 10*3/uL (ref 1.4–7.0)
Neutrophils: 61 %
Platelets: 292 10*3/uL (ref 150–450)
RBC: 3.99 x10E6/uL (ref 3.77–5.28)
RDW: 13.9 % (ref 11.7–15.4)
WBC: 6.2 10*3/uL (ref 3.4–10.8)

## 2023-04-24 LAB — TSH: TSH: 4.06 u[IU]/mL (ref 0.450–4.500)

## 2023-04-24 MED ORDER — BUPROPION HCL ER (XL) 150 MG PO TB24: 150.0000 mg | ORAL_TABLET | Freq: Every day | ORAL | 1 refills | Status: DC

## 2023-04-24 MED ORDER — METFORMIN HCL 500 MG PO TABS: 500.0000 mg | ORAL_TABLET | Freq: Two times a day (BID) | ORAL | 1 refills | Status: DC

## 2023-04-24 MED ORDER — AMLODIPINE-OLMESARTAN 5-20 MG PO TABS: 1.0000 | ORAL_TABLET | Freq: Every day | ORAL | 1 refills | Status: DC

## 2023-04-24 MED ORDER — ATORVASTATIN CALCIUM 40 MG PO TABS: 40.0000 mg | ORAL_TABLET | Freq: Every day | ORAL | 1 refills | Status: DC

## 2023-04-24 MED ORDER — MONTELUKAST SODIUM 10 MG PO TABS: 10.0000 mg | ORAL_TABLET | Freq: Every day | ORAL | 1 refills | Status: DC

## 2023-04-24 MED ORDER — VENLAFAXINE HCL ER 150 MG PO CP24
150.0000 mg | ORAL_CAPSULE | Freq: Every day | ORAL | 1 refills | Status: AC
Start: 2023-04-24 — End: ?

## 2023-04-24 MED ORDER — LEVOTHYROXINE SODIUM 50 MCG PO TABS: 50.0000 ug | ORAL_TABLET | Freq: Every day | ORAL | 1 refills | Status: DC

## 2023-04-24 NOTE — Telephone Encounter (Signed)
 Notify pt all [facility]s [facility]

## 2023-04-24 NOTE — Telephone Encounter (Signed)
 Copied from CRM [id number]. Topic: Clinical - Prescription Issue >> Apr 24, 2023  1:49 PM Clayton Bibles wrote: Reason for CRM: Danielle Jones calling because she needs ALL of her medications to be sent to [facility] to be refilled. [facility] requested the refills about 1 months ago. Danielle Jones's chart shows some of her medications have been sent to [facility] but [facility] have not received anything. Please send all medication refills to [facility]. Please send Danielle Jones a message through MyChart when completed. Thanks

## 2023-04-29 ENCOUNTER — Other Ambulatory Visit: Payer: Self-pay | Admitting: Physician Assistant

## 2023-04-29 DIAGNOSIS — I10 Essential (primary) hypertension: Secondary | ICD-10-CM

## 2023-04-30 ENCOUNTER — Ambulatory Visit (HOSPITAL_BASED_OUTPATIENT_CLINIC_OR_DEPARTMENT_OTHER)
Admission: RE | Admit: 2023-04-30 | Discharge: 2023-04-30 | Disposition: A | Discharge status: Home / Self Care | Payer: Medicare (Managed Care) | Source: Ambulatory Visit | Attending: Physician Assistant | Admitting: Physician Assistant

## 2023-04-30 ENCOUNTER — Encounter (HOSPITAL_BASED_OUTPATIENT_CLINIC_OR_DEPARTMENT_OTHER): Payer: Self-pay | Admitting: Radiology

## 2023-04-30 DIAGNOSIS — Z1231 Encounter for screening mammogram for malignant neoplasm of breast: Secondary | ICD-10-CM

## 2023-05-01 ENCOUNTER — Encounter: Payer: Self-pay | Admitting: Physician Assistant

## 2023-05-02 ENCOUNTER — Ambulatory Visit (INDEPENDENT_AMBULATORY_CARE_PROVIDER_SITE_OTHER): Payer: Medicare (Managed Care) | Admitting: Physician Assistant

## 2023-05-02 ENCOUNTER — Encounter: Payer: Self-pay | Admitting: Physician Assistant

## 2023-05-02 VITALS — BP 138/70 | HR 82 | Temp 97.9°F | Resp 16 | Ht 63.0 in | Wt 209.2 lb

## 2023-05-02 DIAGNOSIS — R053 Chronic cough: Secondary | ICD-10-CM

## 2023-05-02 MED ORDER — VOQUEZNA 10 MG PO TABS: 10.0000 mg | ORAL_TABLET | Freq: Every day | ORAL | Status: DC

## 2023-05-02 NOTE — Progress Notes (Signed)
 Acute Office Visit  Subjective:    Patient ID: Danielle Jones, female    DOB: 04/14/1960, 63 y.o.   MRN: [id number]  Chief Complaint  Patient presents with   Cough    HPI: Patient is in today for complaints of cough and feeling like she has to clear her throat constantly.  She states symptoms started about a month ago.  She has never had any associated fever, congestion, chest pain, dyspnea or wheezing.  She has not had productive cough.  States symptoms worse when laying down.  Says she feels like something stuck in her throat however she denies any trouble swallowing Has never had trouble with GERD in the past - perhaps has tried otc meds in past for that but not recently At last visit a week ago she was started on singulair which has not helped Had a chest xray which was normal   Current Outpatient Medications:    amLODipine-olmesartan (AZOR) 5-20 MG tablet, TAKE ONE TABLET BY MOUTH ONCE DAILY, Disp: 30 tablet, Rfl: 2   atorvastatin (LIPITOR) 40 MG tablet, Take 1 tablet (40 mg total) by mouth daily., Disp: 90 tablet, Rfl: 1   buPROPion (WELLBUTRIN XL) 150 MG 24 hr tablet, Take 1 tablet (150 mg total) by mouth daily., Disp: 90 tablet, Rfl: 1   Calcium-Vitamin D 600-5 MG-MCG TABS, Take 1 each by mouth daily., Disp: 90 tablet, Rfl: 1   cyanocobalamin (VITAMIN B12) 1000 MCG tablet, Take 1 tablet (1,000 mcg total) by mouth daily., Disp: 90 tablet, Rfl: 0   levothyroxine (SYNTHROID) 50 MCG tablet, Take 1 tablet (50 mcg total) by mouth daily., Disp: 90 tablet, Rfl: 1   metFORMIN (GLUCOPHAGE) 500 MG tablet, Take 1 tablet (500 mg total) by mouth 2 (two) times daily with a meal., Disp: 180 tablet, Rfl: 1   montelukast (SINGULAIR) 10 MG tablet, Take 1 tablet (10 mg total) by mouth at bedtime., Disp: 90 tablet, Rfl: 1   triamcinolone cream (KENALOG) 0.1 %, APPLY TO THE AFFECTED AREA(S) topically TWICE DAILY, Disp: 45 g, Rfl: 1   venlafaxine XR (EFFEXOR-XR) 150 MG 24 hr capsule, Take 1 capsule  (150 mg total) by mouth daily with breakfast., Disp: 90 capsule, Rfl: 1   Vonoprazan Fumarate (VOQUEZNA) 10 MG TABS, Take 10 mg by mouth daily., Disp: , Rfl:   No Known Allergies  ROS CONSTITUTIONAL: Negative for chills, fatigue, fever, E/N/T: Negative for ear pain, nasal congestion and sore throat.  CARDIOVASCULAR: Negative for chest pain, dizziness RESPIRATORY: see HPI GASTROINTESTINAL: Negative for abdominal pain, acid reflux symptoms, constipation, diarrhea, nausea and vomiting.       Objective:    PHYSICAL EXAM:   BP 138/70   Pulse 82   Temp 97.9 F (36.6 C) (Temporal)   Resp 16   Ht 5\' 3"  (1.6 m)   Wt 209 lb 3.2 oz (94.9 kg)   SpO2 99%   BMI 37.06 kg/m    GEN: Well nourished, well developed, in no acute distress  HEENT: normal external ears and nose - normal external auditory canals and TMS - - Lips, Teeth and Gums - normal  Oropharynx - normal mucosa, palate, and posterior pharynx Neck: no JVD or masses - no thyromegaly Cardiac: RRR; no murmurs, rubs, Respiratory:  normal respiratory rate and pattern with no distress - normal breath sounds with no rales, rhonchi, wheezes or rubs      Assessment & Plan:    Chronic cough Continue singuliar - start claritin qd Other orders - ?  GERD -     Voquezna; Take 10 mg by mouth daily.   Pt is taking Azor but has been on med for over 5 years without symptoms  Follow-up: Return if symptoms worsen or fail to improve.  An After Visit Summary was printed and given to the patient.  Jettie Pagan [facility] [phone]

## 2023-05-09 ENCOUNTER — Other Ambulatory Visit: Payer: Self-pay | Admitting: Family Medicine

## 2023-05-09 ENCOUNTER — Telehealth: Payer: Self-pay

## 2023-05-09 NOTE — Telephone Encounter (Signed)
 Copied from CRM [id number]. Topic: General - Other >> May 09, 2023 10:45 AM Emylou G wrote: Reason for CRM: Patient just wanted to relay to doctor.. the meds did work for her fine.. Can you prescribe for her.

## 2023-05-28 ENCOUNTER — Encounter (HOSPITAL_BASED_OUTPATIENT_CLINIC_OR_DEPARTMENT_OTHER): Payer: Medicare (Managed Care) | Admitting: Radiology

## 2023-05-28 ENCOUNTER — Ambulatory Visit (HOSPITAL_BASED_OUTPATIENT_CLINIC_OR_DEPARTMENT_OTHER): Payer: Medicare (Managed Care) | Admitting: Radiology

## 2023-09-20 ENCOUNTER — Ambulatory Visit (INDEPENDENT_AMBULATORY_CARE_PROVIDER_SITE_OTHER)
Admission: RE | Admit: 2023-09-20 | Discharge: 2023-09-20 | Disposition: A | Discharge status: Home / Self Care | Payer: Medicare (Managed Care) | Source: Ambulatory Visit | Attending: Physician Assistant | Admitting: Physician Assistant

## 2023-09-20 ENCOUNTER — Encounter: Payer: Self-pay | Admitting: Physician Assistant

## 2023-09-20 ENCOUNTER — Ambulatory Visit (INDEPENDENT_AMBULATORY_CARE_PROVIDER_SITE_OTHER): Payer: Medicare (Managed Care) | Admitting: Physician Assistant

## 2023-09-20 VITALS — BP 164/80 | HR 75 | Temp 97.2°F | Resp 16 | Ht 63.0 in | Wt 225.0 lb

## 2023-09-20 DIAGNOSIS — M542 Cervicalgia: Secondary | ICD-10-CM

## 2023-09-20 DIAGNOSIS — I10 Essential (primary) hypertension: Secondary | ICD-10-CM | POA: Diagnosis not present

## 2023-09-20 MED ORDER — AMLODIPINE-OLMESARTAN 5-40 MG PO TABS: 1.0000 | ORAL_TABLET | Freq: Every day | ORAL | 1 refills | Status: DC

## 2023-09-20 MED ORDER — CELECOXIB 200 MG PO CAPS: 200.0000 mg | ORAL_CAPSULE | Freq: Every day | ORAL | 1 refills | Status: DC

## 2023-09-20 MED ORDER — CYCLOBENZAPRINE HCL 5 MG PO TABS: 5.0000 mg | ORAL_TABLET | Freq: Every evening | ORAL | 0 refills | Status: DC | PRN

## 2023-09-20 NOTE — Progress Notes (Signed)
 Subjective:  Patient ID: Danielle Jones, female    DOB: 03/12/1960  Age: 63 y.o. MRN: [id number]  Chief Complaint  Patient presents with   Headache    HPI  Pt complains of headache and neck stiffness for the past 2 months - she states neck tender to touch and with movement.  She hears popping and cracking at times neck.  Denies any history of injury or trauma - she was a sewer for several years Takes ibuprofen and Goody powders with minimal relief  Pt with hypertension.  States bp at home has been elevating as well with readings 150s/90s  She states at night her heart beats 'hard' but has been stressed and anxious .  Denies chest pain or shortness of breath.  No edema.  Currently on azor  5/20mg  qd    04/23/2023    9:26 AM 12/11/2022   10:20 AM 10/23/2022    1:18 PM 07/06/2022   10:07 AM 03/27/2022   10:14 AM  Depression screen PHQ 2/9  Decreased Interest 2 1 1 2 3   Down, Depressed, Hopeless 2 1 1 1 3   PHQ - 2 Score 4 2 2 3 6   Altered sleeping 2 1 1 1 2   Tired, decreased energy 2 1 1 2 3   Change in appetite 0 0 0 1 3  Feeling bad or failure about yourself  2 0 0 1 3  Trouble concentrating 2 0 0 1 2  Moving slowly or fidgety/restless 0 0 0 0 1  Suicidal thoughts 0 0 0 0 0  PHQ-9 Score 12 4 4 9 20   Difficult doing work/chores Somewhat difficult  Not difficult at all Somewhat difficult Very difficult        04/01/2021    9:03 AM 07/06/2022   10:07 AM 10/23/2022    1:18 PM 12/11/2022   10:08 AM 04/23/2023    9:26 AM  Fall Risk  Falls in the past year? 0 0 0 0 0  Was there an injury with Fall? 0 0 0 0 0  Fall Risk Category Calculator 0 0 0 0 0  Fall Risk Category (Retired) Low       (RETIRED) Patient Fall Risk Level Low fall risk       Patient at Risk for Falls Due to  No Fall Risks No Fall Risks No Fall Risks No Fall Risks  Fall risk Follow up  Falls evaluation completed Falls evaluation completed Falls prevention discussed;Education provided;Falls evaluation completed Falls  evaluation completed     Data saved with a previous flowsheet row definition     ROS CONSTITUTIONAL:see HPI E/N/T: Negative for ear pain, nasal congestion and sore throat.  CARDIOVASCULAR:see HPI RESPIRATORY: Negative for recent cough and dyspnea.  GASTROINTESTINAL: Negative for abdominal pain, acid reflux symptoms, constipation, diarrhea, nausea and vomiting.  MSK: see HPI    Current Outpatient Medications:    amLODipine -olmesartan  (AZOR ) 5-40 MG tablet, Take 1 tablet by mouth daily., Disp: 30 tablet, Rfl: 1   atorvastatin  (LIPITOR) 40 MG tablet, Take 1 tablet (40 mg total) by mouth daily., Disp: 90 tablet, Rfl: 1   buPROPion  (WELLBUTRIN  XL) 150 MG 24 hr tablet, Take 1 tablet (150 mg total) by mouth daily., Disp: 90 tablet, Rfl: 1   Calcium -Vitamin D  600-5 MG-MCG TABS, Take 1 each by mouth daily., Disp: 90 tablet, Rfl: 1   celecoxib  (CELEBREX ) 200 MG capsule, Take 1 capsule (200 mg total) by mouth daily., Disp: 90 capsule, Rfl: 1   Cholecalciferol 125 MCG (  5000 UT) TABS, Take by oral route., Disp: , Rfl:    cyanocobalamin  (VITAMIN B12) 1000 MCG tablet, Take 1 tablet (1,000 mcg total) by mouth daily., Disp: 90 tablet, Rfl: 0   cyclobenzaprine  (FLEXERIL ) 5 MG tablet, Take 1 tablet (5 mg total) by mouth at bedtime as needed for muscle spasms., Disp: 30 tablet, Rfl: 0   levothyroxine  (SYNTHROID ) 50 MCG tablet, Take 1 tablet (50 mcg total) by mouth daily., Disp: 90 tablet, Rfl: 1   metFORMIN  (GLUCOPHAGE ) 500 MG tablet, Take 1 tablet (500 mg total) by mouth 2 (two) times daily with a meal., Disp: 180 tablet, Rfl: 1   triamcinolone  cream (KENALOG ) 0.1 %, APPLY TO THE AFFECTED AREA(S) topically TWICE DAILY, Disp: 45 g, Rfl: 1   venlafaxine  XR (EFFEXOR -XR) 150 MG 24 hr capsule, Take 1 capsule (150 mg total) by mouth daily with breakfast., Disp: 90 capsule, Rfl: 1  Past Medical History:  Diagnosis Date   Anemia    YOUNGER   Anxiety    Arthritis    HANDS   CMC arthritis    left thumb    Depression    Gout    High cholesterol    Hypertension    Hypothyroidism    Secondary hypertension 02/05/2020   Sleep apnea    Objective:  PHYSICAL EXAM:   BP (!) 164/80   Pulse 75   Temp (!) 97.2 F (36.2 C)   Resp 16   Ht 5' 3 (1.6 m)   Wt 225 lb (102.1 kg)   SpO2 97%   BMI 39.86 kg/m    GEN: Well nourished, well developed, in no acute distress   Cardiac: RRR; no murmurs,  Respiratory:  normal respiratory rate and pattern with no distress - normal breath sounds with no rales, rhonchi, wheezes or rubs  MS: neck tender to palpation - muscle spasm noted Skin: warm and dry, no rash   Psych: euthymic mood, appropriate affect and demeanor  EKG - no acute changes Assessment & Plan:    Primary hypertension -     EKG 12-Lead -     amLODIPine -Olmesartan ; Take 1 tablet by mouth daily.  Dispense: 30 tablet; Refill: 1 Pt has follow up appt in one month for chronic visit/fasting labs  Neck pain -     DG Cervical Spine Complete; Future -     Celecoxib ; Take 1 capsule (200 mg total) by mouth daily.  Dispense: 90 capsule; Refill: 1 -     Cyclobenzaprine  HCl; Take 1 tablet (5 mg total) by mouth at bedtime as needed for muscle spasms.  Dispense: 30 tablet; Refill: 0     Follow-up: Return for as scheduled for chronic visit.  An After Visit Summary was printed and given to the patient.  CAMIE JONELLE NICHOLAUS DEVONNA [facility] [phone]

## 2023-10-03 ENCOUNTER — Ambulatory Visit: Payer: Self-pay | Admitting: Physician Assistant

## 2023-10-12 ENCOUNTER — Other Ambulatory Visit: Payer: Self-pay | Admitting: Physician Assistant

## 2023-10-12 DIAGNOSIS — E782 Mixed hyperlipidemia: Secondary | ICD-10-CM

## 2023-10-12 DIAGNOSIS — R7301 Impaired fasting glucose: Secondary | ICD-10-CM

## 2023-10-12 DIAGNOSIS — F325 Major depressive disorder, single episode, in full remission: Secondary | ICD-10-CM

## 2023-10-12 DIAGNOSIS — R053 Chronic cough: Secondary | ICD-10-CM

## 2023-10-12 DIAGNOSIS — R7303 Prediabetes: Secondary | ICD-10-CM

## 2023-10-24 ENCOUNTER — Encounter: Payer: Self-pay | Admitting: Physician Assistant

## 2023-10-24 ENCOUNTER — Ambulatory Visit (INDEPENDENT_AMBULATORY_CARE_PROVIDER_SITE_OTHER): Payer: Medicare (Managed Care) | Admitting: Physician Assistant

## 2023-10-24 VITALS — BP 150/76 | HR 74 | Temp 97.8°F | Resp 18 | Ht 63.0 in | Wt 226.6 lb

## 2023-10-24 DIAGNOSIS — F321 Major depressive disorder, single episode, moderate: Secondary | ICD-10-CM

## 2023-10-24 DIAGNOSIS — R7989 Other specified abnormal findings of blood chemistry: Secondary | ICD-10-CM

## 2023-10-24 DIAGNOSIS — I10 Essential (primary) hypertension: Secondary | ICD-10-CM | POA: Diagnosis not present

## 2023-10-24 DIAGNOSIS — R7303 Prediabetes: Secondary | ICD-10-CM

## 2023-10-24 DIAGNOSIS — E782 Mixed hyperlipidemia: Secondary | ICD-10-CM

## 2023-10-24 DIAGNOSIS — N3941 Urge incontinence: Secondary | ICD-10-CM

## 2023-10-24 DIAGNOSIS — Z23 Encounter for immunization: Secondary | ICD-10-CM | POA: Diagnosis not present

## 2023-10-24 DIAGNOSIS — M542 Cervicalgia: Secondary | ICD-10-CM

## 2023-10-24 DIAGNOSIS — E039 Hypothyroidism, unspecified: Secondary | ICD-10-CM

## 2023-10-24 LAB — POCT URINALYSIS DIP (CLINITEK)
Bilirubin, UA: NEGATIVE
Blood, UA: NEGATIVE
Glucose, UA: NEGATIVE mg/dL
Ketones, POC UA: NEGATIVE mg/dL
Leukocytes, UA: NEGATIVE
Nitrite, UA: NEGATIVE
POC PROTEIN,UA: NEGATIVE
Spec Grav, UA: 1.01 (ref 1.010–1.025)
Urobilinogen, UA: 0.2 U/dL
pH, UA: 6 (ref 5.0–8.0)

## 2023-10-24 MED ORDER — AMLODIPINE-OLMESARTAN 10-40 MG PO TABS
1.0000 | ORAL_TABLET | Freq: Every day | ORAL | 1 refills | Status: DC
Start: 2023-10-24 — End: 2023-12-19

## 2023-10-24 MED ORDER — TOLTERODINE TARTRATE ER 2 MG PO CP24: 2.0000 mg | ORAL_CAPSULE | Freq: Every day | ORAL | 1 refills | Status: DC

## 2023-10-24 MED ORDER — AMLODIPINE-OLMESARTAN 10-40 MG PO TABS: 1.0000 | ORAL_TABLET | Freq: Every day | ORAL | 1 refills | Status: DC

## 2023-10-24 MED ORDER — CYCLOBENZAPRINE HCL 5 MG PO TABS: 5.0000 mg | ORAL_TABLET | Freq: Every evening | ORAL | 2 refills | Status: AC | PRN

## 2023-10-24 NOTE — Progress Notes (Signed)
 Subjective:  Patient ID: Danielle Jones, female    DOB: 13-Jul-1960  Age: 63 y.o. MRN: [id number]  Chief Complaint  Patient presents with   Medical Management of Chronic Issues    HPI Mixed hyperlipidemia  Pt presents with hyperlipidemia. Compliance with treatment has been good The patient is compliant with medications, maintains a low cholesterol diet , follows up as directed , and maintains an exercise regimen . The patient denies experiencing any hypercholesterolemia related symptoms. Taking lipitor 40mg  qd  Pt with hypothyroidism - taking synthroid  50mcg qd - due for labwork  Pt with prediabetes - taking glucophage  500mg  and voices no concerns or problems - not having any significantly elevated glucose readings  Pt with anxiety - stable on effexor  and wellbutrin  - she scores high on screening questions but states it all has to do with her current living situation and will improve - feels she is good on current medications  Pt presents for follow up of hypertension. The patient is tolerating the medication well without side effects. Compliance with treatment has been good; including taking medication as directed , maintains a healthy diet and regular exercise regimen , and following up as directed. Currently taking azor  5/40mg  and bp readings at home have still been slightly elevated Denies chest pain or shortness of breath  Pt complains of stress/urge incontinence for the past few months Denies frequency, urgency or dysuria     10/24/2023    9:15 AM 04/23/2023    9:26 AM 12/11/2022   10:20 AM 10/23/2022    1:18 PM 07/06/2022   10:07 AM  Depression screen PHQ 2/9  Decreased Interest 3 2 1 1 2   Down, Depressed, Hopeless 1 2 1 1 1   PHQ - 2 Score 4 4 2 2 3   Altered sleeping 2 2 1 1 1   Tired, decreased energy 3 2 1 1 2   Change in appetite 2 0 0 0 1  Feeling bad or failure about yourself  1 2 0 0 1  Trouble concentrating 1 2 0 0 1  Moving slowly or fidgety/restless 1 0 0 0 0   Suicidal thoughts 0 0 0 0 0  PHQ-9 Score 14 12 4 4 9   Difficult doing work/chores Somewhat difficult Somewhat difficult  Not difficult at all Somewhat difficult        07/06/2022   10:07 AM 10/23/2022    1:18 PM 12/11/2022   10:08 AM 04/23/2023    9:26 AM 10/24/2023    9:15 AM  Fall Risk  Falls in the past year? 0 0 0 0 0  Was there an injury with Fall? 0 0 0 0 0  Fall Risk Category Calculator 0 0 0 0 0  Patient at Risk for Falls Due to No Fall Risks No Fall Risks No Fall Risks No Fall Risks No Fall Risks  Fall risk Follow up Falls evaluation completed Falls evaluation completed Falls prevention discussed;Education provided;Falls evaluation completed Falls evaluation completed Falls evaluation completed     CONSTITUTIONAL: Negative for chills, fatigue, fever, unintentional weight gain and unintentional weight loss.  E/N/T: Negative for ear pain, nasal congestion and sore throat.  CARDIOVASCULAR: Negative for chest pain, dizziness, palpitations and pedal edema.  RESPIRATORY: Negative for recent cough and dyspnea.  GASTROINTESTINAL: Negative for abdominal pain, acid reflux symptoms, constipation, diarrhea, nausea and vomiting.  GU - see HPI MSK: Negative for arthralgias and myalgias.  INTEGUMENTARY: Negative for rash.  NEUROLOGICAL: Negative for dizziness and headaches.  PSYCHIATRIC: Negative for  sleep disturbance and to question depression screen.  Negative for depression, negative for anhedonia.       Current Outpatient Medications:    atorvastatin  (LIPITOR) 40 MG tablet, TAKE 1 TABLET DAILY, Disp: 90 tablet, Rfl: 1   buPROPion  (WELLBUTRIN  XL) 150 MG 24 hr tablet, TAKE 1 TABLET DAILY, Disp: 90 tablet, Rfl: 1   Calcium -Vitamin D  600-5 MG-MCG TABS, Take 1 each by mouth daily., Disp: 90 tablet, Rfl: 1   celecoxib  (CELEBREX ) 200 MG capsule, Take 1 capsule (200 mg total) by mouth daily., Disp: 90 capsule, Rfl: 1   Cholecalciferol 125 MCG (5000 UT) TABS, Take by oral route., Disp: , Rfl:     cyanocobalamin  (VITAMIN B12) 1000 MCG tablet, Take 1 tablet (1,000 mcg total) by mouth daily., Disp: 90 tablet, Rfl: 0   levothyroxine  (SYNTHROID ) 50 MCG tablet, Take 1 tablet (50 mcg total) by mouth daily., Disp: 90 tablet, Rfl: 1   metFORMIN  (GLUCOPHAGE ) 500 MG tablet, TAKE 1 TABLET TWICE A DAY WITH MEALS, Disp: 180 tablet, Rfl: 1   tolterodine  (DETROL  LA) 2 MG 24 hr capsule, Take 1 capsule (2 mg total) by mouth daily., Disp: 30 capsule, Rfl: 1   triamcinolone  cream (KENALOG ) 0.1 %, APPLY TO THE AFFECTED AREA(S) topically TWICE DAILY, Disp: 45 g, Rfl: 1   venlafaxine  XR (EFFEXOR -XR) 150 MG 24 hr capsule, Take 1 capsule (150 mg total) by mouth daily with breakfast., Disp: 90 capsule, Rfl: 1   amLODipine -olmesartan  (AZOR ) 10-40 MG tablet, Take 1 tablet by mouth daily., Disp: 30 tablet, Rfl: 1   cyclobenzaprine  (FLEXERIL ) 5 MG tablet, Take 1 tablet (5 mg total) by mouth at bedtime as needed for muscle spasms., Disp: 30 tablet, Rfl: 2  Past Medical History:  Diagnosis Date   Anemia    YOUNGER   Anxiety    Arthritis    HANDS   CMC arthritis    left thumb   Depression    Gout    High cholesterol    Hypertension    Hypothyroidism    Secondary hypertension 02/05/2020   Sleep apnea    Objective:  PHYSICAL EXAM:   VS: BP (!) 150/76   Pulse 74   Temp 97.8 F (36.6 C) (Temporal)   Resp 18   Ht 5' 3 (1.6 m)   Wt 226 lb 9.6 oz (102.8 kg)   SpO2 95%   BMI 40.14 kg/m   GEN: Well nourished, well developed, in no acute distress  Cardiac: RRR; no murmurs, rubs, or gallops,no edema -  Respiratory:  normal respiratory rate and pattern with no distress - normal breath sounds with no rales, rhonchi, wheezes or rubs  MS: no deformity or atrophy  Skin: warm and dry, no rash  Neuro:  Alert and Oriented x 3, - CN II-Xii grossly intact Psych: euthymic mood, appropriate affect and demeanor  Office Visit on 10/24/2023  Component Date Value Ref Range Status   Color, UA 10/24/2023 yellow   yellow Final   Clarity, UA 10/24/2023 clear  clear Final   Glucose, UA 10/24/2023 negative  negative mg/dL Final   Bilirubin, UA 90/75/7974 negative  negative Final   Ketones, POC UA 10/24/2023 negative  negative mg/dL Final   Spec Grav, UA 90/75/7974 1.010  1.010 - 1.025 Final   Blood, UA 10/24/2023 negative  negative Final   pH, UA 10/24/2023 6.0  5.0 - 8.0 Final   POC PROTEIN,UA 10/24/2023 negative  negative, trace Final   Urobilinogen, UA 10/24/2023 0.2  0.2 or 1.0  E.U./dL Final   Nitrite, UA 90/75/7974 Negative  Negative Final   Leukocytes, UA 10/24/2023 Negative  Negative Final     Assessment & Plan:    Prediabetes -     Hemoglobin A1c Watch diet  Acquired hypothyroidism Continue current med TSH pending Primary hypertension -     CBC with Differential/Platelet -     Comprehensive metabolic panel -     TSH Increase azor  to 10/40mg  qd Mixed hyperlipidemia -     Lipid panel Continue med and watch diet  -     Ambulatory referral to Dermatology  Depression, major, single episode, moderate (HCC) Continue meds  Vit D def Labwork pending  Neck pain Use flexeril  as needed  Urge incontinence Ua ordered  Need flu shot Flublok given  Need pneumonia shot Pneumo 20 given  Follow-up: Return in about 6 months (around 04/22/2024) for chronic fasting follow-up - 4 weeks nurse visit bp check.  An After Visit Summary was printed and given to the patient.  Danielle Jones [facility] [phone]

## 2023-10-25 LAB — CBC WITH DIFFERENTIAL/PLATELET
Basophils Absolute: 0.1 x10E3/uL (ref 0.0–0.2)
Basos: 1 %
EOS (ABSOLUTE): 0.3 x10E3/uL (ref 0.0–0.4)
Eos: 4 %
Hematocrit: 41.5 % (ref 34.0–46.6)
Hemoglobin: 12.6 g/dL (ref 11.1–15.9)
Immature Grans (Abs): 0 x10E3/uL (ref 0.0–0.1)
Immature Granulocytes: 0 %
Lymphocytes Absolute: 1.6 x10E3/uL (ref 0.7–3.1)
Lymphs: 25 %
MCH: 29 pg (ref 26.6–33.0)
MCHC: 30.4 g/dL — ABNORMAL LOW (ref 31.5–35.7)
MCV: 95 fL (ref 79–97)
Monocytes Absolute: 0.6 x10E3/uL (ref 0.1–0.9)
Monocytes: 9 %
Neutrophils Absolute: 3.8 x10E3/uL (ref 1.4–7.0)
Neutrophils: 60 %
Platelets: 281 x10E3/uL (ref 150–450)
RBC: 4.35 x10E6/uL (ref 3.77–5.28)
RDW: 13.6 % (ref 11.7–15.4)
WBC: 6.3 x10E3/uL (ref 3.4–10.8)

## 2023-10-25 LAB — COMPREHENSIVE METABOLIC PANEL WITH GFR
ALT: 12 IU/L (ref 0–32)
AST: 14 IU/L (ref 0–40)
Albumin: 4.4 g/dL (ref 3.9–4.9)
Alkaline Phosphatase: 132 IU/L (ref 49–135)
BUN/Creatinine Ratio: 15 (ref 12–28)
BUN: 14 mg/dL (ref 8–27)
Bilirubin Total: 0.6 mg/dL (ref 0.0–1.2)
CO2: 25 mmol/L (ref 20–29)
Calcium: 9.6 mg/dL (ref 8.7–10.3)
Chloride: 98 mmol/L (ref 96–106)
Creatinine, Ser: 0.94 mg/dL (ref 0.57–1.00)
Globulin, Total: 2.2 g/dL (ref 1.5–4.5)
Glucose: 100 mg/dL — ABNORMAL HIGH (ref 70–99)
Potassium: 3.9 mmol/L (ref 3.5–5.2)
Sodium: 140 mmol/L (ref 134–144)
Total Protein: 6.6 g/dL (ref 6.0–8.5)
eGFR: 68 mL/min/1.73 (ref >59)

## 2023-10-25 LAB — LIPID PANEL
Chol/HDL Ratio: 3.5 ratio (ref 0.0–4.4)
Cholesterol, Total: 194 mg/dL (ref 100–199)
HDL: 55 mg/dL (ref >39)
LDL Chol Calc (NIH): 110 mg/dL — ABNORMAL HIGH (ref 0–99)
Triglycerides: 165 mg/dL — ABNORMAL HIGH (ref 0–149)
VLDL Cholesterol Cal: 29 mg/dL (ref 5–40)

## 2023-10-25 LAB — HEMOGLOBIN A1C
Est. average glucose Bld gHb Est-mCnc: 114 mg/dL
Hgb A1c MFr Bld: 5.6 % (ref 4.8–5.6)

## 2023-10-25 LAB — TSH: TSH: 1.04 u[IU]/mL (ref 0.450–4.500)

## 2023-10-25 LAB — VITAMIN D 25 HYDROXY (VIT D DEFICIENCY, FRACTURES): Vit D, 25-Hydroxy: 42.5 ng/mL (ref 30.0–100.0)

## 2023-10-28 ENCOUNTER — Ambulatory Visit: Payer: Self-pay | Admitting: Family Medicine

## 2023-11-06 ENCOUNTER — Other Ambulatory Visit: Payer: Self-pay | Admitting: Physician Assistant

## 2023-11-06 DIAGNOSIS — E039 Hypothyroidism, unspecified: Secondary | ICD-10-CM

## 2023-12-19 ENCOUNTER — Other Ambulatory Visit: Payer: Self-pay | Admitting: Physician Assistant

## 2023-12-19 DIAGNOSIS — N3941 Urge incontinence: Secondary | ICD-10-CM

## 2023-12-19 DIAGNOSIS — I10 Essential (primary) hypertension: Secondary | ICD-10-CM

## 2024-01-10 ENCOUNTER — Other Ambulatory Visit: Payer: Self-pay | Admitting: Physician Assistant

## 2024-01-10 DIAGNOSIS — F325 Major depressive disorder, single episode, in full remission: Secondary | ICD-10-CM

## 2024-01-10 DIAGNOSIS — I10 Essential (primary) hypertension: Secondary | ICD-10-CM

## 2024-01-13 ENCOUNTER — Other Ambulatory Visit: Payer: Self-pay | Admitting: Physician Assistant

## 2024-01-13 DIAGNOSIS — M542 Cervicalgia: Secondary | ICD-10-CM

## 2024-01-22 ENCOUNTER — Ambulatory Visit: Payer: Self-pay

## 2024-01-22 ENCOUNTER — Encounter: Payer: Self-pay | Admitting: Family Medicine

## 2024-01-22 ENCOUNTER — Ambulatory Visit: Payer: Medicare (Managed Care) | Admitting: Family Medicine

## 2024-01-22 VITALS — BP 166/88 | HR 90 | Temp 98.7°F | Resp 18 | Ht 63.0 in | Wt 229.4 lb

## 2024-01-22 DIAGNOSIS — I10 Essential (primary) hypertension: Secondary | ICD-10-CM | POA: Diagnosis not present

## 2024-01-22 DIAGNOSIS — J069 Acute upper respiratory infection, unspecified: Secondary | ICD-10-CM

## 2024-01-22 DIAGNOSIS — R6889 Other general symptoms and signs: Secondary | ICD-10-CM

## 2024-01-22 DIAGNOSIS — F321 Major depressive disorder, single episode, moderate: Secondary | ICD-10-CM

## 2024-01-22 DIAGNOSIS — R051 Acute cough: Secondary | ICD-10-CM | POA: Diagnosis not present

## 2024-01-22 DIAGNOSIS — H66002 Acute suppurative otitis media without spontaneous rupture of ear drum, left ear: Secondary | ICD-10-CM

## 2024-01-22 LAB — POC INFLUENZA A&B (BINAX/QUICKVUE)
Influenza A, POC: NEGATIVE
Influenza B, POC: NEGATIVE

## 2024-01-22 LAB — POC COVID19 BINAXNOW: SARS Coronavirus 2 Ag: NEGATIVE

## 2024-01-22 MED ORDER — PREDNISONE 20 MG PO TABS: 20.0000 mg | ORAL_TABLET | Freq: Every day | ORAL | 0 refills | Status: AC

## 2024-01-22 MED ORDER — GUAIFENESIN-CODEINE 100-10 MG/5ML PO SOLN: 5.0000 mL | Freq: Three times a day (TID) | ORAL | 0 refills | Status: AC | PRN

## 2024-01-22 MED ORDER — HYDROCHLOROTHIAZIDE 12.5 MG PO TABS: 12.5000 mg | ORAL_TABLET | Freq: Every day | ORAL | 3 refills | Status: DC

## 2024-01-22 MED ORDER — AMOXICILLIN-POT CLAVULANATE 875-125 MG PO TABS: 1.0000 | ORAL_TABLET | Freq: Two times a day (BID) | ORAL | 0 refills | Status: AC

## 2024-01-22 NOTE — Progress Notes (Signed)
 k  Acute Office Visit  Subjective:    Patient ID: Danielle Jones, female    DOB: Jan 19, 1961, 63 y.o.   MRN: [id number]  Chief Complaint  Patient presents with   Cough   Fever   Discussed the use of AI scribe software for clinical note transcription with the patient, who gave verbal consent to proceed.  History of Present Illness   Danielle Jones is a 63 year old female with hypertension who presents with ear pain and cough.  Otic symptoms - Abrupt onset of significant ear pain four days ago after attending a large event - Ear pain worsens with swallowing - Sensation of ears feeling 'almost red'  Respiratory symptoms - Severe cough disrupting sleep - Chest pain associated with coughing, attributed to sore ribs - No history of asthma  Constitutional symptoms - Chills and slight fever, maximum temperature 99.41F - No sensation of feeling hot most of the time - Slight headache, not severe - No nausea, vomiting, diarrhea, or constipation - No vision changes  Sleep disturbance - Cough severe enough to disrupt sleep - Use of Vicks PM for sleep, ineffective last night  Appetite and hydration - Decreased appetite over the past few days - Maintaining adequate fluid intake  Hypertension - History of hypertension - Currently taking olmesartan  and amlodipine  - Recent elevation in blood pressure, attributed to stress - Hydrochlorothiazide  discontinued after weight loss improved blood pressure control - No history of diabetes  Mood and psychiatric symptoms - Currently taking Effexor  and Wellbutrin  for depression and anxiety, finds ineffective - Increased irritability recently      Past Medical History:  Diagnosis Date   Anemia    YOUNGER   Anxiety    Arthritis    HANDS   [facility] arthritis    left thumb   Depression    Gout    High cholesterol    Hypertension    Hypothyroidism    Secondary hypertension 02/05/2020   Sleep apnea     Past Surgical History:   Procedure Laterality Date   ABDOMINAL HYSTERECTOMY     2007   Carpal tunnel  2019   CARPOMETACARPEL SUSPENSION PLASTY Left 05/12/2020   Procedure: LEFT THUMB CARPOMETACARPAL ARTHROPLASTY;  Surgeon: Jerri Kay HERO, MD;  Location: [facility];  Service: Orthopedics;  Laterality: Left;   thumb release  2019    Family History  Problem Relation Age of Onset   Arthritis Mother    Alzheimer's disease Mother    Kidney failure Mother    Congenital heart disease Father    Melanoma Father    Healthy Son    Healthy Son    Blindness Son    Mental illness Son    Colon cancer Neg Hx    Colon polyps Neg Hx    Crohn's disease Neg Hx    Esophageal cancer Neg Hx    Stomach cancer Neg Hx    Rectal cancer Neg Hx    Ulcerative colitis Neg Hx    Breast cancer Neg Hx     Social History   Socioeconomic History   Marital status: Single    Spouse name: Not on file   Number of children: Not on file   Years of education: Not on file   Highest education level: Associate degree: occupational, scientist, product/process development, or vocational program  Occupational History   Not on file  Tobacco Use   Smoking status: Never    Passive exposure: Past   Smokeless tobacco: Never  Vaping Use   Vaping status: Never Used  Substance and Sexual Activity   Alcohol use: Yes    Comment: social   Drug use: No   Sexual activity: Not on file  Other Topics Concern   Not on file  Social History Narrative   Not on file   Social Drivers of Health   Tobacco Use: Low Risk (01/22/2024)   Patient History    Smoking Tobacco Use: Never    Smokeless Tobacco Use: Never    Passive Exposure: Past  Financial Resource Strain: Low Risk (12/20/2022)   Overall Financial Resource Strain (CARDIA)    Difficulty of Paying Living Expenses: Not very hard  Food Insecurity: No Food Insecurity (12/20/2022)   Hunger Vital Sign    Worried About Running Out of Food in the Last Year: Never true    Ran Out of Food in the Last Year: Never  true  Transportation Needs: No Transportation Needs (12/20/2022)   PRAPARE - Administrator, Civil Service (Medical): No    Lack of Transportation (Non-Medical): No  Physical Activity: Inactive (12/20/2022)   Exercise Vital Sign    Days of Exercise per Week: 0 days    Minutes of Exercise per Session: 0 min  Stress: No Stress Concern Present (12/20/2022)   [employer] of Occupational Health - Occupational Stress Questionnaire    Feeling of Stress : Only a little  Recent Concern: Stress - Stress Concern Present (10/23/2022)   [employer] of Occupational Health - Occupational Stress Questionnaire    Feeling of Stress : To some extent  Social Connections: Moderately Isolated (12/20/2022)   Social Connection and Isolation Panel    Frequency of Communication with Friends and Family: Twice a week    Frequency of Social Gatherings with Friends and Family: Twice a week    Attends Religious Services: Never    Database Administrator or Organizations: No    Attends Banker Meetings: Never    Marital Status: Married  Catering Manager Violence: Not At Risk (12/11/2022)   Humiliation, Afraid, Rape, and Kick questionnaire    Fear of Current or Ex-Partner: No    Emotionally Abused: No    Physically Abused: No    Sexually Abused: No  Depression (PHQ2-9): High Risk (10/24/2023)   Depression (PHQ2-9)    PHQ-2 Score: 14  Alcohol Screen: Low Risk (12/20/2022)   Alcohol Screen    Last Alcohol Screening Score (AUDIT): 1  Housing: Low Risk (12/20/2022)   Housing    Last Housing Risk Score: 0  Utilities: Not At Risk (12/11/2022)   AHC Utilities    Threatened with loss of utilities: No  Health Literacy: Adequate Health Literacy (12/11/2022)   B1300 Health Literacy    Frequency of need for help with medical instructions: Never    Outpatient Medications Prior to Visit  Medication Sig Dispense Refill   amLODipine -olmesartan  (AZOR ) 10-40 MG tablet TAKE ONE TABLET BY  MOUTH ONCE DAILY 30 tablet 3   atorvastatin  (LIPITOR) 40 MG tablet TAKE 1 TABLET DAILY 90 tablet 1   buPROPion  (WELLBUTRIN  XL) 150 MG 24 hr tablet TAKE 1 TABLET DAILY 90 tablet 1   Calcium -Vitamin D  600-5 MG-MCG TABS Take 1 each by mouth daily. 90 tablet 1   celecoxib  (CELEBREX ) 200 MG capsule Take 1 capsule (200 mg total) by mouth daily. 90 capsule 1   Cholecalciferol 125 MCG (5000 UT) TABS Take by oral route.     cyanocobalamin  (VITAMIN B12) 1000 MCG tablet  Take 1 tablet (1,000 mcg total) by mouth daily. 90 tablet 0   cyclobenzaprine  (FLEXERIL ) 5 MG tablet Take 1 tablet (5 mg total) by mouth at bedtime as needed for muscle spasms. 30 tablet 2   levothyroxine  (SYNTHROID ) 50 MCG tablet TAKE 1 TABLET DAILY 90 tablet 1   metFORMIN  (GLUCOPHAGE ) 500 MG tablet TAKE 1 TABLET TWICE A DAY WITH MEALS 180 tablet 1   tolterodine  (DETROL  LA) 2 MG 24 hr capsule TAKE 1 CAPSULE BY MOUTH ONCE DAILY 30 capsule 3   triamcinolone  cream (KENALOG ) 0.1 % APPLY TO THE AFFECTED AREA(S) topically TWICE DAILY 45 g 1   venlafaxine  XR (EFFEXOR -XR) 150 MG 24 hr capsule TAKE 1 CAPSULE DAILY WITH BREAKFAST 90 capsule 1   No facility-administered medications prior to visit.    Allergies[1]  Review of Systems  Constitutional:  Positive for fatigue and fever.  HENT:  Positive for congestion, ear pain, postnasal drip, rhinorrhea and sore throat. Negative for sneezing.   Eyes: Negative.  Negative for visual disturbance.  Respiratory:  Positive for cough and shortness of breath. Negative for wheezing.   Cardiovascular:  Positive for chest pain (ribs sore from coughing).  Gastrointestinal:  Negative for constipation, diarrhea, nausea and vomiting.  Endocrine: Negative.   Genitourinary:  Negative for dysuria, frequency and urgency.  Musculoskeletal:  Positive for back pain (when coughing).  Allergic/Immunologic: Negative.   Neurological:  Positive for headaches. Negative for dizziness and light-headedness.  Hematological:  Negative.        Objective:        01/22/2024    2:35 PM 10/24/2023    9:13 AM 10/24/2023    9:06 AM  Vitals with BMI  Height 5' 3  5' 3  Weight 229 lbs 6 oz  226 lbs 10 oz  BMI 40.65  40.15  Systolic 166 150 849  Diastolic 88 76 82  Pulse 90  74    No data found.   Physical Exam Vitals reviewed.  Constitutional:      General: She is not in acute distress.    Appearance: Normal appearance. She is obese. She is ill-appearing.  HENT:     Right Ear: Tympanic membrane is not erythematous.     Left Ear: Tympanic membrane is erythematous.  Eyes:     Conjunctiva/sclera: Conjunctivae normal.  Cardiovascular:     Rate and Rhythm: Normal rate and regular rhythm.     Heart sounds: Normal heart sounds. No murmur heard. Pulmonary:     Effort: Pulmonary effort is normal. No respiratory distress.     Breath sounds: Normal breath sounds.  Abdominal:     Palpations: Abdomen is soft.  Musculoskeletal:        General: Normal range of motion.     Right lower leg: 1+ Pitting Edema present.     Left lower leg: 1+ Pitting Edema present.  Skin:    General: Skin is warm.  Neurological:     Mental Status: She is alert and oriented to person, place, and time. Mental status is at baseline.  Psychiatric:        Mood and Affect: Mood normal.        Behavior: Behavior normal.     Health Maintenance Due  Topic Date Due   Medicare Annual Wellness (AWV)  12/11/2023    There are no preventive care reminders to display for this patient.   Lab Results  Component Value Date   TSH 1.040 10/24/2023   Lab Results  Component Value  Date   WBC 6.3 10/24/2023   HGB 12.6 10/24/2023   HCT 41.5 10/24/2023   MCV 95 10/24/2023   PLT 281 10/24/2023   Lab Results  Component Value Date   NA 140 10/24/2023   K 3.9 10/24/2023   CO2 25 10/24/2023   GLUCOSE 100 (H) 10/24/2023   BUN 14 10/24/2023   CREATININE 0.94 10/24/2023   BILITOT 0.6 10/24/2023   ALKPHOS 132 10/24/2023   AST 14  10/24/2023   ALT 12 10/24/2023   PROT 6.6 10/24/2023   ALBUMIN 4.4 10/24/2023   CALCIUM  9.6 10/24/2023   ANIONGAP 8 05/10/2020   EGFR 68 10/24/2023   Lab Results  Component Value Date   CHOL 194 10/24/2023   Lab Results  Component Value Date   HDL 55 10/24/2023   Lab Results  Component Value Date   LDLCALC 110 (H) 10/24/2023   Lab Results  Component Value Date   TRIG 165 (H) 10/24/2023   Lab Results  Component Value Date   CHOLHDL 3.5 10/24/2023   Lab Results  Component Value Date   HGBA1C 5.6 10/24/2023        Results for orders placed or performed in visit on 01/22/24  POC COVID-19   Collection Time: 01/22/24  3:15 PM  Result Value Ref Range   SARS Coronavirus 2 Ag Negative Negative  POC Influenza A&B (Binax test)   Collection Time: 01/22/24  3:15 PM  Result Value Ref Range   Influenza A, POC Negative Negative   Influenza B, POC Negative Negative     Assessment & Plan:   Assessment & Plan Upper respiratory tract infection, unspecified type Acute upper respiratory infection with right acute otitis media Ear infection with persistent cough and throat redness. Negative for flu and COVID. - Prescribed prednisone  for chest inflammation and cough. - Advised mask use due to fever and contagiousness. Orders:   predniSONE  (DELTASONE ) 20 MG tablet; Take 1 tablet (20 mg total) by mouth daily with breakfast for 5 days.  Non-recurrent acute suppurative otitis media of left ear without spontaneous rupture of tympanic membrane Right acute otitis media, erythema of the right TM Ear infection with persistent cough and throat redness. Negative for flu and COVID. - Prescribed Augmentin  twice daily for 7 days. Orders:   amoxicillin -clavulanate (AUGMENTIN ) 875-125 MG tablet; Take 1 tablet by mouth 2 (two) times daily for 7 days.  Primary hypertension Primary hypertension with lower extremity edema Elevated blood pressure and edema noted. Previous discontinuation of  hydrochlorothiazide  may have contributed to current symptoms. BP Readings from Last 3 Encounters:  01/22/24 (!) 166/88  10/24/23 (!) 150/76  09/20/23 (!) 164/80  - Prescribed hydrochlorothiazide  12.5 mg in the morning. - Advised follow-up in two weeks to monitor blood pressure. Orders:   hydrochlorothiazide  (HYDRODIURIL ) 12.5 MG tablet; Take 1 tablet (12.5 mg total) by mouth daily.  Flu-like symptoms Flu A & B - negative Covid-19 - negative - Continue to take Tylenol  or Motrin for fever  Orders:   POC COVID-19   POC Influenza A&B (Binax test)  Acute cough Acute cough that keeps her up at night. - Prescribed cough medicine with codeine  for sleep. - Advised mask use due to fever. Orders:   guaiFENesin -codeine  100-10 MG/5ML syrup; Take 5 mLs by mouth 3 (three) times daily as needed.  Depression, major, single episode, moderate (HCC) Depression Current Wellbutrin  and Effexor  regimen ineffective. Discussed prednisone  side effects and gene site testing for medication compatibility. - Advised to wean off Wellbutrin  before starting  new medication. - Discussed gene site testing for medication compatibility. - Consider new medication post-Wellbutrin  weaning.       Orders Placed This Encounter  Procedures   POC COVID-19   POC Influenza A&B (Binax test)     Follow-up: Return in about 2 weeks (around 02/05/2024) for med check, BP recheck.  An After Visit Summary was printed and given to the patient.  Harrie Cedar, FNP [facility] [phone]      [1] No Known Allergies

## 2024-01-22 NOTE — Assessment & Plan Note (Signed)
 Flu A & B - negative Covid-19 - negative - Continue to take Tylenol  or Motrin for fever  Orders:   POC COVID-19   POC Influenza A&B (Binax test)

## 2024-01-22 NOTE — Telephone Encounter (Signed)
 Reason for Disposition  [1] MILD difficulty breathing (e.g., minimal/no SOB at rest, SOB with walking, pulse < 100) AND [2] still present when not coughing  Answer Assessment - Initial Assessment Questions 1. ONSET: When did the cough begin?      4 days ago  2. SEVERITY: How bad is the cough today?      Severe  3. SPUTUM: Describe the color of your sputum (e.g., none, dry cough; clear, white, yellow, green)     Dry  4. HEMOPTYSIS: Are you coughing up any blood? If Yes, ask: How much? (e.g., flecks, streaks, tablespoons, etc.)     Denies  5. DIFFICULTY BREATHING: Are you having difficulty breathing? If Yes, ask: How bad is it? (e.g., mild, moderate, severe)      Moderate  6. FEVER: Do you have a fever? If Yes, ask: What is your temperature, how was it measured, and when did it start?     99.5 F  7. CARDIAC HISTORY: Do you have any history of heart disease? (e.g., heart attack, congestive heart failure)      Denies  8. LUNG HISTORY: Do you have any history of lung disease?  (e.g., pulmonary embolus, asthma, emphysema)     Denies  9. PE RISK FACTORS: Do you have a history of blood clots? (or: recent major surgery, recent prolonged travel, bedridden)     Denies  10. OTHER SYMPTOMS: Do you have any other symptoms? (e.g., runny nose, wheezing, chest pain)       Sore throat, body aches, chest pain with cough, ear ache both sides  12. TRAVEL: Have you traveled out of the country in the last month? (e.g., travel history, exposures)       Denies  Protocols used: Cough - Acute Non-Productive-A-AH

## 2024-01-22 NOTE — Assessment & Plan Note (Signed)
 Right acute otitis media, erythema of the right TM Ear infection with persistent cough and throat redness. Negative for flu and COVID. - Prescribed Augmentin  twice daily for 7 days. Orders:   amoxicillin -clavulanate (AUGMENTIN ) 875-125 MG tablet; Take 1 tablet by mouth 2 (two) times daily for 7 days.

## 2024-01-22 NOTE — Assessment & Plan Note (Signed)
 Acute upper respiratory infection with right acute otitis media Ear infection with persistent cough and throat redness. Negative for flu and COVID. - Prescribed prednisone  for chest inflammation and cough. - Advised mask use due to fever and contagiousness. Orders:   predniSONE  (DELTASONE ) 20 MG tablet; Take 1 tablet (20 mg total) by mouth daily with breakfast for 5 days.

## 2024-01-22 NOTE — Assessment & Plan Note (Signed)
 Primary hypertension with lower extremity edema Elevated blood pressure and edema noted. Previous discontinuation of hydrochlorothiazide  may have contributed to current symptoms. BP Readings from Last 3 Encounters:  01/22/24 (!) 166/88  10/24/23 (!) 150/76  09/20/23 (!) 164/80  - Prescribed hydrochlorothiazide  12.5 mg in the morning. - Advised follow-up in two weeks to monitor blood pressure. Orders:   hydrochlorothiazide  (HYDRODIURIL ) 12.5 MG tablet; Take 1 tablet (12.5 mg total) by mouth daily.

## 2024-01-22 NOTE — Assessment & Plan Note (Signed)
 Acute cough that keeps her up at night. - Prescribed cough medicine with codeine  for sleep. - Advised mask use due to fever. Orders:   guaiFENesin -codeine  100-10 MG/5ML syrup; Take 5 mLs by mouth 3 (three) times daily as needed.

## 2024-01-22 NOTE — Telephone Encounter (Addendum)
 FYI Only or Action Required?: FYI only for provider: appointment scheduled on 01/22/24.  Patient was last seen in primary care on 10/24/2023 by Nicholaus Credit, PA-C.  Called Nurse Triage reporting Cough.  Symptoms began several days ago.  Interventions attempted: OTC medications: Vicks AM and PM.  Symptoms are: gradually worsening.  Triage Disposition: See HCP Within 4 Hours (Or PCP Triage)  Patient/caregiver understands and will follow disposition?: Yes   Onset sore throat, severe dry cough with some central CP, worse with coughing, generalized body aches 4 days ago. Feeling feverish, temp this morning 99.5 F. Moderate SOB when coughing. Denies respiratory hx. Speaking in clear continuous full sentences. No audible SOB or wheezing noted over the phone. Scheduled appt with different provider at home office today d/t no PCP availability within timeframe. Advised UC or ED for worsening symptoms.    Copied from CRM [id number]. Topic: Clinical - Red Word Triage >> Jan 22, 2024 10:01 AM Charlet HERO wrote: Red Word that prompted transfer to Nurse Triage: patient calling with sore congestion in nose achy all over every joint hurts.  Credit Nicholaus   Cough - Acute Non-Productive-A-AH  1. ONSET: When did the cough begin?      4 days ago  2. SEVERITY: How bad is the cough today?      Severe  3. SPUTUM: Describe the color of your sputum (e.g., none, dry cough; clear, white, yellow, green)     Dry  4. HEMOPTYSIS: Are you coughing up any blood? If Yes, ask: How much? (e.g., flecks, streaks, tablespoons, etc.)     Denies  5. DIFFICULTY BREATHING: Are you having difficulty breathing? If Yes, ask: How bad is it? (e.g., mild, moderate, severe)      Moderate  6. FEVER: Do you have a fever? If Yes, ask: What is your temperature, how was it measured, and when did it start?     99.5 F  7. CARDIAC HISTORY: Do you have any history of heart disease? (e.g., heart attack, congestive heart  failure)      Denies  8. LUNG HISTORY: Do you have any history of lung disease?  (e.g., pulmonary embolus, asthma, emphysema)     Denies  9. PE RISK FACTORS: Do you have a history of blood clots? (or: recent major surgery, recent prolonged travel, bedridden)     Denies  10. OTHER SYMPTOMS: Do you have any other symptoms? (e.g., runny nose, wheezing, chest pain)       Sore throat, body aches, chest pain with cough, ear ache both sides  12. TRAVEL: Have you traveled out of the country in the last month? (e.g., travel history, exposures)       Denies

## 2024-02-05 ENCOUNTER — Ambulatory Visit: Payer: Medicare (Managed Care)

## 2024-03-06 ENCOUNTER — Other Ambulatory Visit: Payer: Self-pay | Admitting: Physician Assistant

## 2024-03-06 DIAGNOSIS — I10 Essential (primary) hypertension: Secondary | ICD-10-CM

## 2024-03-06 DIAGNOSIS — M542 Cervicalgia: Secondary | ICD-10-CM

## 2024-03-06 MED ORDER — HYDROCHLOROTHIAZIDE 12.5 MG PO TABS: 12.5000 mg | ORAL_TABLET | Freq: Every day | ORAL | 1 refills | Status: AC

## 2024-03-06 MED ORDER — CELECOXIB 200 MG PO CAPS: 200.0000 mg | ORAL_CAPSULE | Freq: Every day | ORAL | 1 refills | Status: AC

## 2024-03-06 MED ORDER — AMLODIPINE-OLMESARTAN 10-40 MG PO TABS: 1.0000 | ORAL_TABLET | Freq: Every day | ORAL | 1 refills | Status: AC

## 2024-04-28 ENCOUNTER — Ambulatory Visit: Payer: Medicare (Managed Care) | Admitting: Physician Assistant
